# Patient Record
Sex: Female | Born: 1985 | Race: Asian | Hispanic: No | Marital: Married | Smoking: Never smoker
Health system: Southern US, Community
[De-identification: ages and names within clinical notes are randomized; demographics above are authoritative.]

## PROBLEM LIST (undated history)

## (undated) DIAGNOSIS — Z789 Other specified health status: Secondary | ICD-10-CM

## (undated) DIAGNOSIS — K831 Obstruction of bile duct: Secondary | ICD-10-CM

## (undated) DIAGNOSIS — J45909 Unspecified asthma, uncomplicated: Secondary | ICD-10-CM

## (undated) DIAGNOSIS — O26649 Intrahepatic cholestasis of pregnancy, unspecified trimester: Secondary | ICD-10-CM

## (undated) DIAGNOSIS — O26619 Liver and biliary tract disorders in pregnancy, unspecified trimester: Secondary | ICD-10-CM

## (undated) HISTORY — PX: NO PAST SURGERIES: SHX2092

## (undated) HISTORY — DX: Obstruction of bile duct: K83.1

## (undated) HISTORY — DX: Intrahepatic cholestasis of pregnancy, unspecified trimester: O26.649

## (undated) HISTORY — DX: Liver and biliary tract disorders in pregnancy, unspecified trimester: O26.619

---

## 2010-03-07 NOTE — L&D Delivery Note (Signed)
Delivery Note At 12:54 PM a viable, healthy and pre-term female was delivered via Vaginal, Spontaneous Delivery (Presentation: ; Occiput Anterior).  APGAR: 8, 9; weight 3 lb 4.6 oz (1491 g).  NICU team attended delivery and evaluated baby immediately upon delivery.  Placenta status: Intact, SpontaneousPathology.  Cord: 3 vessels with the following complications: None.    Anesthesia: Epidural  Episiotomy: None Lacerations: Periurethral with hemostasis  Suture Repair: Not Indicated Est. Blood Loss (mL): 300 mL   Mom to postpartum.  Baby to NICU.  Clinton Sawyer, Hannalee Castor 12/24/2010, 9:44 AM

## 2010-12-21 ENCOUNTER — Inpatient Hospital Stay (HOSPITAL_COMMUNITY)
Admission: AD | Admit: 2010-12-21 | Discharge: 2010-12-25 | DRG: 775 | Disposition: A | Discharge status: Home / Self Care | Payer: Medicaid Other | Source: Ambulatory Visit | Attending: Family Medicine | Admitting: Family Medicine

## 2010-12-21 ENCOUNTER — Encounter (HOSPITAL_COMMUNITY): Payer: Self-pay | Admitting: *Deleted

## 2010-12-21 ENCOUNTER — Inpatient Hospital Stay (HOSPITAL_COMMUNITY): Payer: Medicaid Other

## 2010-12-21 DIAGNOSIS — K838 Other specified diseases of biliary tract: Secondary | ICD-10-CM | POA: Diagnosis present

## 2010-12-21 DIAGNOSIS — L299 Pruritus, unspecified: Secondary | ICD-10-CM | POA: Diagnosis present

## 2010-12-21 DIAGNOSIS — O4290 Premature rupture of membranes, unspecified as to length of time between rupture and onset of labor, unspecified weeks of gestation: Principal | ICD-10-CM | POA: Diagnosis present

## 2010-12-21 DIAGNOSIS — O4700 False labor before 37 completed weeks of gestation, unspecified trimester: Secondary | ICD-10-CM

## 2010-12-21 DIAGNOSIS — O0930 Supervision of pregnancy with insufficient antenatal care, unspecified trimester: Secondary | ICD-10-CM

## 2010-12-21 DIAGNOSIS — O26899 Other specified pregnancy related conditions, unspecified trimester: Secondary | ICD-10-CM | POA: Diagnosis present

## 2010-12-21 HISTORY — DX: Other specified health status: Z78.9

## 2010-12-21 LAB — COMPREHENSIVE METABOLIC PANEL
AST: 51 U/L — ABNORMAL HIGH (ref 0–37)
Albumin: 2.1 g/dL — ABNORMAL LOW (ref 3.5–5.2)
Chloride: 100 mEq/L (ref 96–112)
Creatinine, Ser: <0.47 mg/dL — ABNORMAL LOW (ref 0.50–1.10)
Potassium: 3.7 mEq/L (ref 3.5–5.1)
Total Bilirubin: 2.3 mg/dL — ABNORMAL HIGH (ref 0.3–1.2)
Total Protein: 6.2 g/dL (ref 6.0–8.3)

## 2010-12-21 LAB — DIFFERENTIAL
Basophils Relative: 1 % (ref 0–1)
Eosinophils Absolute: 0.2 10*3/uL (ref 0.0–0.7)
Monocytes Absolute: 0.6 10*3/uL (ref 0.1–1.0)
Monocytes Relative: 5 % (ref 3–12)

## 2010-12-21 LAB — GLUCOSE TOLERANCE, 1 HOUR: Glucose, 1 Hour GTT: 137 mg/dL (ref 70–140)

## 2010-12-21 LAB — URINALYSIS, ROUTINE W REFLEX MICROSCOPIC
Leukocytes, UA: NEGATIVE
Nitrite: NEGATIVE
Urobilinogen, UA: 0.2 mg/dL (ref 0.0–1.0)

## 2010-12-21 LAB — WET PREP, GENITAL
Clue Cells Wet Prep HPF POC: NONE SEEN
Trich, Wet Prep: NONE SEEN
WBC, Wet Prep HPF POC: NONE SEEN
Yeast Wet Prep HPF POC: NONE SEEN

## 2010-12-21 LAB — CBC
HCT: 37.1 % (ref 36.0–46.0)
Hemoglobin: 12 g/dL (ref 12.0–15.0)
MCH: 26.1 pg (ref 26.0–34.0)
MCHC: 32.3 g/dL (ref 30.0–36.0)
MCV: 80.8 fL (ref 78.0–100.0)

## 2010-12-21 LAB — HIV ANTIBODY (ROUTINE TESTING W REFLEX): HIV: NONREACTIVE

## 2010-12-21 LAB — MAGNESIUM: Magnesium: 7.9 mg/dL (ref 1.5–2.5)

## 2010-12-21 LAB — URINE MICROSCOPIC-ADD ON

## 2010-12-21 MED ORDER — PRENATAL PLUS 27-1 MG PO TABS
1.0000 | ORAL_TABLET | Freq: Every day | ORAL | Status: DC
  Administered 2010-12-22: 1 via ORAL

## 2010-12-21 MED ORDER — LACTATED RINGERS IV SOLN
INTRAVENOUS | Status: DC
  Administered 2010-12-21 – 2010-12-22 (×4): via INTRAVENOUS
  Administered 2010-12-23: 300 mL via INTRAVENOUS

## 2010-12-21 MED ORDER — ACETAMINOPHEN 325 MG PO TABS: 650.0000 mg | ORAL_TABLET | ORAL | Status: DC | PRN

## 2010-12-21 MED ORDER — LACTATED RINGERS IV BOLUS (SEPSIS)
1000.0000 mL | Freq: Once | INTRAVENOUS | Status: AC
  Administered 2010-12-21: 1000 mL via INTRAVENOUS

## 2010-12-21 MED ORDER — BETAMETHASONE SOD PHOS & ACET 6 (3-3) MG/ML IJ SUSP
12.5000 mg | Freq: Once | INTRAMUSCULAR | Status: AC
  Administered 2010-12-21: 12.5 mg via INTRAMUSCULAR
  Filled 2010-12-21: qty 2.1

## 2010-12-21 MED ORDER — MAGNESIUM SULFATE BOLUS VIA INFUSION
4.0000 g | Freq: Once | INTRAVENOUS | Status: AC
  Administered 2010-12-21: 4 g via INTRAVENOUS
  Filled 2010-12-21: qty 500

## 2010-12-21 MED ORDER — ZOLPIDEM TARTRATE 10 MG PO TABS: 10.0000 mg | ORAL_TABLET | Freq: Every evening | ORAL | Status: DC | PRN

## 2010-12-21 MED ORDER — MAGNESIUM SULFATE 40 G IN LACTATED RINGERS - SIMPLE
2.0000 g/h | INTRAVENOUS | Status: DC
  Administered 2010-12-21: 3 g/h via INTRAVENOUS
  Administered 2010-12-21 – 2010-12-22 (×2): 2 g/h via INTRAVENOUS
  Filled 2010-12-21 (×3): qty 500

## 2010-12-21 MED ORDER — BUTORPHANOL TARTRATE 2 MG/ML IJ SOLN
1.0000 mg | Freq: Once | INTRAMUSCULAR | Status: AC
  Administered 2010-12-21: 1 mg via INTRAVENOUS
  Filled 2010-12-21: qty 1

## 2010-12-21 MED ORDER — DOCUSATE SODIUM 100 MG PO CAPS
100.0000 mg | ORAL_CAPSULE | Freq: Every day | ORAL | Status: DC
  Administered 2010-12-22: 100 mg via ORAL

## 2010-12-21 MED ORDER — CALCIUM CARBONATE ANTACID 500 MG PO CHEW: 2.0000 | CHEWABLE_TABLET | ORAL | Status: DC | PRN

## 2010-12-21 MED ORDER — BETAMETHASONE SOD PHOS & ACET 6 (3-3) MG/ML IJ SUSP
12.5000 mg | Freq: Once | INTRAMUSCULAR | Status: AC
  Administered 2010-12-22: 12.5 mg via INTRAMUSCULAR
  Filled 2010-12-21: qty 2.1

## 2010-12-21 NOTE — ED Provider Notes (Signed)
Toy Manichote25 y.o.G1P0 @[redacted]w[redacted]d by LMP Chief Complaint  Patient presents with  . Contractions    HPI  Onset intermittent UCs with red bloody and mucusy discharge yesterday. Presents tonight because UCs became painful at 2200 12/20/10. No LOF. Good FM.  Also having generalized pruritis trunk, arms and legs sometimes severe enough to interrupt sleep since August. [place] couple with hx mostly from husband.  Pregnancy course: NPC. Denies pregnancy problems before yesterday.  Past Medical History  Diagnosis Date  . No pertinent past medical history     Past Surgical History  Procedure Date  . No past surgeries    History   Social History  . Marital Status: Married    Spouse Name: N/A    Number of Children: N/A  . Years of Education: N/A   Occupational History  . Not on file.   Social History Main Topics  . Smoking status: Not on file  . Smokeless tobacco: Not on file  . Alcohol Use: No  . Drug Use: No  . Sexually Active: Yes   Other Topics Concern  . Not on file   Social History Narrative  . No narrative on file   No current facility-administered medications on file prior to encounter.   No current outpatient prescriptions on file prior to encounter.   Allergies not on file  ROS: Pertinent items in HPI  OBJECTIVE  BP 115/80  Pulse 82  Temp(Src) 97.7 F (36.5 C) (Oral)  Resp 18  Ht 5' (1.524 m)  Wt 55.792 kg (123 lb)  BMI 24.02 kg/m2  LMP 06/19/2010  Physical Examination: General appearance - alert, well appearing, and in no distress. Looks in mild discomfort with UCs Eyes - pupils equal and reactive, extraocular eye movements intact Neck - supple, no significant adenopathy, thyroid exam: thyroid is normal in size without nodules or tenderness Lymphatics - no palpable lymphadenopathy Chest - clear to auscultation, no wheezes, rales or rhonchi, symmetric air entry Heart - normal rate, regular rhythm, normal S1, S2, no murmurs, rubs, clicks or  gallops Abdomen - soft, nontender, nondistended, no masses or organomegaly Neurological - alert, oriented, normal speech, no focal findings or movement disorder noted, DTR's normal and symmetric Musculoskeletal - no joint tenderness, deformity or swelling Extremities - peripheral pulses normal, no pedal edema, no clubbing or cyanosis Skin - normal coloration and turgor, no rashes, no suspicious skin lesions noted DERMATITIS NOTED: acne on abdomen, mild excoriations on forearms bilaterally without bleeding  Pelvic:  NEFG, no blood or fluid SSE: scant brown blood in vault. No vag or cx lesion seen. Cx: 1/80/-1 vtx   EFM Toco: Ucs q 2-4 min x 40-60 sec FHR: 140 baseline, 10 bpm accels, mod variability, occ mild deceleration   MAU course: IVF bolus did not impact UCs  ASSESSMENT 1. NPC @ 30.6 wk 2. PTL 3. Generalized pruritis    PLAN Labs sent Admit for Magnesium tocolysis and beta methasone. D/W Dr. Pratt.   

## 2010-12-21 NOTE — ED Provider Notes (Signed)
Chart reviewed and agree with management and plan.  

## 2010-12-21 NOTE — H&P (Signed)
Dawn Manichote25 y.o.G1P0 @[redacted]w[redacted]d  by LMP Chief Complaint  Patient presents with  . Contractions    HPI  Onset intermittent UCs with red bloody and mucusy discharge yesterday. Presents tonight because UCs became painful at 2200 12/20/10. No LOF. Good FM.  Also having generalized pruritis trunk, arms and legs sometimes severe enough to interrupt sleep since August. [place] couple with hx mostly from husband.  Pregnancy course: NPC. Denies pregnancy problems before yesterday.  Past Medical History  Diagnosis Date  . No pertinent past medical history     Past Surgical History  Procedure Date  . No past surgeries    History   Social History  . Marital Status: Married    Spouse Name: N/A    Number of Children: N/A  . Years of Education: N/A   Occupational History  . Not on file.   Social History Main Topics  . Smoking status: Not on file  . Smokeless tobacco: Not on file  . Alcohol Use: No  . Drug Use: No  . Sexually Active: Yes   Other Topics Concern  . Not on file   Social History Narrative  . No narrative on file   No current facility-administered medications on file prior to encounter.   No current outpatient prescriptions on file prior to encounter.   Allergies not on file  ROS: Pertinent items in HPI  OBJECTIVE  BP 115/80  Pulse 82  Temp(Src) 97.7 F (36.5 C) (Oral)  Resp 18  Ht 5' (1.524 m)  Wt 55.792 kg (123 lb)  BMI 24.02 kg/m2  LMP 06/19/2010  Physical Examination: General appearance - alert, well appearing, and in no distress. Looks in mild discomfort with UCs Eyes - pupils equal and reactive, extraocular eye movements intact Neck - supple, no significant adenopathy, thyroid exam: thyroid is normal in size without nodules or tenderness Lymphatics - no palpable lymphadenopathy Chest - clear to auscultation, no wheezes, rales or rhonchi, symmetric air entry Heart - normal rate, regular rhythm, normal S1, S2, no murmurs, rubs, clicks or  gallops Abdomen - soft, nontender, nondistended, no masses or organomegaly Neurological - alert, oriented, normal speech, no focal findings or movement disorder noted, DTR's normal and symmetric Musculoskeletal - no joint tenderness, deformity or swelling Extremities - peripheral pulses normal, no pedal edema, no clubbing or cyanosis Skin - normal coloration and turgor, no rashes, no suspicious skin lesions noted DERMATITIS NOTED: acne on abdomen, mild excoriations on forearms bilaterally without bleeding  Pelvic:  NEFG, no blood or fluid SSE: scant brown blood in vault. No vag or cx lesion seen. Cx: 1/80/-1 vtx   EFM Toco: Ucs q 2-4 min x 40-60 sec FHR: 140 baseline, 10 bpm accels, mod variability, occ mild deceleration   MAU course: IVF bolus did not impact UCs  ASSESSMENT 1. NPC @ 30.6 wk 2. PTL 3. Generalized pruritis    PLAN Labs sent Admit for Magnesium tocolysis and beta methasone. D/W Dr. Shawnie Pons.

## 2010-12-21 NOTE — H&P (Signed)
Chart reviewed and agree with management and plan.  

## 2010-12-21 NOTE — Progress Notes (Signed)
UR Chart review completed.  

## 2010-12-21 NOTE — Progress Notes (Signed)
SUBJECTIVE: Sleeping comfortably. Rec'd Stadol 1 mg before magnesium started.  OBJECTIVE: Filed Vitals:   12/21/10 0700  BP: 101/54  Pulse: 85  Temp:   Resp: 18    EFM: FHR 125, reactive           Toco: UCs diminished to irreg q 6-8  MgSO4 infusing S/P BMZ #1 Glucola was drawn 2 hr 20 min after drink and afrter BMZ was given  [place] report pending Results for orders placed during the hospital encounter of 12/21/10 (from the past 24 hour(s))  URINALYSIS, ROUTINE W REFLEX MICROSCOPIC     Status: Abnormal   Collection Time   12/21/10  2:00 AM      Component Value Range   Color, Urine YELLOW  YELLOW    Appearance CLEAR  CLEAR    Specific Gravity, Urine 1.020  1.005 - 1.030    pH 8.0  5.0 - 8.0    Glucose, UA NEGATIVE  NEGATIVE (mg/dL)   Hgb urine dipstick TRACE (*) NEGATIVE    Bilirubin Urine SMALL (*) NEGATIVE    Ketones, ur NEGATIVE  NEGATIVE (mg/dL)   Protein, ur NEGATIVE  NEGATIVE (mg/dL)   Urobilinogen, UA 0.2  0.0 - 1.0 (mg/dL)   Nitrite NEGATIVE  NEGATIVE    Leukocytes, UA NEGATIVE  NEGATIVE   URINE MICROSCOPIC-ADD ON     Status: Normal   Collection Time   12/21/10  2:00 AM      Component Value Range   Squamous Epithelial / LPF RARE  RARE    RBC / HPF 0-2  <3 (RBC/hpf)   Urine-Other MUCOUS PRESENT    HEPATITIS B SURFACE ANTIGEN     Status: Normal   Collection Time   12/21/10  2:50 AM      Component Value Range   Hepatitis B Surface Ag NEGATIVE  NEGATIVE   RUBELLA SCREEN     Status: Abnormal   Collection Time   12/21/10  2:50 AM      Component Value Range   Rubella 66.4 (*)   RPR     Status: Normal   Collection Time   12/21/10  2:50 AM      Component Value Range   RPR NON REACTIVE  NON REACTIVE   CBC     Status: Abnormal   Collection Time   12/21/10  2:50 AM      Component Value Range   WBC 12.7 (*) 4.0 - 10.5 (K/uL)   RBC 4.59  3.87 - 5.11 (MIL/uL)   Hemoglobin 12.0  12.0 - 15.0 (g/dL)   HCT 16.1  09.6 - 04.5 (%)   MCV 80.8  78.0 - 100.0 (fL)   MCH  26.1  26.0 - 34.0 (pg)   MCHC 32.3  30.0 - 36.0 (g/dL)   RDW 40.9  81.1 - 91.4 (%)   Platelets 352  150 - 400 (K/uL)  DIFFERENTIAL     Status: Abnormal   Collection Time   12/21/10  2:50 AM      Component Value Range   Neutrophils Relative 77  43 - 77 (%)   Neutro Abs 9.7 (*) 1.7 - 7.7 (K/uL)   Lymphocytes Relative 16  12 - 46 (%)   Lymphs Abs 2.0  0.7 - 4.0 (K/uL)   Monocytes Relative 5  3 - 12 (%)   Monocytes Absolute 0.6  0.1 - 1.0 (K/uL)   Eosinophils Relative 2  0 - 5 (%)   Eosinophils Absolute 0.2  0.0 - 0.7 (K/uL)  Basophils Relative 1  0 - 1 (%)   Basophils Absolute 0.1  0.0 - 0.1 (K/uL)  ABO/RH     Status: Normal   Collection Time   12/21/10  2:50 AM      Component Value Range   ABO/RH(D) B POS    HIV ANTIBODY     Status: Normal   Collection Time   12/21/10  2:50 AM      Component Value Range   HIV NON REACTIVE  NON REACTIVE   COMPREHENSIVE METABOLIC PANEL     Status: Abnormal   Collection Time   12/21/10  2:50 AM      Component Value Range   Sodium 135  135 - 145 (mEq/L)   Potassium 3.7  3.5 - 5.1 (mEq/L)   Chloride 100  96 - 112 (mEq/L)   CO2 24  19 - 32 (mEq/L)   Glucose, Bld 91  70 - 99 (mg/dL)   BUN 5 (*) 6 - 23 (mg/dL)   Creatinine, Ser <9.60 (*) 0.50 - 1.10 (mg/dL)   Calcium 9.2  8.4 - 45.4 (mg/dL)   Total Protein 6.2  6.0 - 8.3 (g/dL)   Albumin 2.1 (*) 3.5 - 5.2 (g/dL)   AST 51 (*) 0 - 37 (U/L)   ALT 122 (*) 0 - 35 (U/L)   Alkaline Phosphatase 292 (*) 39 - 117 (U/L)   Total Bilirubin 2.3 (*) 0.3 - 1.2 (mg/dL)   GFR calc non Af Amer NOT CALCULATED  >90 (mL/min)   GFR calc Af Amer NOT CALCULATED  >90 (mL/min)  WET PREP, GENITAL     Status: Normal   Collection Time   12/21/10  3:50 AM      Component Value Range   Yeast, Wet Prep NONE SEEN  NONE SEEN    Trich, Wet Prep NONE SEEN  NONE SEEN    Clue Cells, Wet Prep NONE SEEN  NONE SEEN    WBC, Wet Prep HPF POC NONE SEEN  NONE SEEN   GLUCOSE TOLERANCE, 1 HOUR     Status: Normal   Collection Time    12/21/10  6:25 AM      Component Value Range   Glucose, 1 Hour GTT 137  70 - 140 (mg/dL)  ASSESSMENT: PTL, now with diminished uterine activity. PLAN: Continue present management. Dr. Shawnie Pons aware

## 2010-12-21 NOTE — Progress Notes (Signed)
Pt reports she has been having contractions since yesterday, a little bleeding.

## 2010-12-22 ENCOUNTER — Encounter (HOSPITAL_COMMUNITY): Payer: Self-pay | Admitting: *Deleted

## 2010-12-22 LAB — URINE DRUGS OF ABUSE SCREEN W ALC, ROUTINE (REF LAB)
Amphetamine Screen, Ur: NEGATIVE
Barbiturate Quant, Ur: NEGATIVE
Benzodiazepines.: NEGATIVE
Cocaine Metabolites: NEGATIVE
Creatinine,U: 24.3 mg/dL
Ethyl Alcohol: <10 mg/dL
Marijuana Metabolite: NEGATIVE
Methadone: NEGATIVE
Opiate Screen, Urine: NEGATIVE
Phencyclidine (PCP): NEGATIVE
Propoxyphene: NEGATIVE

## 2010-12-22 LAB — URINE CULTURE
Colony Count: NO GROWTH
Culture: NO GROWTH

## 2010-12-22 LAB — GC/CHLAMYDIA PROBE AMP, URINE
Chlamydia, Swab/Urine, PCR: NEGATIVE
GC Probe Amp, Urine: NEGATIVE

## 2010-12-22 LAB — HEMOGLOBINOPATHY EVALUATION
Hemoglobin Other: 0 % (ref 0.0–0.0)
Hgb A: 97.1 % (ref 96.8–97.8)

## 2010-12-22 MED ORDER — SODIUM CHLORIDE 0.9 % IV SOLN
2.0000 g | Freq: Four times a day (QID) | INTRAVENOUS | Status: DC
  Administered 2010-12-22: 2 g via INTRAVENOUS
  Filled 2010-12-22 (×2): qty 2000

## 2010-12-22 MED ORDER — ONDANSETRON HCL 4 MG/2ML IJ SOLN: 4.0000 mg | Freq: Four times a day (QID) | INTRAMUSCULAR | Status: DC | PRN

## 2010-12-22 MED ORDER — AMPICILLIN 500 MG PO CAPS: 500.0000 mg | ORAL_CAPSULE | Freq: Three times a day (TID) | ORAL | Status: DC

## 2010-12-22 MED ORDER — DEXTROSE 5 % IV SOLN
5.0000 10*6.[IU] | Freq: Once | INTRAVENOUS | Status: AC
  Administered 2010-12-22: 5 10*6.[IU] via INTRAVENOUS
  Filled 2010-12-22: qty 5

## 2010-12-22 MED ORDER — AMPICILLIN SODIUM 2 G IJ SOLR: 2.0000 g | Freq: Four times a day (QID) | INTRAMUSCULAR | Status: DC

## 2010-12-22 MED ORDER — PENICILLIN G POTASSIUM 5000000 UNITS IJ SOLR
2.5000 10*6.[IU] | INTRAVENOUS | Status: DC
  Administered 2010-12-23 (×3): 2.5 10*6.[IU] via INTRAVENOUS
  Filled 2010-12-22 (×7): qty 2.5

## 2010-12-22 MED ORDER — ONDANSETRON HCL 4 MG/2ML IJ SOLN
INTRAMUSCULAR | Status: AC
  Administered 2010-12-22: 04:00:00
  Filled 2010-12-22: qty 2

## 2010-12-22 MED ORDER — ERYTHROMYCIN LACTOBIONATE 500 MG IV SOLR: 500.0000 mg | Freq: Four times a day (QID) | INTRAVENOUS | Status: DC

## 2010-12-22 MED ORDER — SODIUM CHLORIDE 0.9 % IV SOLN
500.0000 mg | Freq: Four times a day (QID) | INTRAVENOUS | Status: DC
  Administered 2010-12-22: 500 mg via INTRAVENOUS
  Filled 2010-12-22 (×2): qty 500

## 2010-12-22 MED ORDER — NALBUPHINE SYRINGE 5 MG/0.5 ML
5.0000 mg | INJECTION | INTRAMUSCULAR | Status: DC | PRN
  Administered 2010-12-23: 5 mg via INTRAVENOUS
  Filled 2010-12-22 (×3): qty 0.5

## 2010-12-22 MED ORDER — ERYTHROMYCIN BASE 250 MG PO TBEC: 500.0000 mg | DELAYED_RELEASE_TABLET | Freq: Three times a day (TID) | ORAL | Status: DC

## 2010-12-22 NOTE — Progress Notes (Signed)
  Patient is reporting increase in her pain. Continues to leak fluid. COntinues to contract. Dilation: 2 Effacement (%): 100 Station: -1 Presentation: Vertex Exam by:: Dr Natale Milch  FHTs: 120s-130s; + Accels, possible early decels; contractions q2-5 min  A/P PPROM, laboring. Will move to L&D and start PCN. NICU notified.

## 2010-12-22 NOTE — Progress Notes (Signed)
SW received consult to meet with patient due to [facility].  SW met with patient and asked if she would like SW to get an interpreter.  She spoke [place] and said that SW could talk with her.  SW verified understanding by having patient repeat information back to SW.  She states she did not have PNC due to lack of insurance.  She has met with [facility] financial counselor to apply for emergency Medicaid.  SW explained to patient that if she is discharged from the hospital before baby is born, she will become a clinic patient and given an appointment before leaving the hospital.  Patient states that she has transportation from her husband or friends to get to appointments.  SW offered bus passes just in case she did not have transportation and she accepted.  SW will give her 2 bus passes and explained that she may request more if needed.  SW gave contact information and asked her to have her husband call me if there is anything SW needs to clarify or assist with.

## 2010-12-22 NOTE — Progress Notes (Signed)
Communication with pt. [facility] [place] - telephone interpreter.

## 2010-12-22 NOTE — Progress Notes (Signed)
Dr. Natale Milch to the bedside - positive fern; orders received - will not send amnisure to lab.

## 2010-12-22 NOTE — Progress Notes (Addendum)
Called to pt.'s room, RLQ discomfort, pt. Voided/ possible SROM on towel (between her legs).  Amniotest blue/yellow.  Dr. Despina Hidden notified; Dr. Natale Milch will come to the unit to evaluate patient.  FHR 130's, no vagional bleeding noted, yellowish color fluid (urine?).

## 2010-12-22 NOTE — Progress Notes (Addendum)
  S. Contraction pain decreased today compared to yesterday. She denies VB or ROM.   O. VSS, AF, RR 20      FHR- reassuring      CTX- now q 8 minutes      DTR-0      Abd- gravid, benign  A/P. PTL at 31.1 weeks with no measurable cervix on u/s- She is now status post BMZ and is on 2 grams per hour of magnesium sulfate.  She is stable at present.

## 2010-12-22 NOTE — Progress Notes (Signed)
Dr. Natale Milch at the bedside for spec. Exam and amnisure test.

## 2010-12-22 NOTE — Progress Notes (Signed)
Transferred to labor and delivery,room 160

## 2010-12-22 NOTE — Progress Notes (Signed)
Patient felt "automatic" leakage of fluid, and is unsure if it was urine or water breaking.  Speculum exam revealed pooling of light green/darker yellow fluid and some spotting noted on cervix. Sample taken with sterile swab and fern was positive. Cervix by visual exam was 1 cm; no digital exam was performed.  US showed head down at bedside.  A/P Preterm, Premature ROM 1. Stop Magnesium 2. Start Amp 2 grams IV q6 x 2 days followed by 500mg  q8 x 5 days 3. Start Erythromycin IV 500mg  q6 x 2 days followed by 500mg  q8 x 5 days 4. GBS is pending 5. Discussed with patient that if she develops infection or continues to go into labor, she will be delivered and NICU will be attending delivery; infant will likely have a NICU stay.  Used [employer]

## 2010-12-22 NOTE — Progress Notes (Signed)
Pt. Off monitor to BR for void.  

## 2010-12-23 ENCOUNTER — Inpatient Hospital Stay (HOSPITAL_COMMUNITY): Payer: Medicaid Other | Admitting: Anesthesiology

## 2010-12-23 ENCOUNTER — Other Ambulatory Visit: Payer: Self-pay | Admitting: Family Medicine

## 2010-12-23 ENCOUNTER — Encounter (HOSPITAL_COMMUNITY): Payer: Self-pay | Admitting: *Deleted

## 2010-12-23 ENCOUNTER — Encounter (HOSPITAL_COMMUNITY): Payer: Self-pay | Admitting: Anesthesiology

## 2010-12-23 DIAGNOSIS — O4700 False labor before 37 completed weeks of gestation, unspecified trimester: Secondary | ICD-10-CM

## 2010-12-23 LAB — CBC
HCT: 32.6 % — ABNORMAL LOW (ref 36.0–46.0)
MCHC: 32.8 g/dL (ref 30.0–36.0)
MCV: 80.3 fL (ref 78.0–100.0)
Platelets: 373 10*3/uL (ref 150–400)
RDW: 14.4 % (ref 11.5–15.5)
WBC: 13.3 10*3/uL — ABNORMAL HIGH (ref 4.0–10.5)

## 2010-12-23 MED ORDER — LIDOCAINE HCL (PF) 1 % IJ SOLN
30.0000 mL | INTRAMUSCULAR | Status: DC | PRN
  Filled 2010-12-23: qty 30

## 2010-12-23 MED ORDER — LACTATED RINGERS IV SOLN
500.0000 mL | INTRAVENOUS | Status: DC | PRN
  Administered 2010-12-23: 500 mL via INTRAVENOUS
  Administered 2010-12-23: 300 mL via INTRAVENOUS

## 2010-12-23 MED ORDER — OXYTOCIN BOLUS FROM INFUSION
500.0000 mL | Freq: Once | INTRAVENOUS | Status: DC
  Filled 2010-12-23: qty 1000
  Filled 2010-12-23: qty 500

## 2010-12-23 MED ORDER — PRENATAL PLUS 27-1 MG PO TABS
1.0000 | ORAL_TABLET | Freq: Every day | ORAL | Status: DC
  Administered 2010-12-24 – 2010-12-25 (×2): 1 via ORAL
  Filled 2010-12-23 (×2): qty 1

## 2010-12-23 MED ORDER — OXYTOCIN 20 UNITS IN LACTATED RINGERS INFUSION - SIMPLE
1.0000 m[IU]/min | INTRAVENOUS | Status: DC
  Administered 2010-12-23: 333 m[IU]/min via INTRAVENOUS
  Administered 2010-12-23: 1 m[IU]/min via INTRAVENOUS
  Filled 2010-12-23: qty 1000

## 2010-12-23 MED ORDER — SIMETHICONE 80 MG PO CHEW
80.0000 mg | CHEWABLE_TABLET | ORAL | Status: DC | PRN
  Administered 2010-12-25: 80 mg via ORAL

## 2010-12-23 MED ORDER — DIPHENHYDRAMINE HCL 25 MG PO CAPS
25.0000 mg | ORAL_CAPSULE | Freq: Four times a day (QID) | ORAL | Status: DC | PRN
  Administered 2010-12-24 – 2010-12-25 (×3): 25 mg via ORAL
  Filled 2010-12-23 (×3): qty 1

## 2010-12-23 MED ORDER — FENTANYL 2.5 MCG/ML BUPIVACAINE 1/10 % EPIDURAL INFUSION (WH - ANES)
INTRAMUSCULAR | Status: DC | PRN
  Administered 2010-12-23: 12 mL/h via EPIDURAL

## 2010-12-23 MED ORDER — CITRIC ACID-SODIUM CITRATE 334-500 MG/5ML PO SOLN: 30.0000 mL | ORAL | Status: DC | PRN

## 2010-12-23 MED ORDER — DIPHENHYDRAMINE HCL 50 MG/ML IJ SOLN: 12.5000 mg | INTRAMUSCULAR | Status: DC | PRN

## 2010-12-23 MED ORDER — ONDANSETRON HCL 4 MG PO TABS: 4.0000 mg | ORAL_TABLET | ORAL | Status: DC | PRN

## 2010-12-23 MED ORDER — SENNOSIDES-DOCUSATE SODIUM 8.6-50 MG PO TABS
2.0000 | ORAL_TABLET | Freq: Every day | ORAL | Status: DC
Start: 2010-12-23 — End: 2010-12-25
  Administered 2010-12-23: 2 via ORAL

## 2010-12-23 MED ORDER — LACTATED RINGERS IV SOLN
INTRAVENOUS | Status: DC
  Administered 2010-12-23 (×2): via INTRAVENOUS

## 2010-12-23 MED ORDER — WITCH HAZEL-GLYCERIN EX PADS: 1.0000 "application " | MEDICATED_PAD | CUTANEOUS | Status: DC | PRN

## 2010-12-23 MED ORDER — LACTATED RINGERS IV SOLN: 500.0000 mL | Freq: Once | INTRAVENOUS | Status: DC

## 2010-12-23 MED ORDER — EPHEDRINE 5 MG/ML INJ
10.0000 mg | INTRAVENOUS | Status: DC | PRN
  Filled 2010-12-23 (×2): qty 4

## 2010-12-23 MED ORDER — OXYCODONE-ACETAMINOPHEN 5-325 MG PO TABS: 1.0000 | ORAL_TABLET | ORAL | Status: DC | PRN

## 2010-12-23 MED ORDER — EPHEDRINE 5 MG/ML INJ
10.0000 mg | INTRAVENOUS | Status: DC | PRN
  Filled 2010-12-23: qty 4

## 2010-12-23 MED ORDER — PHENYLEPHRINE 40 MCG/ML (10ML) SYRINGE FOR IV PUSH (FOR BLOOD PRESSURE SUPPORT)
80.0000 ug | PREFILLED_SYRINGE | INTRAVENOUS | Status: DC | PRN
  Filled 2010-12-23 (×2): qty 5

## 2010-12-23 MED ORDER — TETANUS-DIPHTH-ACELL PERTUSSIS 5-2.5-18.5 LF-MCG/0.5 IM SUSP
0.5000 mL | Freq: Once | INTRAMUSCULAR | Status: AC
  Administered 2010-12-24: 0.5 mL via INTRAMUSCULAR
  Filled 2010-12-23: qty 0.5

## 2010-12-23 MED ORDER — LANOLIN HYDROUS EX OINT: TOPICAL_OINTMENT | CUTANEOUS | Status: DC | PRN

## 2010-12-23 MED ORDER — IBUPROFEN 600 MG PO TABS
600.0000 mg | ORAL_TABLET | Freq: Four times a day (QID) | ORAL | Status: DC
  Administered 2010-12-23 – 2010-12-25 (×8): 600 mg via ORAL
  Filled 2010-12-23 (×8): qty 1

## 2010-12-23 MED ORDER — OXYTOCIN 20 UNITS IN LACTATED RINGERS INFUSION - SIMPLE: 125.0000 mL/h | Freq: Once | INTRAVENOUS | Status: DC

## 2010-12-23 MED ORDER — DIBUCAINE 1 % RE OINT: 1.0000 "application " | TOPICAL_OINTMENT | RECTAL | Status: DC | PRN

## 2010-12-23 MED ORDER — IBUPROFEN 600 MG PO TABS: 600.0000 mg | ORAL_TABLET | Freq: Four times a day (QID) | ORAL | Status: DC | PRN

## 2010-12-23 MED ORDER — ZOLPIDEM TARTRATE 5 MG PO TABS: 5.0000 mg | ORAL_TABLET | Freq: Every evening | ORAL | Status: DC | PRN

## 2010-12-23 MED ORDER — PHENYLEPHRINE 40 MCG/ML (10ML) SYRINGE FOR IV PUSH (FOR BLOOD PRESSURE SUPPORT)
80.0000 ug | PREFILLED_SYRINGE | INTRAVENOUS | Status: DC | PRN
  Filled 2010-12-23: qty 5

## 2010-12-23 MED ORDER — ONDANSETRON HCL 4 MG/2ML IJ SOLN: 4.0000 mg | Freq: Four times a day (QID) | INTRAMUSCULAR | Status: DC | PRN

## 2010-12-23 MED ORDER — BENZOCAINE-MENTHOL 20-0.5 % EX AERO
INHALATION_SPRAY | CUTANEOUS | Status: AC
  Filled 2010-12-23: qty 56

## 2010-12-23 MED ORDER — BENZOCAINE-MENTHOL 20-0.5 % EX AERO: 1.0000 "application " | INHALATION_SPRAY | CUTANEOUS | Status: DC | PRN

## 2010-12-23 MED ORDER — TERBUTALINE SULFATE 1 MG/ML IJ SOLN: 0.2500 mg | Freq: Once | INTRAMUSCULAR | Status: DC | PRN

## 2010-12-23 MED ORDER — FLEET ENEMA 7-19 GM/118ML RE ENEM: 1.0000 | ENEMA | RECTAL | Status: DC | PRN

## 2010-12-23 MED ORDER — LIDOCAINE HCL 1.5 % IJ SOLN
INTRAMUSCULAR | Status: DC | PRN
  Administered 2010-12-23: 3 mL via INTRADERMAL
  Administered 2010-12-23: 4 mL via INTRADERMAL

## 2010-12-23 MED ORDER — FENTANYL 2.5 MCG/ML BUPIVACAINE 1/10 % EPIDURAL INFUSION (WH - ANES)
14.0000 mL/h | INTRAMUSCULAR | Status: DC
  Administered 2010-12-23: 14 mL/h via EPIDURAL
  Filled 2010-12-23 (×2): qty 60

## 2010-12-23 MED ORDER — ONDANSETRON HCL 4 MG/2ML IJ SOLN: 4.0000 mg | INTRAMUSCULAR | Status: DC | PRN

## 2010-12-23 NOTE — Progress Notes (Signed)
Dawn Haney is a 25 y.o. G1P0000 at [redacted]w[redacted]d by ultrasound admitted for Preterm labor, had ROM 10/17 around 1700 and began labor prior to midnight; she was transferred to L&D. Has epidural. No complaints of pain. Continues to leak meconium stained fluid.  Objective: BP 104/65  Pulse 59  Temp(Src) 98.5 F (36.9 C) (Oral)  Resp 18  Ht 5' (1.524 m)  Wt 54.568 kg (120 lb 4.8 oz)  BMI 23.49 kg/m2  SpO2 100%  LMP 05/19/2010 I/O last 3 completed shifts: In: 3378 [P.O.:1315; I.V.:2063] Out: 4175 [Urine:4175]    FHT:  FHR: 130s bpm, variability: moderate,  accelerations:  Present,  decelerations:  Present lates vs early's UC:   regular, every 3-5 minutes SVE:   Dilation: 4.5 Effacement (%): 100 Station: 0;+1 Exam by:: Dr. Natale Milch  Labs: Lab Results  Component Value Date   WBC 13.3* 12/23/2010   HGB 10.7* 12/23/2010   HCT 32.6* 12/23/2010   MCV 80.3 12/23/2010   PLT 373 12/23/2010    Assessment / Plan: Preterm labor, progressing. Has had cervical change, not on any augmentation. BBOW palpated with cervical exam. Fetal Wellbeing:  Category II Pain Control:  Epidural I/D:  PCN for GBS unk <[redacted] wks EGA Anticipated MOD:  NSVD  Becky Berberian N 12/23/2010, 7:52 AM

## 2010-12-23 NOTE — Anesthesia Preprocedure Evaluation (Signed)
Anesthesia Evaluation  Name, MR# and DOB Patient awake  General Assessment Comment  Reviewed: Allergy & Precautions, H&P , Patient's Chart, lab work & pertinent test results  Airway Mallampati: III TM Distance: >3 FB Neck ROM: full    Dental No notable dental hx. (+) Teeth Intact   Pulmonary  clear to auscultation  Pulmonary exam normal       Cardiovascular regular Normal    Neuro/Psych Negative Neurological ROS  Negative Psych ROS   GI/Hepatic negative GI ROS Neg liver ROS    Endo/Other  Negative Endocrine ROS  Renal/GU negative Renal ROS  Genitourinary negative   Musculoskeletal   Abdominal   Peds  Hematology negative hematology ROS (+)   Anesthesia Other Findings   Reproductive/Obstetrics (+) Pregnancy                           Anesthesia Physical Anesthesia Plan  ASA: II  Anesthesia Plan: Epidural   Post-op Pain Management:    Induction:   Airway Management Planned:   Additional Equipment:   Intra-op Plan:   Post-operative Plan:   Informed Consent: I have reviewed the patients History and Physical, chart, labs and discussed the procedure including the risks, benefits and alternatives for the proposed anesthesia with the patient or authorized representative who has indicated his/her understanding and acceptance.   Dental Advisory Given  Plan Discussed with: Anesthesiologist  Anesthesia Plan Comments:         Anesthesia Quick Evaluation

## 2010-12-23 NOTE — Consult Note (Signed)
Asked to provide prenatal consultation for 25 y.o. G1 who was admitted 10/15 with preterm labor and pruritis, given antibiotics and betamethasone (2nd dose 5 am on 10/17), and had PROM (clear) today, now @ 31.[redacted] wks EGA.  No signs of infection but labor now progressing and no further tocolysis is planned.  Pregnancy previously uncomplicated..  Discussed usual expectations for preterm infant at 31+ weeks, including possible needs for DR resuscitation, respiratory support, IV access, and temp support.  Projected possible length of stay in NICU until 36 - [redacted] wks EGA.  Discussed desire to feed infant with mother's milk, and encouraged her to consider pumping postnatally.  Patient was attentive but communication difficult due to cultural and language barriers.  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  Few questions other than those about visitation and about overall outcome.  I explained that most preterm babies at 30+ wks EGA do very well.  They were very appreciative of my input.  Thank you for the consultation.

## 2010-12-23 NOTE — Progress Notes (Signed)
Dawn Haney is a 25 y.o. G1P0000 at [redacted]w[redacted]d by LMP admitted for Preterm labor, PROM  Subjective: Not feeling contractions.  Has epidural.  Meconium fluid with SROM earlier this morning.      Objective: BP 89/50  Pulse 65  Temp(Src) 98.5 F (36.9 C) (Oral)  Resp 18  Ht 5' (1.524 m)  Wt 54.568 kg (120 lb 4.8 oz)  BMI 23.49 kg/m2  SpO2 100%  LMP 05/19/2010 I/O last 3 completed shifts: In: 3378 [P.O.:1315; I.V.:2063] Out: 4175 [Urine:4175]    FHT:  FHR: 130-140s bpm, variability: moderate,  accelerations:  Present,  decelerations:  Present variable UC:   regular, every 5 minutes SVE:   Dilation: 5.5 Effacement (%): 100 Station: 0;+1 Exam by:: Dr. Natale Milch  Labs: Lab Results  Component Value Date   WBC 13.3* 12/23/2010   HGB 10.7* 12/23/2010   HCT 32.6* 12/23/2010   MCV 80.3 12/23/2010   PLT 373 12/23/2010    Assessment / Plan: preterm labor, SROM with mec.    Labor: Due to SROM with meconium, will procede with augmentation of labor.  FOrebag felt, which was ruptured with light to moderate meconium.  WIll start pitocin Fetal Wellbeing:  Category II Pain Control:  Epidural I/D:  penicillin as GBS pending. Anticipated MOD:  NSVD  STINSON, JACOB JEHIEL 12/23/2010, 11:31 AM

## 2010-12-23 NOTE — Progress Notes (Signed)
Dawn Haney is a 25 y.o. G1P0000 at [redacted]w[redacted]d by ultrasound admitted for active labor  Subjective:   Objective: BP 96/53  Pulse 58  Temp(Src) 98.5 F (36.9 C) (Oral)  Resp 18  Ht 5' (1.524 m)  Wt 54.568 kg (120 lb 4.8 oz)  BMI 23.49 kg/m2  SpO2 100%  LMP 05/19/2010 I/O last 3 completed shifts: In: 5725 [P.O.:2155; I.V.:3570] Out: 5425 [Urine:5175; Emesis/NG output:250]    FHT:  FHR: 120s bpm, variability: moderate,  accelerations:  Present,  decelerations:  Present earlys vs lates; contractions not tracing UC:   Not tracing well at all times; about 8 contractions/hour SVE:   Dilation: 4 Effacement (%): 100 Station: 0 Exam by:: Dr. Natale Haney  Labs: Lab Results  Component Value Date   WBC 13.3* 12/23/2010   HGB 10.7* 12/23/2010   HCT 32.6* 12/23/2010   MCV 80.3 12/23/2010   PLT 373 12/23/2010    Assessment / Plan: Spontaneous labor, progressing normally, preterm  Labor: Progressing normally Fetal Wellbeing:  Category II Pain Control:  Stadol, now has epidural I/D:  PCN for GBS unknown <[redacted] wks EGA Anticipated MOD:  NSVD  [name] 12/23/2010, 4:43 AM

## 2010-12-23 NOTE — Anesthesia Procedure Notes (Signed)
Epidural Patient location during procedure: OB Start time: 12/23/2010 3:43 AM  Staffing Anesthesiologist: Lyndsy Gilberto A. Performed by: anesthesiologist   Preanesthetic Checklist Completed: patient identified, site marked, surgical consent, pre-op evaluation, timeout performed, IV checked, risks and benefits discussed and monitors and equipment checked  Epidural Patient position: sitting Prep: site prepped and draped and DuraPrep Patient monitoring: continuous pulse ox and blood pressure Approach: midline Injection technique: LOR air  Needle:  Needle type: Tuohy  Needle gauge: 17 G Needle length: 9 cm Needle insertion depth: 4 cm Catheter type: closed end flexible Catheter size: 19 Gauge Catheter at skin depth: 8 cm Test dose: negative and 1.5% lidocaine  Assessment Events: blood not aspirated, injection not painful, no injection resistance, negative IV test and no paresthesia  Additional Notes Patient is more comfortable after epidural dosed. Please see RN's note for documentation of vital signs and FHR which are stable.

## 2010-12-24 LAB — URINALYSIS, ROUTINE W REFLEX MICROSCOPIC
Bilirubin Urine: NEGATIVE
Ketones, ur: NEGATIVE mg/dL
Leukocytes, UA: NEGATIVE
Nitrite: NEGATIVE
Protein, ur: NEGATIVE mg/dL
Urobilinogen, UA: 0.2 mg/dL (ref 0.0–1.0)

## 2010-12-24 LAB — URINE MICROSCOPIC-ADD ON

## 2010-12-24 LAB — HEPATITIS C ANTIBODY (REFLEX): HCV Ab: NEGATIVE

## 2010-12-24 LAB — COMPREHENSIVE METABOLIC PANEL
AST: 147 U/L — ABNORMAL HIGH (ref 0–37)
AST: 86 U/L — ABNORMAL HIGH (ref 0–37)
Albumin: 2 g/dL — ABNORMAL LOW (ref 3.5–5.2)
Albumin: 2 g/dL — ABNORMAL LOW (ref 3.5–5.2)
BUN: 7 mg/dL (ref 6–23)
BUN: 7 mg/dL (ref 6–23)
Calcium: 9.7 mg/dL (ref 8.4–10.5)
Chloride: 104 mEq/L (ref 96–112)
Creatinine, Ser: 0.55 mg/dL (ref 0.50–1.10)
Creatinine, Ser: 0.51 mg/dL (ref 0.50–1.10)
Potassium: 4 mEq/L (ref 3.5–5.1)
Total Bilirubin: 1.2 mg/dL (ref 0.3–1.2)
Total Bilirubin: 1.7 mg/dL — ABNORMAL HIGH (ref 0.3–1.2)
Total Protein: 5.5 g/dL — ABNORMAL LOW (ref 6.0–8.3)
Total Protein: 6 g/dL (ref 6.0–8.3)

## 2010-12-24 MED ORDER — HYDROMORPHONE HCL 2 MG PO TABS
1.0000 mg | ORAL_TABLET | Freq: Four times a day (QID) | ORAL | Status: DC | PRN
  Administered 2010-12-24: 1 mg via ORAL
  Filled 2010-12-24: qty 1

## 2010-12-24 NOTE — Progress Notes (Signed)
Post Partum Day 1 Subjective: up ad lib, voiding, tolerating PO and + flatus. Patient still complains of itching in extremities. She has been able to pump a little breast milk and hasn't experienced any complications thus far.  Objective: Temp:  [97.3 F (36.3 C)-98.5 F (36.9 C)] 97.3 F (36.3 C) (10/19 0530) Pulse Rate:  [49-75] 49  (10/19 0530) Resp:  [16-20] 16  (10/19 0530) BP: (89-108)/(45-72) 101/68 mmHg (10/19 0530) SpO2:  [96 %-98 %] 98 % (10/19 0530)   Physical Exam:  General: alert, cooperative and no distress Lochia: appropriate Uterine Fundus: firm DVT Evaluation: No evidence of DVT seen on physical exam. Negative Homan's sign. No cords or calf tenderness. No significant calf/ankle edema.   Basename 12/23/10 0305  HGB 10.7*  HCT 32.6*    Assessment/Plan: Plan for discharge tomorrow and Breastfeeding - Generalized Itching of Several Months Durations: Give Benadryl for symptoms. Repeat CMET this AM as LFT's and T. Bili were both elevated on 12/21/10.     LOS: 3 days   Janeece Riggers 12/24/2010, 8:57 AM   I have read and agree with above.  Si Raider Clinton Sawyer

## 2010-12-24 NOTE — Progress Notes (Signed)
PSYCHOSOCIAL ASSESSMENT ~ MATERNAL/CHILD Name: Dawn Haney                                                                                      Age: 24 day   Referral Date: 12/24/10 Reason/Source: NICU Support. NPCN/NICU  I. FAMILY/HOME ENVIRONMENT A. Child's Legal Guardian _x__Parent(s) ___Grandparent ___Foster parent ___DSS_________________ Name: Tye Savoy                             DOB: 01-Jan-1986           Age: 56   Address: [address], [place], [place], [address]  Name: Varney Daily                             DOB: //                     Age:   Address: same  B. Other Household Members/Support Persons Name:                                         Relationship:                        DOB ___/___/___                   Name:                                         Relationship:                        DOB ___/___/___                   Name:                                         Relationship:                        DOB ___/___/___                   Name:                                         Relationship:                        DOB ___/___/___  C. Other Support: good supports   II. PSYCHOSOCIAL DATA A. Information Source  _x_Patient Interview  _x_Family Interview           _x_Other___________  B. Event organiser _x_Employment: FOB works for [employer] _x_Medicaid    [place]                 __Private Insurance:                   __Self Pay  __Food Stamps   __WIC __[employer] First     __Public Housing     __Section 8    __Maternity Care Coordination/Child Service Coordination/Early Intervention  __School:                                                                         Grade:  __Other:   Salena Saner Cultural and Environment Information Cultural Issues Impacting Care: Parents speak English, but it is not their first  language.  FOB seems more comfortable with English than MOB does.  III. STRENGTHS _x__Supportive family/friends _x__Adequate Resources _x__Compliance with medical plan _x__Home prepared for Child (including basic supplies) _x__Understanding of illness      _x__Other: Parents would like to take baby to [facility] for follow up. IV. RISK FACTORS AND CURRENT PROBLEMS         __x__No Problems Noted                                                                                                                                                                                                                                       Pt              Family     Substance Abuse                                                                ___  ___        Mental Illness                                                                        ___              ___  Family/Relationship Issues                                      ___               ___             Abuse/Neglect/Domestic Violence                                         ___         ___  Financial Resources                                        ___              ___             Transportation                                                                        ___               ___  DSS Involvement                                                                   ___              ___  Adjustment to Illness                                                               ___              ___  Knowledge/Cognitive Deficit                                                   ___  ___             Compliance with Treatment                                                 ___              ___  Basic Needs (food, housing, etc.)                                          ___              ___             Housing Concerns                                       ___               ___ Other_____________________________________________________________            V. SOCIAL WORK ASSESSMENT SW initially met with MOB in Antenatal earlier this week for referral of NPNC.  MOB did not have PNC due to lack of insurance.  She has met with [facility] financial counselor to apply for emergency Medicaid for herself and has now met with her again since baby has been born.  FOB was present during this meeting with SW and both parents are extremely pleasant.  SW asked if they would like SW to get an interpreter and they both said they would be okay without one.  SW informed them that if they wish to have an interpreter present in the hospital at any time, they can let SW or any staff person know.  They report having good supports and no issues with transportation once MOB is discharged.  FOB states they will be able to obtain necessary baby supplies and SW asked him to let SW know if they need assistance.  SW asked if they have chosen a pediatrician and they informed SW that they had not.  SW offered a list of pediatricians and explained that the Health Department will always take a patient with Medicaid.  SW informed them of the two locations and FOB decided that they would like to go to [facility].  SW informed Amy Jobe/Neonatal follow up.  FOB states no other questions or needs at this time.  MOB smiled a lot and both thanked SW for visiting.  SW explained support services offered by NICU SWs and gave contact information.  VI. SOCIAL WORK PLAN  ___No Further Intervention Required/No Barriers to Discharge   _x__Psychosocial Support and Ongoing Assessment of Needs   ___Patient/Family Education:   ___Child Protective Services Report   County___________ Date___/____/____   ___Information/Referral to [employer]_________________________   ___Other:

## 2010-12-24 NOTE — Anesthesia Postprocedure Evaluation (Signed)
  Anesthesia Post-op Note  Patient: Dawn Haney  Procedure(s) Performed: * Lumbar Epidural for L&D*  Patient Location: Women's Unit  Anesthesia Type: Epidural  Level of Consciousness: awake, alert  and oriented  Airway and Oxygen Therapy: Patient Spontanous Breathing  Post-op Pain: 0 /10  Post-op Assessment: Post-op Vital signs reviewed, Patient's Cardiovascular Status Stable, Respiratory Function Stable, Patent Airway, No signs of Nausea or vomiting, Adequate PO intake, Pain level controlled, No headache, No backache, No residual numbness and No residual motor weakness  Post-op Vital Signs: Reviewed and stable  Complications: No apparent anesthesia complications

## 2010-12-25 ENCOUNTER — Encounter (HOSPITAL_COMMUNITY)
Admission: RE | Admit: 2010-12-25 | Discharge: 2010-12-25 | Disposition: A | Discharge status: Home / Self Care | Payer: Self-pay | Source: Ambulatory Visit | Attending: Family Medicine | Admitting: Family Medicine

## 2010-12-25 DIAGNOSIS — O923 Agalactia: Secondary | ICD-10-CM | POA: Insufficient documentation

## 2010-12-25 LAB — HEPATIC FUNCTION PANEL
ALT: 180 U/L — ABNORMAL HIGH (ref 0–35)
AST: 68 U/L — ABNORMAL HIGH (ref 0–37)
Albumin: 1.9 g/dL — ABNORMAL LOW (ref 3.5–5.2)
Alkaline Phosphatase: 237 U/L — ABNORMAL HIGH (ref 39–117)
Total Protein: 5.3 g/dL — ABNORMAL LOW (ref 6.0–8.3)

## 2010-12-25 LAB — CULTURE, BETA STREP (GROUP B ONLY)

## 2010-12-25 LAB — HEPATITIS A ANTIBODY, IGM: Hep A IgM: NEGATIVE

## 2010-12-25 MED ORDER — DIPHENHYDRAMINE HCL 25 MG PO CAPS: 25.0000 mg | ORAL_CAPSULE | Freq: Four times a day (QID) | ORAL | Status: AC | PRN

## 2010-12-25 MED ORDER — IBUPROFEN 600 MG PO TABS: 600.0000 mg | ORAL_TABLET | Freq: Four times a day (QID) | ORAL | Status: AC

## 2010-12-25 NOTE — Discharge Summary (Signed)
Obstetric Discharge Summary Reason for Admission: Preterm labor at [redacted]w[redacted]d, generalized pruritus, no prenatal care Prenatal Procedures: NST and ultrasound Intrapartum Procedures: spontaneous vaginal delivery and GBS prophylaxis. SVD at [redacted]w[redacted]d. Postpartum Procedures: none Complications-Operative and Postpartum: none Hemoglobin  Date Value Range Status  12/23/2010 10.7* 12.0-15.0 (g/dL) Final     HCT  Date Value Range Status  12/23/2010 32.6* 36.0-46.0 (%) Final   12/21/10 Bile acids 72 (was resulted 12/24/10)  Lab Results  Component Value Date   ALT 180* 12/25/2010   AST 68* 12/25/2010   ALKPHOS 237* 12/25/2010   BILITOT 1.4* 12/25/2010   Discharge Diagnoses: Preterm delivery at 31.[redacted] weeks GA, cholestasis of pregnancy  Brief Hospital Course: 25 y.o. G1 who was admitted 10/15 with preterm labor and pruritis.  Patient had no prenatal care and language barrier ([place]).  She was admitted to antenatal unit and started on betamethasone (2nd dose 5 am on 10/17), antibiotics for GBS prophylaxis, and magnesium sulfate for tocolysis and fetal neuroprotection.  She had no signs or symptoms of toxicity and always had reassuring fetal heart rate tracing. While still on tocolysis, she had preterm premature rupture of membranes (PPROM) on 12/22/10 at 1715 with meconium stained fluid.  She was started on latency antibiotics and had a neonatology consultation.  Overnight, she was noted to have progression of preterm labor into active labor.   On 12/23/10, tocolysis was discontinued, and she was transferred to L&D where she had an uncomplicated SVD of female infant.  Infant was taken to the NICU, and the patient was stable on the postpartum unit.    Of note, patient was noted to have cholestasis of pregnancy with increased LFTs in the 200s, and bile acid level of 72. Hepatitis panel was negative.  Her pruritus was treated with diphenhydramine and she progressed to SVD so no other agents were initiated.  She  continued to have pruritus postpartum but this was ameliorated on the diphenhydramine.  Her LFTs were noted to be trending down in the postpartum period and she will go home on diphenhydramine as needed.  No further monitoring of LFTs is indicated.  Patient had an uncomplicated postpartum course.  By time of discharge on PPD#2, her pain was controlled on oral pain medications; she had appropriate lochia and was ambulating, voiding without difficulty, tolerating regular diet and passing flatus.  She is breastfeeding and will use condoms for postpartum contraception.  She was deemed stable for discharge to home.    Discharge Exam: Blood pressure 92/59, pulse 50, temperature 97.5 F (36.4 C), temperature source Oral, resp. rate 16, height 5' (1.524 m), weight 54.568 kg (120 lb 4.8 oz), last menstrual period 05/19/2010, SpO2 97.00%, unknown if currently breastfeeding. General appearance: alert and no distress Resp: clear to auscultation bilaterally Cardio: regular rate and rhythm Abdomen: Fundus below umbilicus, appropriagte lochia. NT abdomen. Extremities: extremities normal, atraumatic, no cyanosis or edema and Homans sign is negative, no sign of DVT Pulses: 2+ and symmetric Neurologic: Alert and oriented X 3, normal strength and tone. Normal symmetric reflexes. Normal coordination and gait  Discharge Information: Date: 12/25/2010 Activity: unrestricted, pelvic rest and as per booklet Diet: routine Medications: Ibuprofen and Benadryl and PNV Baby feeding: plans to breastfeed Contraception: condoms Condition: stable and improved Instructions: refer to practice specific booklet Discharge to: home  Newborn Data: Live born female  Birth Weight: 3 lb 4.6 oz (1491 g) APGAR: 8, 9 Infant in NICU, was born at 31.[redacted] weeks GA.  Stable at time of mother's discharge.  Jolane Bankhead A 12/25/2010,10:19 AM

## 2010-12-25 NOTE — Discharge Summary (Signed)
Patient stable upon discharge. Discharge instructions and prescriptions given to patient and her husband via [place] interpreters. Patient ambulated out of hospital with husband and RN.

## 2011-01-31 ENCOUNTER — Encounter: Payer: Self-pay | Admitting: Advanced Practice Midwife

## 2011-01-31 ENCOUNTER — Ambulatory Visit (INDEPENDENT_AMBULATORY_CARE_PROVIDER_SITE_OTHER): Payer: Self-pay | Admitting: Advanced Practice Midwife

## 2011-01-31 DIAGNOSIS — Z3049 Encounter for surveillance of other contraceptives: Secondary | ICD-10-CM

## 2011-01-31 LAB — POCT PREGNANCY, URINE: Preg Test, Ur: NEGATIVE

## 2011-01-31 NOTE — Progress Notes (Signed)
  Subjective:     Dawn Haney is a 25 y.o. female who presents for a postpartum visit. She is 6 week postpartum following a spontaneous vaginal delivery. I have fully reviewed the prenatal and intrapartum course. The delivery was at 31.1 gestational weeks. Outcome: spontaneous vaginal delivery. She was also Dx w/ cholestasis of pregnancy. Labs normal by time of D/C. Postpartum course has been normal. Baby's course has been complicated prematurity. Baby will be D/C'd home today. Baby is feeding by bottle-feeding breastmilk. Bleeding no bleeding. Bowel function is normal. Bladder function is normal. Patient is sexually active. Contraception method is condoms, but she is considering Depo-Provera. Postpartum depression screening: negative. Has resumed unprotected IC. No menses.  The following portions of the patient's history were reviewed and updated as appropriate: allergies, current medications, past family history, past medical history, past social history, past surgical history and problem list.  Review of Systems A comprehensive review of systems was negative. Pruritis resolved.  Objective:    BP 88/66  Pulse 81  Temp(Src) 98.2 F (36.8 C) (Oral)  Ht 4' 11.5" (1.511 m)  Wt 109 lb 1.6 oz (49.487 kg)  BMI 21.67 kg/m2  General:  alert, cooperative and no distress   Breasts:  negative  Lungs: clear to auscultation bilaterally  Heart:  regular rate and rhythm, S1, S2 normal, no murmur, click, rub or gallop  Abdomen: soft, non-tender; bowel sounds normal; no masses,  no organomegaly   Vulva:  normal  Vagina: normal vagina, no discharge, exudate, lesion, or erythema  Cervix:  deferred  Corpus: normal size, contour, position, consistency, mobility, non-tender  Adnexa:  no mass, fullness, tenderness  Rectal Exam: Not performed.       UPT neg Assessment:    6 week postpartum exam.    Plan:    1. Contraception: Plans Depo-Provera injections 2. Follow up in: 2 weeks for IUP and  Depo.  Dorathy Kinsman 01/31/2011 5:23 PM

## 2011-01-31 NOTE — Patient Instructions (Signed)
  Contraception Choices Birth control (contraception) can stop pregnancy from happening. Different types of birth control work in different ways. Some can:  Make the mucus in the cervix thick. This makes it hard for sperm to get into the uterus.   Thin the lining of the uterus. This makes it hard for an egg to attach to the wall of the uterus.   Stop the ovaries from releasing an egg.   Block the sperm from reaching the egg.  Certain types of surgery can stop pregnancy from happening. For women, the sugery closes the fallopian tubes (tubal ligation). For men, the surgery stops sperm from releasing during sex (vasectomy). HORMONAL BIRTH CONTROL Hormonal birth control stops pregnancy by putting hormones into your body. Types of birth control include:  A small tube put under the skin of the upper arm (implant). The tube can stay in place for 3 years.   Shots given every 3 months.   Pills taken every day or once after sex (intercourse).   Patches that are changed once a week.   A ring put into the vagina (vaginal ring). The ring is left in place for 3 weeks and removed for 1 week. Then, a new ring is put in the vagina.  BARRIER BIRTH CONTROL  Barrier birth control blocks sperm from reaching the egg. Types of birth control include:   A thin covering worn on the penis (female condom) during sex.   A soft, loose covering put into the vagina (female condom) before sex.   A rubber bowl that sits over the cervix (diaphragm). The bowl must be made for you. The bowl is put into the vagina before sex. The bowl is left in place for 6 to 8 hours after sex.   A small, soft cup that fits over the cervix (cervical cap). The cup must be made for you. The cup can be left in place for 48 hours after sex.   A sponge that is put into the vagina before sex.   A chemical that kills or blocks sperm from getting into the cervix and uterus (spermicide). The chemical may be a cream, jelly, foam, or pill.    INTRAUTERINE (IUD) BIRTH CONTROL  IUD birth control is a small, T-shaped piece of plastic. The plastic is put inside the uterus. There are 2 types of IUD:  Copper IUD. The IUD is covered in copper wire. The copper makes a fluid that kills sperm. It can stay in place for 10 years.   Hormone IUD. The hormone stops pregnancy from happening. It can stay in place for 5 years.  NATURAL FAMILY PLANNING BIRTH CONTROL  Natural family planning means not having sex or using barrier birth control when the woman is fertile. A woman can:  Use a calendar to keep track of when she is fertile.   Use a thermometer to measure her body temperature.  Protect yourself against sexual diseases no matter what type of birth control you use. Talk to your doctor about which type of birth control is best for you. Document Released: 12/19/2008 Document Revised: 11/03/2010 Document Reviewed: 06/30/2010 [facility] Patient Information 2012 [place], [place].

## 2014-01-06 ENCOUNTER — Encounter: Payer: Self-pay | Admitting: Advanced Practice Midwife

## 2018-05-07 ENCOUNTER — Ambulatory Visit (INDEPENDENT_AMBULATORY_CARE_PROVIDER_SITE_OTHER): Payer: Self-pay | Admitting: Nurse Practitioner

## 2018-05-07 ENCOUNTER — Ambulatory Visit
Admission: EM | Admit: 2018-05-07 | Discharge: 2018-05-07 | Disposition: A | Discharge status: Home / Self Care | Payer: Self-pay | Attending: Emergency Medicine | Admitting: Emergency Medicine

## 2018-05-07 ENCOUNTER — Ambulatory Visit (INDEPENDENT_AMBULATORY_CARE_PROVIDER_SITE_OTHER): Payer: Self-pay

## 2018-05-07 VITALS — BP 95/70 | HR 80 | Temp 98.4°F | Resp 16 | Wt 116.0 lb

## 2018-05-07 DIAGNOSIS — R0982 Postnasal drip: Secondary | ICD-10-CM

## 2018-05-07 DIAGNOSIS — J45901 Unspecified asthma with (acute) exacerbation: Secondary | ICD-10-CM

## 2018-05-07 DIAGNOSIS — R05 Cough: Secondary | ICD-10-CM

## 2018-05-07 DIAGNOSIS — J309 Allergic rhinitis, unspecified: Secondary | ICD-10-CM

## 2018-05-07 MED ORDER — ALBUTEROL SULFATE HFA 108 (90 BASE) MCG/ACT IN AERS: 1.0000 | INHALATION_SPRAY | Freq: Four times a day (QID) | RESPIRATORY_TRACT | 0 refills | Status: DC | PRN

## 2018-05-07 MED ORDER — AEROCHAMBER PLUS MISC: 2 refills | Status: DC

## 2018-05-07 MED ORDER — PREDNISONE 50 MG PO TABS: 50.0000 mg | ORAL_TABLET | Freq: Every day | ORAL | 0 refills | Status: AC

## 2018-05-07 MED ORDER — FLUTICASONE PROPIONATE 50 MCG/ACT NA SUSP: 2.0000 | Freq: Every day | NASAL | 0 refills | Status: DC

## 2018-05-07 MED ORDER — HYDROCOD POLST-CPM POLST ER 10-8 MG/5ML PO SUER: 5.0000 mL | Freq: Two times a day (BID) | ORAL | 0 refills | Status: DC | PRN

## 2018-05-07 NOTE — Discharge Instructions (Addendum)
2 puffs from your albuterol inhaler every 4 hours for the next 2 days, then every 6 hours for 2 days after that.  You may use it as needed.  Start some saline nasal irrigation with a Lloyd Huger med rinse and distilled water as often as you want.  Flonase for the nasal congestion.  Start some Claritin Zyrtec or Allegra to help with the nasal congestion.  Finish the prednisone, even if you feel better.  Tussionex for the cough at night.  Follow-up with your primary care physician of your choice, see list below.  Below is a list of primary care practices who are taking new patients for you to follow-up with.  [facility] at [facility] [address] [place], [place] [address] [phone]  [facility] [address] [place], [place] [address] [phone]  [facility] [phone] [address] [place] [address]  [facility]: [address] [place] [address]  [phone]  [facility]: [address] [place] [address]   [phone]  [facility]: [address] [place] [address]  [phone]   primary care : [address] [place] [address] [phone]  [facility]: [address] [place] [address] [phone]  [facility]: [address] [place] [address] [phone]  Dr. Oneal Grout [address] [place] [facility] [place] [address]  [phone]  Dr. Jackie Plum, [facility]. [address] [place], [place] [address]  [phone]  Go to www.goodrx.com to look up your medications. This will give you a list of where you can find your prescriptions at the most affordable prices. Or ask the  pharmacist what the cash price is, or if they have any other discount programs available to help make your medication more affordable. This can be less expensive than what you would pay with insurance.

## 2018-05-07 NOTE — Progress Notes (Signed)
Interpreter: Fleet Contras; interpreter ID: [id number]   The patient presents to our office for cough, and chest pain with cough.  Patient informs that she has been coughing for over 2 weeks now, and has chest pain with the cough.  Patient informs that she has a history of asthma, but has never been seen for her asthma [place]  Patient informs that she has just been buying the medication from [place] of [place].  Patient also informs she has been using her inhaler at least 4-6 times a day over the last 2 weeks for her cough.  I feel this patient may need a chest x-ray as she has asthma that is never been treated or controlled.  We do not have the resources in our office to perform imaging, will refer the patient to another urgent care.

## 2018-05-07 NOTE — ED Provider Notes (Signed)
HPI  SUBJECTIVE:  Dawn Haney is a 33 y.o. female who presents with 2 weeks of a nonproductive cough, chest congestion, nasal congestion, rhinorrhea, postnasal drip, "itchy throat".  She reports chest soreness secondary to the cough, wheezing, cough worse at night.  States that she is unable to sleep at night secondary to the cough.  She denies fevers, sinus pain or pressure, dyspnea on exertion.  She reports belching, but denies water brash or burning chest pain.  No calf pain, swelling, surgery in the past 4 weeks, recent prolonged immobilization, hemoptysis, exogenous estrogen.  She has been using DayQuil, NyQuil, and a rescue albuterol inhaler 5-6 times a day. Does not have a spacer for this.  States she normally uses the albuterol as needed.  No aggravating or alleviating factors.  She has a past medical history of asthma, GERD.  She is not taking any medications for this.  No history of hypertension, diabetes, smoking, vaping, DVT, PE.  LMP: 3 weeks ago.  PMD: None.   Past Medical History:  Diagnosis Date  . No pertinent past medical history     Past Surgical History:  Procedure Laterality Date  . NO PAST SURGERIES      No family history on file.  Social History   Tobacco Use  . Smoking status: Never Smoker  . Smokeless tobacco: Never Used  Substance Use Topics  . Alcohol use: No  . Drug use: No    No current facility-administered medications for this encounter.   Current Outpatient Medications:  .  albuterol (PROVENTIL HFA;VENTOLIN HFA) 108 (90 Base) MCG/ACT inhaler, Inhale 1-2 puffs into the lungs every 6 (six) hours as needed for wheezing or shortness of breath., Disp: 1 Inhaler, Rfl: 0 .  chlorpheniramine-HYDROcodone (TUSSIONEX PENNKINETIC ER) 10-8 MG/5ML SUER, Take 5 mLs by mouth every 12 (twelve) hours as needed for cough., Disp: 60 mL, Rfl: 0 .  fluticasone (FLONASE) 50 MCG/ACT nasal spray, Place 2 sprays into both nostrils daily., Disp: 16 g, Rfl: 0 .   predniSONE (DELTASONE) 50 MG tablet, Take 1 tablet (50 mg total) by mouth daily with breakfast for 5 days., Disp: 5 tablet, Rfl: 0 .  Spacer/Aero-Holding Chambers (AEROCHAMBER PLUS) inhaler, Use as instructed, Disp: 1 each, Rfl: 2  Allergies  Allergen Reactions  . Shrimp [Shellfish Allergy] Itching  . Tylenol [Acetaminophen] Rash     ROS  As noted in HPI.   Physical Exam  BP 105/72 (BP Location: Left Arm)   Pulse 83   Temp 97.9 F (36.6 C) (Oral)   Resp 18   LMP 04/16/2018   SpO2 96%  Constitutional: Well developed, well nourished, no acute distress Eyes:  EOMI, conjunctiva normal bilaterally HENT: Normocephalic, atraumatic,mucus membranes moist.  Pale, swollen turbinates with clear nasal congestion.  Positive cobblestoning with extensive postnasal drip. Respiratory: Normal inspiratory effort, good air movement.  Faint scattered expiratory wheezing.  No rales, rhonchi.  Positive anterior chest wall tenderness. Cardiovascular: Normal rate regular rhythm, no murmurs rubs or gallops. GI: nondistended skin: No rash, skin intact Musculoskeletal: no deformities.  Calves symmetric, nontender, no edema. Neurologic: Alert & oriented x 3, no focal neuro deficits Psychiatric: Speech and behavior appropriate   ED Course   Medications - No data to display  Orders Placed This Encounter  Procedures  . DG Chest 2 View    Standing Status:   Standing    Number of Occurrences:   1    Order Specific Question:   Reason for Exam (SYMPTOM  OR  DIAGNOSIS REQUIRED)    Answer:   SOB, chest pain radiating to back    Order Specific Question:   Is patient pregnant?    Answer:   No    No results found for this or any previous visit (from the past 24 hour(s)). Dg Chest 2 View  Result Date: 05/07/2018 CLINICAL DATA:  Cough and congestion.  Shortness of breath. EXAM: CHEST - 2 VIEW COMPARISON:  None. FINDINGS: The heart, hila, and mediastinum are unremarkable. Probable calcified node in the left  hilum. The lungs are clear. No infiltrate. No other acute abnormalities. IMPRESSION: No active cardiopulmonary disease. Electronically Signed   By: Gerome Sam III M.D   On: 05/07/2018 12:56    ED Clinical Impression  Mild asthma with acute exacerbation, unspecified whether persistent  Allergic rhinitis with postnasal drip   ED Assessment/Plan  [place] Narcotic database reviewed for this patient, and feel that the risk/benefit ratio today is favorable for proceeding with a prescription for controlled substance.  Opiate prescriptions in the past 2 years.  Reviewed imaging independently.  Probable calcified node in left hilum.  No pneumonia.  See radiology report for full details.  Presentation consistent with an asthma exacerbation versus a cough from postnasal drip/allergies.  Patient is PERC negative.  Will send home with regular albuterol with a spacer for the next 4 days may use the albuterol as needed, saline nasal irrigation, Flonase, and antihistamine of her choice such as Claritin, Zyrtec or Allegra, and prednisone 50 mg.  Tussionex for the cough at night.  Providing primary care referral list for ongoing care.  Discussed imaging, MDM, treatment plan, and plan for follow-up with patient. Discussed sn/sx that should prompt return to the ED. patient agrees with plan.   Meds ordered this encounter  Medications  . albuterol (PROVENTIL HFA;VENTOLIN HFA) 108 (90 Base) MCG/ACT inhaler    Sig: Inhale 1-2 puffs into the lungs every 6 (six) hours as needed for wheezing or shortness of breath.    Dispense:  1 Inhaler    Refill:  0  . fluticasone (FLONASE) 50 MCG/ACT nasal spray    Sig: Place 2 sprays into both nostrils daily.    Dispense:  16 g    Refill:  0  . predniSONE (DELTASONE) 50 MG tablet    Sig: Take 1 tablet (50 mg total) by mouth daily with breakfast for 5 days.    Dispense:  5 tablet    Refill:  0  . Spacer/Aero-Holding Chambers (AEROCHAMBER PLUS) inhaler    Sig: Use as  instructed    Dispense:  1 each    Refill:  2  . chlorpheniramine-HYDROcodone (TUSSIONEX PENNKINETIC ER) 10-8 MG/5ML SUER    Sig: Take 5 mLs by mouth every 12 (twelve) hours as needed for cough.    Dispense:  60 mL    Refill:  0    *This clinic note was created using Scientist, clinical (histocompatibility and immunogenetics). Therefore, there may be occasional mistakes despite careful proofreading.   ?    Domenick Gong, MD 05/07/18 1421

## 2018-05-07 NOTE — ED Triage Notes (Signed)
Pt c/o cough, chest congestion and SOB for over a week.  ?

## 2018-06-13 ENCOUNTER — Other Ambulatory Visit: Payer: Self-pay

## 2018-06-13 ENCOUNTER — Ambulatory Visit
Admission: EM | Admit: 2018-06-13 | Discharge: 2018-06-13 | Disposition: A | Discharge status: Home / Self Care | Payer: Self-pay | Attending: Emergency Medicine | Admitting: Emergency Medicine

## 2018-06-13 DIAGNOSIS — J4521 Mild intermittent asthma with (acute) exacerbation: Secondary | ICD-10-CM

## 2018-06-13 HISTORY — DX: Unspecified asthma, uncomplicated: J45.909

## 2018-06-13 MED ORDER — CETIRIZINE HCL 10 MG PO TABS: 10.0000 mg | ORAL_TABLET | Freq: Every day | ORAL | 0 refills | Status: DC

## 2018-06-13 MED ORDER — IPRATROPIUM-ALBUTEROL 0.5-2.5 (3) MG/3ML IN SOLN: 3.0000 mL | RESPIRATORY_TRACT | 0 refills | Status: DC | PRN

## 2018-06-13 NOTE — Discharge Instructions (Signed)
Continue with daily nasal spray as prescribed.  Daily zyrtec.  Use of inhaler as needed.  Nebulizer every 4-6 hours as needed for wheezing.  Please follow up if any worsening or persistent symptoms.  Please establish with a primary care provider for management as needed.

## 2018-06-13 NOTE — ED Provider Notes (Signed)
[facility]    CSN: [id number] Arrival date & time: 06/13/18  1244     History   Chief Complaint Chief Complaint  Patient presents with  . Medication Refill    HPI Dawn Haney is a 33 y.o. female.   Dawn Haney presents with requests for refill of Tussionex cough syrup. Was seen here 3/2 with complaints of two week history of cough, worse at night, related to URI vs asthma. She does have an asthma history. Was given 5 days of prednisone, inhaler, flonase, and cough syrup, with recommendation to also start antihistamine. Returns to clinic today as symptoms returned two days ago. Cough is occasionally productive. Shortness of breath  At times. No nasal drainage sore throat or ear pain. No gi/gu complaints. No fevers. No recent travel. She feels wheezy at times. She doesn't have a PCP. She has a nebulizer machine at home which is her sons which she has used occasionally and helps. She has used her inhaler approximately 4-5 times already today. She doesn't smoke.    Clydie BraunKaren video interpreter used to collect history and physical exam.    ROS per HPI, negative if not otherwise mentioned.      Past Medical History:  Diagnosis Date  . Asthma   . No pertinent past medical history     Patient Active Problem List   Diagnosis Date Noted  . Preterm delivery, delivered 01/31/2011    Past Surgical History:  Procedure Laterality Date  . NO PAST SURGERIES      OB History    Gravida  1   Para  1   Term  0   Preterm  1   AB  0   Living  1     SAB  0   TAB  0   Ectopic  0   Multiple  0   Live Births  1            Home Medications    Prior to Admission medications   Medication Sig Start Date End Date Taking? Authorizing Provider  albuterol (PROVENTIL HFA;VENTOLIN HFA) 108 (90 Base) MCG/ACT inhaler Inhale 1-2 puffs into the lungs every 6 (six) hours as needed for wheezing or shortness of breath. 05/07/18   Domenick GongMortenson, Ashley, MD   cetirizine (ZYRTEC) 10 MG tablet Take 1 tablet (10 mg total) by mouth daily. 06/13/18   Georgetta HaberBurky, Mia Milan B, NP  chlorpheniramine-HYDROcodone (TUSSIONEX PENNKINETIC ER) 10-8 MG/5ML SUER Take 5 mLs by mouth every 12 (twelve) hours as needed for cough. 05/07/18   Domenick GongMortenson, Ashley, MD  fluticasone (FLONASE) 50 MCG/ACT nasal spray Place 2 sprays into both nostrils daily. 05/07/18   Domenick GongMortenson, Ashley, MD  ipratropium-albuterol (DUONEB) 0.5-2.5 (3) MG/3ML SOLN Take 3 mLs by nebulization every 4 (four) hours as needed. 06/13/18   Georgetta HaberBurky, Tyson Parkison B, NP  Spacer/Aero-Holding Chambers (AEROCHAMBER PLUS) inhaler Use as instructed 05/07/18   Domenick GongMortenson, Ashley, MD    Family History History reviewed. No pertinent family history.  Social History Social History   Tobacco Use  . Smoking status: Never Smoker  . Smokeless tobacco: Never Used  Substance Use Topics  . Alcohol use: No  . Drug use: No     Allergies   Shrimp [shellfish allergy] and Tylenol [acetaminophen]   Review of Systems Review of Systems   Physical Exam Triage Vital Signs ED Triage Vitals  Enc Vitals Group     BP      Pulse      Resp  Temp      Temp src      SpO2      Weight      Height      Head Circumference      Peak Flow      Pain Score      Pain Loc      Pain Edu?      Excl. in GC?    No data found.  Updated Vital Signs BP 118/78 (BP Location: Left Arm)   Pulse 79   Temp 98.3 F (36.8 C) (Oral)   Resp 18   LMP 06/12/2018   SpO2 97%   Visual Acuity Right Eye Distance:   Left Eye Distance:   Bilateral Distance:    Right Eye Near:   Left Eye Near:    Bilateral Near:     Physical Exam Constitutional:      General: She is not in acute distress.    Appearance: She is well-developed.  HENT:     Head: Normocephalic and atraumatic.     Right Ear: Tympanic membrane, ear canal and external ear normal.     Left Ear: Tympanic membrane, ear canal and external ear normal.     Nose: Nose normal.      Mouth/Throat:     Pharynx: Uvula midline.     Tonsils: No tonsillar exudate.  Eyes:     Conjunctiva/sclera: Conjunctivae normal.     Pupils: Pupils are equal, round, and reactive to light.  Cardiovascular:     Rate and Rhythm: Normal rate and regular rhythm.     Heart sounds: Normal heart sounds.  Pulmonary:     Effort: Pulmonary effort is normal.     Breath sounds: Normal breath sounds. No wheezing.     Comments: No current wheezing on auscultation. Occasional strong dry cough noted only once this provider was out of room. Speaking easily, no increased work of breathing.  Skin:    General: Skin is warm and dry.  Neurological:     Mental Status: She is alert and oriented to person, place, and time.      UC Treatments / Results  Labs (all labs ordered are listed, but only abnormal results are displayed) Labs Reviewed - No data to display  EKG None  Radiology No results found.  Procedures Procedures (including critical care time)  Medications Ordered in UC Medications - No data to display  Initial Impression / Assessment and Plan / UC Course  I have reviewed the triage vital signs and the nursing notes.  Pertinent labs & imaging results that were available during my care of the patient were reviewed by me and considered in my medical decision making (see chart for details).     Asthmatic, nebulizers and inhaler have helped. Normal chest xray at last visit and symptoms had improved. Non toxic in appearance. Afebrile. Did not start any thing for allergies, which is very likely this time of year to be triggering her asthma. Continue with flonase, to start zyrtec. duonoeb script provided. Encouraged establish with a PCP as able although this is difficult due to current pandemic. Return precautions provided. Patient verbalized understanding and agreeable to plan.    Final Clinical Impressions(s) / UC Diagnoses   Final diagnoses:  Mild intermittent asthma with acute  exacerbation     Discharge Instructions     Continue with daily nasal spray as prescribed.  Daily zyrtec.  Use of inhaler as needed.  Nebulizer every 4-6 hours as needed for  wheezing.  Please follow up if any worsening or persistent symptoms.  Please establish with a primary care provider for management as needed.     ED Prescriptions    Medication Sig Dispense Auth. Provider   ipratropium-albuterol (DUONEB) 0.5-2.5 (3) MG/3ML SOLN Take 3 mLs by nebulization every 4 (four) hours as needed. 360 mL Linus Mako B, NP   cetirizine (ZYRTEC) 10 MG tablet Take 1 tablet (10 mg total) by mouth daily. 30 tablet Georgetta Haber, NP     Controlled Substance Prescriptions [place] Controlled Substance Registry consulted? Not Applicable   Georgetta Haber, NP 06/13/18 1322

## 2018-06-13 NOTE — ED Triage Notes (Signed)
Pt requesting a refill for cough syrup that she received last month. States having a cough only at night and "its hard to breath". Denies any other sx's

## 2018-06-19 ENCOUNTER — Other Ambulatory Visit: Payer: Self-pay

## 2018-06-19 ENCOUNTER — Emergency Department (HOSPITAL_COMMUNITY): Payer: Self-pay

## 2018-06-19 ENCOUNTER — Emergency Department (HOSPITAL_COMMUNITY)
Admission: EM | Admit: 2018-06-19 | Discharge: 2018-06-19 | Disposition: A | Discharge status: Home / Self Care | Payer: Self-pay | Attending: Emergency Medicine | Admitting: Emergency Medicine

## 2018-06-19 ENCOUNTER — Encounter (HOSPITAL_COMMUNITY): Payer: Self-pay | Admitting: Emergency Medicine

## 2018-06-19 DIAGNOSIS — R059 Cough, unspecified: Secondary | ICD-10-CM

## 2018-06-19 DIAGNOSIS — J45909 Unspecified asthma, uncomplicated: Secondary | ICD-10-CM | POA: Insufficient documentation

## 2018-06-19 DIAGNOSIS — Z79899 Other long term (current) drug therapy: Secondary | ICD-10-CM | POA: Insufficient documentation

## 2018-06-19 DIAGNOSIS — R05 Cough: Secondary | ICD-10-CM | POA: Insufficient documentation

## 2018-06-19 MED ORDER — PREDNISONE 20 MG PO TABS: 40.0000 mg | ORAL_TABLET | Freq: Every day | ORAL | 0 refills | Status: DC

## 2018-06-19 MED ORDER — HYDROCOD POLST-CPM POLST ER 10-8 MG/5ML PO SUER: 5.0000 mL | Freq: Two times a day (BID) | ORAL | 0 refills | Status: DC | PRN

## 2018-06-19 MED ORDER — PREDNISONE 20 MG PO TABS
60.0000 mg | ORAL_TABLET | ORAL | Status: AC
  Administered 2018-06-19: 13:00:00 60 mg via ORAL
  Filled 2018-06-19: qty 3

## 2018-06-19 NOTE — ED Notes (Signed)
Patient transported to X-ray 

## 2018-06-19 NOTE — ED Notes (Signed)
Pt. Back from xray

## 2018-06-19 NOTE — ED Triage Notes (Signed)
PT reports on going cough for one month. Pt reports dry cough . Pt has been seen x2 for same. Pt uses a rescue inhaler and Neb machine. Pt reports she can not sleep.

## 2018-06-19 NOTE — ED Triage Notes (Signed)
Pt endorses worsening cough x 2 days, despite medications and cough syrup. Denies any fevers or cp

## 2018-06-19 NOTE — Discharge Instructions (Addendum)
Please continue taking all your medication as directed, and follow-up with your physician. Return here for concerning changes in your condition.

## 2018-06-19 NOTE — ED Provider Notes (Signed)
[facility] EMERGENCY DEPARTMENT Provider Note   CSN: [id number] Arrival date & time: 06/19/18  1203    History   Chief Complaint Chief Complaint  Patient presents with  . Cough    HPI Angelika Dermer is a 33 y.o. female.     HPI Patient presents with concern of ongoing cough, occasional chest discomfort, largely with coughing. Patient has a history of asthma, states that she is otherwise well. She is recently had some relief with albuterol, Zyrtec. In addition, she had recent prescription for Tussionex, which seemed to improve her cough as well. She denies fever, vomiting, other pain. Patient speaks [place], but preferred language is Clydie Braun.  Attempts to use video translator, audio translator unsuccessful, as was nonfunctional.  Patient was amenable to [place], with slow speech, and multiple questions to confirm understanding. Past Medical History:  Diagnosis Date  . Asthma   . No pertinent past medical history     Patient Active Problem List   Diagnosis Date Noted  . Preterm delivery, delivered 01/31/2011    Past Surgical History:  Procedure Laterality Date  . NO PAST SURGERIES       OB History    Gravida  1   Para  1   Term  0   Preterm  1   AB  0   Living  1     SAB  0   TAB  0   Ectopic  0   Multiple  0   Live Births  1            Home Medications    Prior to Admission medications   Medication Sig Start Date End Date Taking? Authorizing Provider  albuterol (PROVENTIL HFA;VENTOLIN HFA) 108 (90 Base) MCG/ACT inhaler Inhale 1-2 puffs into the lungs every 6 (six) hours as needed for wheezing or shortness of breath. 05/07/18  Yes Domenick Gong, MD  cetirizine (ZYRTEC) 10 MG tablet Take 1 tablet (10 mg total) by mouth daily. 06/13/18  Yes Burky, Barron Alvine, NP  fluticasone (FLONASE) 50 MCG/ACT nasal spray Place 2 sprays into both nostrils daily. 05/07/18  Yes Domenick Gong, MD  ipratropium-albuterol (DUONEB) 0.5-2.5  (3) MG/3ML SOLN Take 3 mLs by nebulization every 4 (four) hours as needed. 06/13/18  Yes Burky, Barron Alvine, NP  chlorpheniramine-HYDROcodone (TUSSIONEX PENNKINETIC ER) 10-8 MG/5ML SUER Take 5 mLs by mouth every 12 (twelve) hours as needed for cough. 06/19/18   Gerhard Munch, MD  predniSONE (DELTASONE) 20 MG tablet Take 2 tablets (40 mg total) by mouth daily with breakfast. For the next four days 06/19/18   Gerhard Munch, MD  Spacer/Aero-Holding Chambers (AEROCHAMBER PLUS) inhaler Use as instructed 05/07/18   Domenick Gong, MD    Family History No family history on file.  Social History Social History   Tobacco Use  . Smoking status: Never Smoker  . Smokeless tobacco: Never Used  Substance Use Topics  . Alcohol use: No  . Drug use: No     Allergies   Shrimp [shellfish allergy] and Tylenol [acetaminophen]   Review of Systems Review of Systems  Constitutional:       Per HPI, otherwise negative  HENT:       Per HPI, otherwise negative  Respiratory:       Per HPI, otherwise negative  Cardiovascular:       Per HPI, otherwise negative  Gastrointestinal: Negative for vomiting.  Endocrine:       Negative aside from HPI  Genitourinary:  Neg aside from HPI   Musculoskeletal:       Per HPI, otherwise negative  Skin: Negative.   Neurological: Negative for syncope.     Physical Exam Updated Vital Signs BP 105/81 (BP Location: Right Arm)   Pulse 90   Temp 98.1 F (36.7 C) (Oral)   Resp 15   Ht 5' (1.524 m)   Wt 46.7 kg   LMP 06/12/2018   SpO2 98%   BMI 20.12 kg/m   Physical Exam Vitals signs and nursing note reviewed.  Constitutional:      General: She is not in acute distress.    Appearance: She is well-developed.  HENT:     Head: Normocephalic and atraumatic.  Eyes:     Conjunctiva/sclera: Conjunctivae normal.  Cardiovascular:     Rate and Rhythm: Normal rate and regular rhythm.  Pulmonary:     Effort: Pulmonary effort is normal. No respiratory  distress.     Breath sounds: No stridor. No wheezing.     Comments: Decreased breath sounds bilaterally, no obvious wheezing Abdominal:     General: There is no distension.  Skin:    General: Skin is warm and dry.  Neurological:     Mental Status: She is alert and oriented to person, place, and time.     Cranial Nerves: No cranial nerve deficit.      ED Treatments / Results  Labs (all labs ordered are listed, but only abnormal results are displayed) Labs Reviewed - No data to display  EKG EKG Interpretation  Date/Time:  Tuesday June 19 2018 13:38:45 EDT Ventricular Rate:  94 PR Interval:  118 QRS Duration: 76 QT Interval:  324 QTC Calculation: 405 R Axis:   88 Text Interpretation:  Normal sinus rhythm Nonspecific T wave abnormality Abnormal ECG Abnormal ekg Confirmed by Gerhard Munch [phone]) on 06/19/2018 1:51:36 PM   Radiology Dg Chest 2 View  Result Date: 06/19/2018 CLINICAL DATA:  Cough EXAM: CHEST - 2 VIEW COMPARISON:  None. FINDINGS: Lungs are clear. Heart size and pulmonary vascularity are normal. No adenopathy. No bone lesions. IMPRESSION: No edema or consolidation. Electronically Signed   By: Bretta Bang III M.D.   On: 06/19/2018 13:38    Procedures Procedures (including critical care time)  Medications Ordered in ED Medications  predniSONE (DELTASONE) tablet 60 mg (60 mg Oral Given 06/19/18 1310)     Initial Impression / Assessment and Plan / ED Course  I have reviewed the triage vital signs and the nursing notes.  Pertinent labs & imaging results that were available during my care of the patient were reviewed by me and considered in my medical decision making (see chart for details).  2:55 PM On repeat exam the patient was awake, alert, in no distress, no hemodynamic instability. I reviewed the x-ray, EKG with her.  We discussed her medication regimen, and she will start a short course of steroids to complement her bronchodilators.  Low suspicion  for atypical ACS given the absence of risk factors, reassuring x-ray, EKG, story.  No evidence for pneumonia, no evidence for atypical coronavirus infection.  Final Clinical Impressions(s) / ED Diagnoses   Final diagnoses:  Cough    ED Discharge Orders         Ordered    chlorpheniramine-HYDROcodone (TUSSIONEX PENNKINETIC ER) 10-8 MG/5ML SUER  Every 12 hours PRN     06/19/18 1439    predniSONE (DELTASONE) 20 MG tablet  Daily with breakfast     06/19/18 1439  Gerhard MunchLockwood, Ailea Rhatigan, MD 06/19/18 [phone]

## 2018-12-13 ENCOUNTER — Emergency Department (HOSPITAL_COMMUNITY)
Admission: EM | Admit: 2018-12-13 | Discharge: 2018-12-14 | Discharge status: Against Medical Advice | Payer: Medicaid Other | Attending: Emergency Medicine | Admitting: Emergency Medicine

## 2018-12-13 ENCOUNTER — Encounter (HOSPITAL_COMMUNITY): Payer: Self-pay | Admitting: Emergency Medicine

## 2018-12-13 ENCOUNTER — Other Ambulatory Visit: Payer: Self-pay

## 2018-12-13 DIAGNOSIS — Z5321 Procedure and treatment not carried out due to patient leaving prior to being seen by health care provider: Secondary | ICD-10-CM | POA: Insufficient documentation

## 2018-12-13 DIAGNOSIS — R05 Cough: Secondary | ICD-10-CM | POA: Diagnosis present

## 2018-12-13 NOTE — ED Triage Notes (Signed)
Pt c/o cough and shortness of breath x 3 weeks. Also reports that she is 4 mos pregnant.

## 2018-12-14 ENCOUNTER — Other Ambulatory Visit: Payer: Self-pay

## 2018-12-14 ENCOUNTER — Ambulatory Visit
Admission: EM | Admit: 2018-12-14 | Discharge: 2018-12-14 | Disposition: A | Discharge status: Home / Self Care | Payer: Medicaid Other | Attending: Emergency Medicine | Admitting: Emergency Medicine

## 2018-12-14 ENCOUNTER — Encounter: Payer: Self-pay | Admitting: Emergency Medicine

## 2018-12-14 DIAGNOSIS — R059 Cough, unspecified: Secondary | ICD-10-CM

## 2018-12-14 DIAGNOSIS — R05 Cough: Secondary | ICD-10-CM | POA: Diagnosis not present

## 2018-12-14 DIAGNOSIS — Z1159 Encounter for screening for other viral diseases: Secondary | ICD-10-CM

## 2018-12-14 MED ORDER — AZITHROMYCIN 250 MG PO TABS: 250.0000 mg | ORAL_TABLET | Freq: Every day | ORAL | 0 refills | Status: DC

## 2018-12-14 MED ORDER — IPRATROPIUM-ALBUTEROL 0.5-2.5 (3) MG/3ML IN SOLN: 3.0000 mL | RESPIRATORY_TRACT | 0 refills | Status: DC | PRN

## 2018-12-14 MED ORDER — ALBUTEROL SULFATE HFA 108 (90 BASE) MCG/ACT IN AERS: 1.0000 | INHALATION_SPRAY | Freq: Four times a day (QID) | RESPIRATORY_TRACT | 0 refills | Status: DC | PRN

## 2018-12-14 NOTE — Discharge Instructions (Addendum)
Go to ER for worsening cough, chest pain, shortness of breath, fever.

## 2018-12-14 NOTE — ED Provider Notes (Signed)
[facility]    CSN: [id number] Arrival date & time: 12/14/18  1332      History   Chief Complaint Chief Complaint  Patient presents with  . Cough    HPI Dawn Haney is a 33 y.o. female 4 months gestation presenting for 3-week course of productive, non-hemoptic cough with pleuritic chest pain, increased SOB.  Translation provided via [employer] video.  Patient denies fever, known sick contacts, recent travel.  States that she is run out of her Ventolin inhaler, DuoNeb yesterday due to increased use.  Patient states that these helped alleviate her chest tightness.  Patient states she has had similar symptoms in the past, though cannot remember what "pills they gave me ".   Past Medical History:  Diagnosis Date  . Asthma   . No pertinent past medical history     Patient Active Problem List   Diagnosis Date Noted  . Preterm delivery, delivered 01/31/2011    Past Surgical History:  Procedure Laterality Date  . NO PAST SURGERIES      OB History    Gravida  2   Para  1   Term  0   Preterm  1   AB  0   Living  1     SAB  0   TAB  0   Ectopic  0   Multiple  0   Live Births  1            Home Medications    Prior to Admission medications   Medication Sig Start Date End Date Taking? Authorizing Provider  albuterol (VENTOLIN HFA) 108 (90 Base) MCG/ACT inhaler Inhale 1-2 puffs into the lungs every 6 (six) hours as needed for wheezing or shortness of breath. 12/14/18   Hall-Potvin, [place], PA-C  azithromycin (ZITHROMAX) 250 MG tablet Take 1 tablet (250 mg total) by mouth daily. Take first 2 tablets together, then 1 every day until finished. 12/14/18   Hall-Potvin, [place], PA-C  cetirizine (ZYRTEC) 10 MG tablet Take 1 tablet (10 mg total) by mouth daily. 06/13/18   Georgetta Haber, NP  fluticasone (FLONASE) 50 MCG/ACT nasal spray Place 2 sprays into both nostrils daily. 05/07/18   Domenick Gong, MD  ipratropium-albuterol (DUONEB) 0.5-2.5  (3) MG/3ML SOLN Take 3 mLs by nebulization every 4 (four) hours as needed. 12/14/18   Hall-Potvin, [place], PA-C  Spacer/Aero-Holding Chambers (AEROCHAMBER PLUS) inhaler Use as instructed 05/07/18   Domenick Gong, MD    Family History History reviewed. No pertinent family history.  Social History Social History   Tobacco Use  . Smoking status: Never Smoker  . Smokeless tobacco: Never Used  Substance Use Topics  . Alcohol use: No  . Drug use: No     Allergies   Shrimp [shellfish allergy] and Tylenol [acetaminophen]   Review of Systems Review of Systems  Constitutional: Negative for fatigue and fever.  HENT: Negative for ear pain, sinus pain, sore throat and voice change.   Eyes: Negative for pain, redness and visual disturbance.  Respiratory: Positive for cough, chest tightness and shortness of breath. Negative for apnea, choking and stridor.   Cardiovascular: Negative for palpitations and leg swelling.       Positive for chest pain that is worse when coughing  Gastrointestinal: Negative for abdominal pain, diarrhea and vomiting.  Musculoskeletal: Negative for arthralgias and myalgias.  Skin: Negative for rash and wound.  Neurological: Negative for syncope and headaches.     Physical Exam Triage Vital Signs ED Triage  Vitals  Enc Vitals Group     BP 12/14/18 1342 102/72     Pulse Rate 12/14/18 1342 98     Resp 12/14/18 1342 18     Temp 12/14/18 1342 98.6 F (37 C)     Temp Source 12/14/18 1342 Oral     SpO2 12/14/18 1342 97 %     Weight --      Height --      Head Circumference --      Peak Flow --      Pain Score 12/14/18 1358 0     Pain Loc --      Pain Edu? --      Excl. in GC? --    No data found.  Updated Vital Signs BP 102/72 (BP Location: Left Arm)   Pulse 98   Temp 98.6 F (37 C) (Oral)   Resp 18   LMP 06/12/2018   SpO2 97%   Visual Acuity Right Eye Distance:   Left Eye Distance:   Bilateral Distance:    Right Eye Near:   Left Eye  Near:    Bilateral Near:     Physical Exam Constitutional:      General: She is not in acute distress.    Appearance: She is normal weight. She is not ill-appearing.  HENT:     Head: Normocephalic and atraumatic.     Nose: Nose normal.     Mouth/Throat:     Mouth: Mucous membranes are moist.     Pharynx: Oropharynx is clear.  Eyes:     General: No scleral icterus.    Conjunctiva/sclera: Conjunctivae normal.     Pupils: Pupils are equal, round, and reactive to light.  Neck:     Musculoskeletal: Normal range of motion and neck supple. No muscular tenderness.  Cardiovascular:     Rate and Rhythm: Normal rate and regular rhythm.     Heart sounds: No murmur. No gallop.   Pulmonary:     Effort: Pulmonary effort is normal. No respiratory distress.     Breath sounds: Wheezing present. No rhonchi.  Chest:     Chest wall: No tenderness.  Musculoskeletal:     Right lower leg: No edema.     Left lower leg: No edema.     Comments: No calf tenderness, negative Homans sign  Lymphadenopathy:     Cervical: No cervical adenopathy.  Skin:    Capillary Refill: Capillary refill takes less than 2 seconds.     Coloration: Skin is not jaundiced or pale.  Neurological:     General: No focal deficit present.     Mental Status: She is alert and oriented to person, place, and time.      UC Treatments / Results  Labs (all labs ordered are listed, but only abnormal results are displayed) Labs Reviewed  NOVEL CORONAVIRUS, NAA    EKG   Radiology No results found.  Procedures Procedures (including critical care time)  Medications Ordered in UC Medications - No data to display  Initial Impression / Assessment and Plan / UC Course  I have reviewed the triage vital signs and the nursing notes.  Pertinent labs & imaging results that were available during my care of the patient were reviewed by me and considered in my medical decision making (see chart for details).     1.  Cough  Patient hemodynamically stable, no signs/symptoms of acute respiratory distress.  Will refill inhaler, DuoNeb as this is safe in pregnancy.  Will add azithromycin (previous EKG on file from April 2020 reviewed by me: No QTC prolongation) to regimen, encouraged compliance with allergy medications.  COVID test pending: Patient to quarantine until results are back.  Patient denies history of DVT/PE: Discussed patient being pregnant increases risk, though this is low in differential at this time given chronicity, wheezing on exam, hemodynamic/oxygen stability.  Return precautions discussed, patient verbalized understanding and is agreeable to plan. Final Clinical Impressions(s) / UC Diagnoses   Final diagnoses:  Cough     Discharge Instructions     Go to ER for worsening cough, chest pain, shortness of breath, fever.    ED Prescriptions    Medication Sig Dispense Auth. Provider   albuterol (VENTOLIN HFA) 108 (90 Base) MCG/ACT inhaler Inhale 1-2 puffs into the lungs every 6 (six) hours as needed for wheezing or shortness of breath. 18 g Hall-Potvin, [place], PA-C   ipratropium-albuterol (DUONEB) 0.5-2.5 (3) MG/3ML SOLN Take 3 mLs by nebulization every 4 (four) hours as needed. 360 mL Hall-Potvin, [place], PA-C   azithromycin (ZITHROMAX) 250 MG tablet Take 1 tablet (250 mg total) by mouth daily. Take first 2 tablets together, then 1 every day until finished. 6 tablet Hall-Potvin, [place], PA-C     PDMP not reviewed this encounter.   Hall-Potvin, [place], [place] 12/14/18 1453

## 2018-12-14 NOTE — ED Triage Notes (Signed)
Pt presents to [facility] for assessment of cough with associated chest pain and SOB x 3 weeks.   Denies known exposure.  Denies fevers.  71mos pregnant.

## 2018-12-14 NOTE — ED Notes (Signed)
Patient able to ambulate independently  

## 2018-12-14 NOTE — ED Notes (Signed)
Pt did not respond when called for vitals check 

## 2018-12-16 LAB — NOVEL CORONAVIRUS, NAA: SARS-CoV-2, NAA: NOT DETECTED

## 2018-12-20 ENCOUNTER — Other Ambulatory Visit: Payer: Self-pay

## 2018-12-29 ENCOUNTER — Emergency Department (HOSPITAL_COMMUNITY)
Admission: EM | Admit: 2018-12-29 | Discharge: 2018-12-30 | Disposition: A | Discharge status: Home / Self Care | Payer: Medicaid Other | Attending: Emergency Medicine | Admitting: Emergency Medicine

## 2018-12-29 ENCOUNTER — Encounter (HOSPITAL_COMMUNITY): Payer: Self-pay | Admitting: Emergency Medicine

## 2018-12-29 ENCOUNTER — Other Ambulatory Visit: Payer: Self-pay

## 2018-12-29 DIAGNOSIS — R05 Cough: Secondary | ICD-10-CM | POA: Diagnosis present

## 2018-12-29 DIAGNOSIS — J45901 Unspecified asthma with (acute) exacerbation: Secondary | ICD-10-CM | POA: Insufficient documentation

## 2018-12-29 DIAGNOSIS — Z20828 Contact with and (suspected) exposure to other viral communicable diseases: Secondary | ICD-10-CM | POA: Insufficient documentation

## 2018-12-29 LAB — CBC WITH DIFFERENTIAL/PLATELET
Abs Immature Granulocytes: 0.04 10*3/uL (ref 0.00–0.07)
Basophils Absolute: 0.1 10*3/uL (ref 0.0–0.1)
Basophils Relative: 1 %
Eosinophils Absolute: 0.7 10*3/uL — ABNORMAL HIGH (ref 0.0–0.5)
Eosinophils Relative: 6 %
HCT: 39.6 % (ref 36.0–46.0)
Hemoglobin: 12.8 g/dL (ref 12.0–15.0)
Immature Granulocytes: 0 %
Lymphocytes Relative: 21 %
Lymphs Abs: 2.2 10*3/uL (ref 0.7–4.0)
MCH: 26.6 pg (ref 26.0–34.0)
MCHC: 32.3 g/dL (ref 30.0–36.0)
MCV: 82.2 fL (ref 80.0–100.0)
Monocytes Absolute: 0.5 10*3/uL (ref 0.1–1.0)
Monocytes Relative: 5 %
Neutro Abs: 7.2 10*3/uL (ref 1.7–7.7)
Neutrophils Relative %: 67 %
Platelets: 423 10*3/uL — ABNORMAL HIGH (ref 150–400)
RBC: 4.82 MIL/uL (ref 3.87–5.11)
RDW: 13.3 % (ref 11.5–15.5)
WBC: 10.7 10*3/uL — ABNORMAL HIGH (ref 4.0–10.5)
nRBC: 0 % (ref 0.0–0.2)

## 2018-12-29 LAB — BASIC METABOLIC PANEL
Anion gap: 11 (ref 5–15)
BUN: 6 mg/dL (ref 6–20)
CO2: 19 mmol/L — ABNORMAL LOW (ref 22–32)
Calcium: 8.9 mg/dL (ref 8.9–10.3)
Chloride: 107 mmol/L (ref 98–111)
Creatinine, Ser: 0.47 mg/dL (ref 0.44–1.00)
GFR calc Af Amer: >60 mL/min (ref >60)
GFR calc non Af Amer: >60 mL/min (ref >60)
Glucose, Bld: 87 mg/dL (ref 70–99)
Potassium: 3.6 mmol/L (ref 3.5–5.1)
Sodium: 137 mmol/L (ref 135–145)

## 2018-12-29 LAB — SARS CORONAVIRUS 2 BY RT PCR (HOSPITAL ORDER, PERFORMED IN ~~LOC~~ HOSPITAL LAB): SARS Coronavirus 2: NEGATIVE

## 2018-12-29 MED ORDER — ALBUTEROL (5 MG/ML) CONTINUOUS INHALATION SOLN
10.0000 mg/h | INHALATION_SOLUTION | RESPIRATORY_TRACT | Status: AC
  Administered 2018-12-29: 20:00:00 10 mg/h via RESPIRATORY_TRACT
  Filled 2018-12-29: qty 20
  Filled 2018-12-29: qty 40

## 2018-12-29 MED ORDER — METHYLPREDNISOLONE SODIUM SUCC 125 MG IJ SOLR
125.0000 mg | Freq: Once | INTRAMUSCULAR | Status: AC
  Administered 2018-12-29: 125 mg via INTRAVENOUS
  Filled 2018-12-29: qty 2

## 2018-12-29 MED ORDER — MAGNESIUM SULFATE 2 GM/50ML IV SOLN
2.0000 g | Freq: Once | INTRAVENOUS | Status: AC
  Administered 2018-12-29: 19:00:00 2 g via INTRAVENOUS
  Filled 2018-12-29: qty 50

## 2018-12-29 MED ORDER — IPRATROPIUM BROMIDE HFA 17 MCG/ACT IN AERS
4.0000 | INHALATION_SPRAY | Freq: Once | RESPIRATORY_TRACT | Status: DC
  Filled 2018-12-29: qty 12.9

## 2018-12-29 NOTE — ED Notes (Signed)
RT contacted for neb admin.

## 2018-12-29 NOTE — ED Provider Notes (Signed)
Patient has vastly improved following an hour-long neb treatment will have the patient observed until her heart rate improves.  The patient has no wheezing on exam at this time.  Patient most likely can be discharged home will have her ambulate and if she is not showing any signs of desaturation.  Patient's lung reexamination shows no signs of wheezing or tightness in her lungs.   Dalia Heading, PA-C 01/03/19 2254    Blanchie Dessert, MD 01/06/19 1930

## 2018-12-29 NOTE — ED Notes (Signed)
Pt given hot tea ok her RN [employer]

## 2018-12-29 NOTE — ED Notes (Signed)
Pt placed on 5-lead. 

## 2018-12-29 NOTE — ED Notes (Signed)
Pt administered 4 puffs of home prescribed albuterol.

## 2018-12-29 NOTE — ED Triage Notes (Signed)
Pt states she is approx 5 months pregnant.  C/o non-productive cough and SOB since September.  Negative COVID test 2 weeks ago.  Took antibiotics without improvement.

## 2018-12-29 NOTE — ED Provider Notes (Signed)
11:47 PM Patient care assumed from [place], PA-C at shift change. Patient presenting with c/o chest tightness, SOB. Noted to have wheezing throughout on initial exam.  Presently, patient in NAD with clear lung sounds, no hypoxia. HR is 113-118bpm, likely from use of continuous albuterol. Patient noting palpitations as a result, but denies SOB. COVID is negative.  Plan for ambulation on pulse oximetry when tachycardia more improved. Anticipate discharge with burst of oral prednisone and outpatient PCP follow up.   1:03 AM Patient ambulated with sats at or above 98% on RA. HR ranged from 104-111bpm. Patient without c/o SOB. Believe she is stable for discharge at this time. Encouraged PCP follow up. Return precautions provided. Patient discharged in stable condition with no unaddressed concerns.   Antonietta Breach, PA-C 12/30/18 0107    Orpah Greek, MD 12/30/18 [phone]

## 2018-12-29 NOTE — ED Notes (Signed)
Pt requesting something to eat. Ok per [employer], pt give a [place] sandwich, apple sauce, cheese, crackers and graham crackers to eat

## 2018-12-29 NOTE — ED Notes (Signed)
ED Provider at bedside. 

## 2018-12-29 NOTE — ED Provider Notes (Signed)
[facility] EMERGENCY DEPARTMENT Provider Note   CSN: [id number] Arrival date & time: 12/29/18  1509     History   Chief Complaint Chief Complaint  Patient presents with  . Cough  . Shortness of Breath    HPI Dawn Haney is a 33 y.o. female presenting for evaluation of shortness of breath and cough.  Patient states for the past month she has been having persistent shortness of breath and cough.  Patient states she feels like her chest is tight.  Symptoms are worse at night.  She has been using her inhaler without improvement.  As such, she has been using her nebulizer with minimal improvement.  Symptoms are worsening.  Patient reports right-sided chest pain when coughing.  No pain at rest.  No pain with deep inspiration.  She denies sick contacts.  She denies fevers, chills, sore throat, nasal congestion, nausea, vomiting, abdominal pain.  Patient states she has a history of asthma, but is usually not this bad.  Patient states when there is change in weather she often has a flare. Pt states she had a negative covid test 2 wks ago.  Patient is 5 months pregnant, no concerns for the pregnancy as of now, however patient states she is scheduled to have her first OB visit next month.  Patient denies abdominal pain, vaginal bleeding, vaginal discharge.   History obtained with Clydie Braun interpreter via [place] phone interpretative services.     HPI  Past Medical History:  Diagnosis Date  . Asthma   . No pertinent past medical history     Patient Active Problem List   Diagnosis Date Noted  . Preterm delivery, delivered 01/31/2011    Past Surgical History:  Procedure Laterality Date  . NO PAST SURGERIES       OB History    Gravida  2   Para  1   Term  0   Preterm  1   AB  0   Living  1     SAB  0   TAB  0   Ectopic  0   Multiple  0   Live Births  1            Home Medications    Prior to Admission medications   Medication Sig  Start Date End Date Taking? Authorizing Provider  albuterol (VENTOLIN HFA) 108 (90 Base) MCG/ACT inhaler Inhale 1-2 puffs into the lungs every 6 (six) hours as needed for wheezing or shortness of breath. 12/14/18   Hall-Potvin, [place], PA-C  azithromycin (ZITHROMAX) 250 MG tablet Take 1 tablet (250 mg total) by mouth daily. Take first 2 tablets together, then 1 every day until finished. 12/14/18   Hall-Potvin, [place], PA-C  cetirizine (ZYRTEC) 10 MG tablet Take 1 tablet (10 mg total) by mouth daily. 06/13/18   Georgetta Haber, NP  fluticasone (FLONASE) 50 MCG/ACT nasal spray Place 2 sprays into both nostrils daily. 05/07/18   Domenick Gong, MD  ipratropium-albuterol (DUONEB) 0.5-2.5 (3) MG/3ML SOLN Take 3 mLs by nebulization every 4 (four) hours as needed. 12/14/18   Hall-Potvin, [place], PA-C  Spacer/Aero-Holding Chambers (AEROCHAMBER PLUS) inhaler Use as instructed 05/07/18   Domenick Gong, MD    Family History No family history on file.  Social History Social History   Tobacco Use  . Smoking status: Never Smoker  . Smokeless tobacco: Never Used  Substance Use Topics  . Alcohol use: No  . Drug use: No     Allergies  Shrimp [shellfish allergy] and Tylenol [acetaminophen]   Review of Systems Review of Systems  Respiratory: Positive for cough, chest tightness and shortness of breath.   Cardiovascular: Positive for chest pain (when coughing).  All other systems reviewed and are negative.    Physical Exam Updated Vital Signs BP 98/77 (BP Location: Right Arm)   Pulse (!) 102   Temp 99.1 F (37.3 C)   Resp 16   LMP 06/12/2018   SpO2 98%   Physical Exam Vitals signs and nursing note reviewed.  Constitutional:      General: She is not in acute distress.    Appearance: She is well-developed.     Comments: Auditory wheezing heard in the room.  Patient is not in respiratory distress  HENT:     Head: Normocephalic and atraumatic.     Comments: OP clear without  erythema or tonsillar swelling Eyes:     Extraocular Movements: Extraocular movements intact.     Conjunctiva/sclera: Conjunctivae normal.     Pupils: Pupils are equal, round, and reactive to light.  Neck:     Musculoskeletal: Normal range of motion and neck supple.  Cardiovascular:     Rate and Rhythm: Regular rhythm. Tachycardia present.     Pulses: Normal pulses.     Comments: Tachycardic around 105 Pulmonary:     Effort: No respiratory distress.     Breath sounds: Wheezing present.     Comments: Audible wheezes in the room.  Inspiratory and expiratory wheezes in all fields on exam. Frequent cough due to bronchospasm Abdominal:     General: There is no distension.     Palpations: Abdomen is soft. There is no mass.     Tenderness: There is no abdominal tenderness. There is no guarding or rebound.  Musculoskeletal: Normal range of motion.  Skin:    General: Skin is warm and dry.     Capillary Refill: Capillary refill takes less than 2 seconds.  Neurological:     Mental Status: She is alert and oriented to person, place, and time.      ED Treatments / Results  Labs (all labs ordered are listed, but only abnormal results are displayed) Labs Reviewed  SARS CORONAVIRUS 2 BY RT PCR (HOSPITAL ORDER, [facility])  CBC WITH DIFFERENTIAL/PLATELET  BASIC METABOLIC PANEL    EKG None  Radiology No results found.  Procedures Procedures (including critical care time)  Medications Ordered in ED Medications  ipratropium (ATROVENT HFA) inhaler 4 puff (has no administration in time range)  magnesium sulfate IVPB 2 g 50 mL (has no administration in time range)  methylPREDNISolone sodium succinate (SOLU-MEDROL) 125 mg/2 mL injection 125 mg (125 mg Intravenous Given 12/29/18 1813)     Initial Impression / Assessment and Plan / ED Course  I have reviewed the triage vital signs and the nursing notes.  Pertinent labs & imaging results that were available  during my care of the patient were reviewed by me and considered in my medical decision making (see chart for details).        Pt presenting for evaluation of shortness of breath, cough, chest tightness.  Physical exam shows patient is occluding wheezing.  Frequent cough, likely due to bronchospasm.  Concerned about the duration of her symptoms and that they are worsening.  Patient states it has been effective at night.  I am concerned that her home albuterol is not improving her symptoms. Will give albuterol, atrovent, prednisone and mag. Pt may need  to be admitted for asthma exacerbation, especially in the setting of pregnancy.   Pt signed out to [employer] World[employer] Lawyer, PA-C for recheck of breathing after tx. If improved and pt wants d/c, can be d/c with symptomatic tx. If pt is not improved/still wheezing, consider admission for asthma exacerbation.   Final Clinical Impressions(s) / ED Diagnoses   Final diagnoses:  None    ED Discharge Orders    None       Alveria ApleyCaccavale, Khyron Garno, PA-C 12/29/18 Arlis [id number]    Plunkett, Whitney, MD 12/30/18 2332

## 2018-12-30 MED ORDER — PREDNISONE 20 MG PO TABS: 40.0000 mg | ORAL_TABLET | Freq: Every day | ORAL | 0 refills | Status: DC

## 2018-12-30 MED ORDER — ALBUTEROL SULFATE HFA 108 (90 BASE) MCG/ACT IN AERS: 1.0000 | INHALATION_SPRAY | Freq: Four times a day (QID) | RESPIRATORY_TRACT | 1 refills | Status: DC | PRN

## 2018-12-30 NOTE — ED Notes (Signed)
Patient verbalizes understanding of discharge instructions. Opportunity for questioning and answers were provided. Armband removed by staff, pt discharged from ED.  

## 2018-12-30 NOTE — Discharge Instructions (Signed)
Take prednisone as prescribed until finished. Use 2 puffs of an albuterol inhaler every 4-6 hours for management of persistent wheezing, chest tightness, SOB. You may try a teaspoon of honey for cough. You can mix this in a cup with warm water and lemon, if desired. Follow up with a primary care doctor to ensure symptoms resolve.

## 2018-12-30 NOTE — ED Notes (Signed)
Pt ambulated without difficulty or complaint. RA O2 sats dropped from 100% to 98% and then went back to 100%. HR ranged from 104-111 bpm. Pt denied shortness of breath, light headedness, or dizziness.

## 2018-12-31 ENCOUNTER — Encounter: Payer: Self-pay | Admitting: *Deleted

## 2019-01-08 ENCOUNTER — Ambulatory Visit: Payer: Medicaid Other | Admitting: *Deleted

## 2019-01-08 DIAGNOSIS — O0990 Supervision of high risk pregnancy, unspecified, unspecified trimester: Secondary | ICD-10-CM

## 2019-01-08 HISTORY — DX: Supervision of high risk pregnancy, unspecified, unspecified trimester: O09.90

## 2019-01-08 NOTE — Progress Notes (Signed)
I have reviewed this chart and agree with the RN/CMA assessment and management.    K. Meryl Joscelyne Renville, M.D. Attending [facility] (Faculty Practice)   

## 2019-01-08 NOTE — Progress Notes (Unsigned)
I connected with  Dawn Haney on 01/08/19 by a video enabled telemedicine application and verified that I am speaking with the correct person using two identifiers.   I discussed the limitations of evaluation and management by telemedicine. The patient expressed understanding and agreed to proceed.   Use of [employer] [phone]   PRENATAL INTAKE SUMMARY  Dawn Haney presents today New OB Nurse Interview.  OB History    Gravida  3   Para  2   Term  0   Preterm  2   AB  0   Living  2     SAB  0   TAB  0   Ectopic  0   Multiple  0   Live Births  2          I have reviewed the patient's medical, obstetrical, social, and family histories, medications, and available lab results.  SUBJECTIVE She has no unusual complaints  OBJECTIVE Televisit NOB intake  Initial Physical Exam (New OB)  GENERAL APPEARANCE: alert, sounds well over phone call   ASSESSMENT Normal pregnancy  PLAN Prenatal care- [place]

## 2019-01-14 ENCOUNTER — Other Ambulatory Visit: Payer: Self-pay

## 2019-01-14 ENCOUNTER — Other Ambulatory Visit (HOSPITAL_COMMUNITY)
Admission: RE | Admit: 2019-01-14 | Discharge: 2019-01-14 | Disposition: A | Discharge status: Home / Self Care | Payer: Medicaid Other | Source: Ambulatory Visit | Attending: Obstetrics and Gynecology | Admitting: Obstetrics and Gynecology

## 2019-01-14 ENCOUNTER — Encounter: Payer: Self-pay | Admitting: Obstetrics and Gynecology

## 2019-01-14 ENCOUNTER — Ambulatory Visit (INDEPENDENT_AMBULATORY_CARE_PROVIDER_SITE_OTHER): Payer: Medicaid Other | Admitting: Obstetrics and Gynecology

## 2019-01-14 VITALS — BP 110/75 | HR 97 | Wt 127.0 lb

## 2019-01-14 DIAGNOSIS — K831 Obstruction of bile duct: Secondary | ICD-10-CM

## 2019-01-14 DIAGNOSIS — O0990 Supervision of high risk pregnancy, unspecified, unspecified trimester: Secondary | ICD-10-CM

## 2019-01-14 DIAGNOSIS — O09219 Supervision of pregnancy with history of pre-term labor, unspecified trimester: Secondary | ICD-10-CM | POA: Insufficient documentation

## 2019-01-14 DIAGNOSIS — O0992 Supervision of high risk pregnancy, unspecified, second trimester: Secondary | ICD-10-CM | POA: Diagnosis not present

## 2019-01-14 DIAGNOSIS — O09212 Supervision of pregnancy with history of pre-term labor, second trimester: Secondary | ICD-10-CM

## 2019-01-14 DIAGNOSIS — O26619 Liver and biliary tract disorders in pregnancy, unspecified trimester: Secondary | ICD-10-CM

## 2019-01-14 DIAGNOSIS — O26612 Liver and biliary tract disorders in pregnancy, second trimester: Secondary | ICD-10-CM

## 2019-01-14 DIAGNOSIS — Z3A23 23 weeks gestation of pregnancy: Secondary | ICD-10-CM

## 2019-01-14 HISTORY — DX: Supervision of pregnancy with history of pre-term labor, unspecified trimester: O09.219

## 2019-01-14 LAB — OB RESULTS CONSOLE GC/CHLAMYDIA: Gonorrhea: NEGATIVE

## 2019-01-14 NOTE — Progress Notes (Signed)
   Subjective:    Dawn Haney is a [id number] [redacted]w[redacted]d being seen today for her first obstetrical visit.  Her obstetrical history is significant for late onset to care at 23 weeks, 2 previous preterm deliveries both with spontaneous onset. Patient does intend to breast feed. Pregnancy history fully reviewed.  Patient reports no complaints.  Vitals:   01/14/19 0911  BP: 110/75  Pulse: 97  Weight: 127 lb (57.6 kg)    HISTORY: OB History  Gravida Para Term Preterm AB Living  3 2 0 2 0 2  SAB TAB Ectopic Multiple Live Births  0 0 0 0 2    # Outcome Date GA Lbr Len/2nd Weight Sex Delivery Anes PTL Lv  3 Current           2 Preterm 11/05/11 [redacted]w[redacted]d    Vag-Spont   LIV  1 Preterm 12/23/10 [redacted]w[redacted]d 14:03 / 00:21 3 lb 4.6 oz (1.491 kg) F Vag-Spont EPI  LIV   Past Medical History:  Diagnosis Date  . Asthma   . No pertinent past medical history    Past Surgical History:  Procedure Laterality Date  . NO PAST SURGERIES     History reviewed. No pertinent family history.   Exam    Uterus:  Fundal Height: 23 cm  Pelvic Exam:    Perineum: No Hemorrhoids, Normal Perineum   Vulva: normal   Vagina:  normal mucosa, normal discharge   pH:    Cervix: multiparous appearance and cervix with closed internal os, thick   Adnexa: not evaluated   Bony Pelvis: gynecoid  System: Breast:  normal appearance, no masses or tenderness   Skin: normal coloration and turgor, no rashes    Neurologic: oriented, no focal deficits   Extremities: normal strength, tone, and muscle mass   HEENT extra ocular movement intact   Mouth/Teeth mucous membranes moist, pharynx normal without lesions and dental hygiene good   Neck supple and no masses   Cardiovascular: regular rate and rhythm, no murmurs or gallops   Respiratory:  appears well, vitals normal, no respiratory distress, acyanotic, normal RR, chest clear, no wheezing, crepitations, rhonchi, normal symmetric air entry   Abdomen: soft, non-tender; bowel  sounds normal; no masses,  no organomegaly   Urinary:       Assessment:    Pregnancy: [id number] Patient Active Problem List   Diagnosis Date Noted  . Previous preterm delivery, antepartum 01/14/2019  . Supervision of high risk pregnancy, antepartum 01/08/2019  . Preterm delivery, delivered 01/31/2011        Plan:     Initial labs drawn. Prenatal vitamins. Problem list reviewed and updated. Genetic Screening discussed : panorama ordered.  Ultrasound discussed; fetal survey: ordered.  Follow up in 4 weeks for glucola Patient with 2 previous preterm labor- discussed benefits of weekly 17-P and patient desires to receive 50% of 30 min visit spent on counseling and coordination of care.     Dawn Haney 01/14/2019

## 2019-01-14 NOTE — Patient Instructions (Signed)
° °Second Trimester of Pregnancy °The second trimester is from week 14 through week 27 (months 4 through 6). The second trimester is often a time when you feel your best. Your body has adjusted to being pregnant, and you begin to feel better physically. Usually, morning sickness has lessened or quit completely, you may have more energy, and you may have an increase in appetite. The second trimester is also a time when the fetus is growing rapidly. At the end of the sixth month, the fetus is about 9 inches long and weighs about 1½ pounds. You will likely begin to feel the baby move (quickening) between 16 and 20 weeks of pregnancy. °Body changes during your second trimester °Your body continues to go through many changes during your second trimester. The changes vary from woman to woman. °· Your weight will continue to increase. You will notice your lower abdomen bulging out. °· You may begin to get stretch marks on your hips, abdomen, and breasts. °· You may develop headaches that can be relieved by medicines. The medicines should be approved by your health care provider. °· You may urinate more often because the fetus is pressing on your bladder. °· You may develop or continue to have heartburn as a result of your pregnancy. °· You may develop constipation because certain hormones are causing the muscles that push waste through your intestines to slow down. °· You may develop hemorrhoids or swollen, bulging veins (varicose veins). °· You may have back pain. This is caused by: °? Weight gain. °? Pregnancy hormones that are relaxing the joints in your pelvis. °? A shift in weight and the muscles that support your balance. °· Your breasts will continue to grow and they will continue to become tender. °· Your gums may bleed and may be sensitive to brushing and flossing. °· Dark spots or blotches (chloasma, mask of pregnancy) may develop on your face. This will likely fade after the baby is born. °· A dark line from  your belly button to the pubic area (linea nigra) may appear. This will likely fade after the baby is born. °· You may have changes in your hair. These can include thickening of your hair, rapid growth, and changes in texture. Some women also have hair loss during or after pregnancy, or hair that feels dry or thin. Your hair will most likely return to normal after your baby is born. °What to expect at prenatal visits °During a routine prenatal visit: °· You will be weighed to make sure you and the fetus are growing normally. °· Your blood pressure will be taken. °· Your abdomen will be measured to track your baby's growth. °· The fetal heartbeat will be listened to. °· Any test results from the previous visit will be discussed. °Your health care provider may ask you: °· How you are feeling. °· If you are feeling the baby move. °· If you have had any abnormal symptoms, such as leaking fluid, bleeding, severe headaches, or abdominal cramping. °· If you are using any tobacco products, including cigarettes, chewing tobacco, and electronic cigarettes. °· If you have any questions. °Other tests that may be performed during your second trimester include: °· Blood tests that check for: °? Low iron levels (anemia). °? High blood sugar that affects pregnant women (gestational diabetes) between 24 and 28 weeks. °? Rh antibodies. This is to check for a protein on red blood cells (Rh factor). °· Urine tests to check for infections, diabetes, or protein in   the urine. °· An ultrasound to confirm the proper growth and development of the baby. °· An amniocentesis to check for possible genetic problems. °· Fetal screens for spina bifida and Down syndrome. °· HIV (human immunodeficiency virus) testing. Routine prenatal testing includes screening for HIV, unless you choose not to have this test. °Follow these instructions at home: °Medicines °· Follow your health care provider's instructions regarding medicine use. Specific medicines  may be either safe or unsafe to take during pregnancy. °· Take a prenatal vitamin that contains at least 600 micrograms (mcg) of folic acid. °· If you develop constipation, try taking a stool softener if your health care provider approves. °Eating and drinking ° °· Eat a balanced diet that includes fresh fruits and vegetables, whole grains, good sources of protein such as meat, eggs, or tofu, and low-fat dairy. Your health care provider will help you determine the amount of weight gain that is right for you. °· Avoid raw meat and uncooked cheese. These carry germs that can cause birth defects in the baby. °· If you have low calcium intake from food, talk to your health care provider about whether you should take a daily calcium supplement. °· Limit foods that are high in fat and processed sugars, such as fried and sweet foods. °· To prevent constipation: °? Drink enough fluid to keep your urine clear or pale yellow. °? Eat foods that are high in fiber, such as fresh fruits and vegetables, whole grains, and beans. °Activity °· Exercise only as directed by your health care provider. Most women can continue their usual exercise routine during pregnancy. Try to exercise for 30 minutes at least 5 days a week. Stop exercising if you experience uterine contractions. °· Avoid heavy lifting, wear low heel shoes, and practice good posture. °· A sexual relationship may be continued unless your health care provider directs you otherwise. °Relieving pain and discomfort °· Wear a good support bra to prevent discomfort from breast tenderness. °· Take warm sitz baths to soothe any pain or discomfort caused by hemorrhoids. Use hemorrhoid cream if your health care provider approves. °· Rest with your legs elevated if you have leg cramps or low back pain. °· If you develop varicose veins, wear support hose. Elevate your feet for 15 minutes, 3-4 times a day. Limit salt in your diet. °Prenatal Care °· Write down your questions. Take  them to your prenatal visits. °· Keep all your prenatal visits as told by your health care provider. This is important. °Safety °· Wear your seat belt at all times when driving. °· Make a list of emergency phone numbers, including numbers for family, friends, the hospital, and police and fire departments. °General instructions °· Ask your health care provider for a referral to a local prenatal education class. Begin classes no later than the beginning of month 6 of your pregnancy. °· Ask for help if you have counseling or nutritional needs during pregnancy. Your health care provider can offer advice or refer you to specialists for help with various needs. °· Do not use hot tubs, steam rooms, or saunas. °· Do not douche or use tampons or scented sanitary pads. °· Do not cross your legs for long periods of time. °· Avoid cat litter boxes and soil used by cats. These carry germs that can cause birth defects in the baby and possibly loss of the fetus by miscarriage or stillbirth. °· Avoid all smoking, herbs, alcohol, and unprescribed drugs. Chemicals in these products can affect the   formation and growth of the baby.  Do not use any products that contain nicotine or tobacco, such as cigarettes and e-cigarettes. If you need help quitting, ask your health care provider.  Visit your dentist if you have not gone yet during your pregnancy. Use a soft toothbrush to brush your teeth and be gentle when you floss. Contact a health care provider if:  You have dizziness.  You have mild pelvic cramps, pelvic pressure, or nagging pain in the abdominal area.  You have persistent nausea, vomiting, or diarrhea.  You have a bad smelling vaginal discharge.  You have pain when you urinate. Get help right away if:  You have a fever.  You are leaking fluid from your vagina.  You have spotting or bleeding from your vagina.  You have severe abdominal cramping or pain.  You have rapid weight gain or weight loss.  You  have shortness of breath with chest pain.  You notice sudden or extreme swelling of your face, hands, ankles, feet, or legs.  You have not felt your baby move in over an hour.  You have severe headaches that do not go away when you take medicine.  You have vision changes. Summary  The second trimester is from week 14 through week 27 (months 4 through 6). It is also a time when the fetus is growing rapidly.  Your body goes through many changes during pregnancy. The changes vary from woman to woman.  Avoid all smoking, herbs, alcohol, and unprescribed drugs. These chemicals affect the formation and growth your baby.  Do not use any tobacco products, such as cigarettes, chewing tobacco, and e-cigarettes. If you need help quitting, ask your health care provider.  Contact your health care provider if you have any questions. Keep all prenatal visits as told by your health care provider. This is important. This information is not intended to replace advice given to you by your health care provider. Make sure you discuss any questions you have with your health care provider. Document Released: 02/15/2001 Document Revised: 06/15/2018 Document Reviewed: 03/29/2016 Elsevier Patient Education  2020 [employer].   Contraception Choices Contraception, also called birth control, refers to methods or devices that prevent pregnancy. Hormonal methods Contraceptive implant  A contraceptive implant is a thin, plastic tube that contains a hormone. It is inserted into the upper part of the arm. It can remain in place for up to 3 years. Progestin-only injections Progestin-only injections are injections of progestin, a synthetic form of the hormone progesterone. They are given every 3 months by a health care provider. Birth control pills  Birth control pills are pills that contain hormones that prevent pregnancy. They must be taken once a day, preferably at the same time each day. Birth control  patch  The birth control patch contains hormones that prevent pregnancy. It is placed on the skin and must be changed once a week for three weeks and removed on the fourth week. A prescription is needed to use this method of contraception. Vaginal ring  A vaginal ring contains hormones that prevent pregnancy. It is placed in the vagina for three weeks and removed on the fourth week. After that, the process is repeated with a new ring. A prescription is needed to use this method of contraception. Emergency contraceptive Emergency contraceptives prevent pregnancy after unprotected sex. They come in pill form and can be taken up to 5 days after sex. They work best the sooner they are taken after having sex. Most emergency  contraceptives are available without a prescription. This method should not be used as your only form of birth control. Barrier methods Female condom  A female condom is a thin sheath that is worn over the penis during sex. Condoms keep sperm from going inside a woman's body. They can be used with a spermicide to increase their effectiveness. They should be disposed after a single use. Female condom  A female condom is a soft, loose-fitting sheath that is put into the vagina before sex. The condom keeps sperm from going inside a woman's body. They should be disposed after a single use. Diaphragm  A diaphragm is a soft, dome-shaped barrier. It is inserted into the vagina before sex, along with a spermicide. The diaphragm blocks sperm from entering the uterus, and the spermicide kills sperm. A diaphragm should be left in the vagina for 6-8 hours after sex and removed within 24 hours. A diaphragm is prescribed and fitted by a health care provider. A diaphragm should be replaced every 1-2 years, after giving birth, after gaining more than 15 lb (6.8 kg), and after pelvic surgery. Cervical cap  A cervical cap is a round, soft latex or plastic cup that fits over the cervix. It is inserted  into the vagina before sex, along with spermicide. It blocks sperm from entering the uterus. The cap should be left in place for 6-8 hours after sex and removed within 48 hours. A cervical cap must be prescribed and fitted by a health care provider. It should be replaced every 2 years. Sponge  A sponge is a soft, circular piece of polyurethane foam with spermicide on it. The sponge helps block sperm from entering the uterus, and the spermicide kills sperm. To use it, you make it wet and then insert it into the vagina. It should be inserted before sex, left in for at least 6 hours after sex, and removed and thrown away within 30 hours. Spermicides Spermicides are chemicals that kill or block sperm from entering the cervix and uterus. They can come as a cream, jelly, suppository, foam, or tablet. A spermicide should be inserted into the vagina with an applicator at least 10-15 minutes before sex to allow time for it to work. The process must be repeated every time you have sex. Spermicides do not require a prescription. Intrauterine contraception Intrauterine device (IUD) An IUD is a T-shaped device that is put in a woman's uterus. There are two types:  Hormone IUD.This type contains progestin, a synthetic form of the hormone progesterone. This type can stay in place for 3-5 years.  Copper IUD.This type is wrapped in copper wire. It can stay in place for 10 years.  Permanent methods of contraception Female tubal ligation In this method, a woman's fallopian tubes are sealed, tied, or blocked during surgery to prevent eggs from traveling to the uterus. Hysteroscopic sterilization In this method, a small, flexible insert is placed into each fallopian tube. The inserts cause scar tissue to form in the fallopian tubes and block them, so sperm cannot reach an egg. The procedure takes about 3 months to be effective. Another form of birth control must be used during those 3 months. Female sterilization This  is a procedure to tie off the tubes that carry sperm (vasectomy). After the procedure, the man can still ejaculate fluid (semen). Natural planning methods Natural family planning In this method, a couple does not have sex on days when the woman could become pregnant. Calendar method This means keeping track  of the length of each menstrual cycle, identifying the days when pregnancy can happen, and not having sex on those days. °Ovulation method °In this method, a couple avoids sex during ovulation. °Symptothermal method °This method involves not having sex during ovulation. The woman typically checks for ovulation by watching changes in her temperature and in the consistency of cervical mucus. °Post-ovulation method °In this method, a couple waits to have sex until after ovulation. °Summary °· Contraception, also called birth control, means methods or devices that prevent pregnancy. °· Hormonal methods of contraception include implants, injections, pills, patches, vaginal rings, and emergency contraceptives. °· Barrier methods of contraception can include female condoms, female condoms, diaphragms, cervical caps, sponges, and spermicides. °· There are two types of IUDs (intrauterine devices). An IUD can be put in a woman's uterus to prevent pregnancy for 3-5 years. °· Permanent sterilization can be done through a procedure for males, females, or both. °· Natural family planning methods involve not having sex on days when the woman could become pregnant. °This information is not intended to replace advice given to you by your health care provider. Make sure you discuss any questions you have with your health care provider. °Document Released: 02/21/2005 Document Revised: 02/23/2017 Document Reviewed: 03/26/2016 °Elsevier Patient Education © 2020 [employer] ° ° °Breastfeeding ° °Choosing to breastfeed is one of the best decisions you can make for yourself and your baby. A change in hormones during pregnancy  causes your breasts to make breast milk in your milk-producing glands. Hormones prevent breast milk from being released before your baby is born. They also prompt milk flow after birth. Once breastfeeding has begun, thoughts of your baby, as well as his or her sucking or crying, can stimulate the release of milk from your milk-producing glands. °Benefits of breastfeeding °Research shows that breastfeeding offers many health benefits for infants and mothers. It also offers a cost-free and convenient way to feed your baby. °For your baby °· Your first milk (colostrum) helps your baby's digestive system to function better. °· Special cells in your milk (antibodies) help your baby to fight off infections. °· Breastfed babies are less likely to develop asthma, allergies, obesity, or type 2 diabetes. They are also at lower risk for sudden infant death syndrome (SIDS). °· Nutrients in breast milk are better able to meet your baby’s needs compared to infant formula. °· Breast milk improves your baby's brain development. °For you °· Breastfeeding helps to create a very special bond between you and your baby. °· Breastfeeding is convenient. Breast milk costs nothing and is always available at the correct temperature. °· Breastfeeding helps to burn calories. It helps you to lose the weight that you gained during pregnancy. °· Breastfeeding makes your uterus return faster to its size before pregnancy. It also slows bleeding (lochia) after you give birth. °· Breastfeeding helps to lower your risk of developing type 2 diabetes, osteoporosis, rheumatoid arthritis, cardiovascular disease, and breast, ovarian, uterine, and endometrial cancer later in life. °Breastfeeding basics °Starting breastfeeding °· Find a comfortable place to sit or lie down, with your neck and back well-supported. °· Place a pillow or a rolled-up blanket under your baby to bring him or her to the level of your breast (if you are seated). Nursing pillows are  specially designed to help support your arms and your baby while you breastfeed. °· Make sure that your baby's tummy (abdomen) is facing your abdomen. °· Gently massage your breast. With your fingertips, massage from the outer   edges of your breast inward toward the nipple. This encourages milk flow. If your milk flows slowly, you may need to continue this action during the feeding.  Support your breast with 4 fingers underneath and your thumb above your nipple (make the letter "C" with your hand). Make sure your fingers are well away from your nipple and your babys mouth.  Stroke your baby's lips gently with your finger or nipple.  When your baby's mouth is open wide enough, quickly bring your baby to your breast, placing your entire nipple and as much of the areola as possible into your baby's mouth. The areola is the colored area around your nipple. ? More areola should be visible above your baby's upper lip than below the lower lip. ? Your baby's lips should be opened and extended outward (flanged) to ensure an adequate, comfortable latch. ? Your baby's tongue should be between his or her lower gum and your breast.  Make sure that your baby's mouth is correctly positioned around your nipple (latched). Your baby's lips should create a seal on your breast and be turned out (everted).  It is common for your baby to suck about 2-3 minutes in order to start the flow of breast milk. Latching Teaching your baby how to latch onto your breast properly is very important. An improper latch can cause nipple pain, decreased milk supply, and poor weight gain in your baby. Also, if your baby is not latched onto your nipple properly, he or she may swallow some air during feeding. This can make your baby fussy. Burping your baby when you switch breasts during the feeding can help to get rid of the air. However, teaching your baby to latch on properly is still the best way to prevent fussiness from swallowing air  while breastfeeding. Signs that your baby has successfully latched onto your nipple  Silent tugging or silent sucking, without causing you pain. Infant's lips should be extended outward (flanged).  Swallowing heard between every 3-4 sucks once your milk has started to flow (after your let-down milk reflex occurs).  Muscle movement above and in front of his or her ears while sucking. Signs that your baby has not successfully latched onto your nipple  Sucking sounds or smacking sounds from your baby while breastfeeding.  Nipple pain. If you think your baby has not latched on correctly, slip your finger into the corner of your babys mouth to break the suction and place it between your baby's gums. Attempt to start breastfeeding again. Signs of successful breastfeeding Signs from your baby  Your baby will gradually decrease the number of sucks or will completely stop sucking.  Your baby will fall asleep.  Your baby's body will relax.  Your baby will retain a small amount of milk in his or her mouth.  Your baby will let go of your breast by himself or herself. Signs from you  Breasts that have increased in firmness, weight, and size 1-3 hours after feeding.  Breasts that are softer immediately after breastfeeding.  Increased milk volume, as well as a change in milk consistency and color by the fifth day of breastfeeding.  Nipples that are not sore, cracked, or bleeding. Signs that your baby is getting enough milk  Wetting at least 1-2 diapers during the first 24 hours after birth.  Wetting at least 5-6 diapers every 24 hours for the first week after birth. The urine should be clear or pale yellow by the age of 5 days.  Wetting  6-8 diapers every 24 hours as your baby continues to grow and develop.  At least 3 stools in a 24-hour period by the age of 5 days. The stool should be soft and yellow.  At least 3 stools in a 24-hour period by the age of 7 days. The stool should be  seedy and yellow.  No loss of weight greater than 10% of birth weight during the first 3 days of life.  Average weight gain of 4-7 oz (113-198 g) per week after the age of 4 days.  Consistent daily weight gain by the age of 5 days, without weight loss after the age of 2 weeks. After a feeding, your baby may spit up a small amount of milk. This is normal. Breastfeeding frequency and duration Frequent feeding will help you make more milk and can prevent sore nipples and extremely full breasts (breast engorgement). Breastfeed when you feel the need to reduce the fullness of your breasts or when your baby shows signs of hunger. This is called "breastfeeding on demand." Signs that your baby is hungry include:  Increased alertness, activity, or restlessness.  Movement of the head from side to side.  Opening of the mouth when the corner of the mouth or cheek is stroked (rooting).  Increased sucking sounds, smacking lips, cooing, sighing, or squeaking.  Hand-to-mouth movements and sucking on fingers or hands.  Fussing or crying. Avoid introducing a pacifier to your baby in the first 4-6 weeks after your baby is born. After this time, you may choose to use a pacifier. Research has shown that pacifier use during the first year of a baby's life decreases the risk of sudden infant death syndrome (SIDS). Allow your baby to feed on each breast as long as he or she wants. When your baby unlatches or falls asleep while feeding from the first breast, offer the second breast. Because newborns are often sleepy in the first few weeks of life, you may need to awaken your baby to get him or her to feed. Breastfeeding times will vary from baby to baby. However, the following rules can serve as a guide to help you make sure that your baby is properly fed:  Newborns (babies 82 weeks of age or younger) may breastfeed every 1-3 hours.  Newborns should not go without breastfeeding for longer than 3 hours during the  day or 5 hours during the night.  You should breastfeed your baby a minimum of 8 times in a 24-hour period. Breast milk pumping     Pumping and storing breast milk allows you to make sure that your baby is exclusively fed your breast milk, even at times when you are unable to breastfeed. This is especially important if you go back to work while you are still breastfeeding, or if you are not able to be present during feedings. Your lactation consultant can help you find a method of pumping that works best for you and give you guidelines about how long it is safe to store breast milk. Caring for your breasts while you breastfeed Nipples can become dry, cracked, and sore while breastfeeding. The following recommendations can help keep your breasts moisturized and healthy:  Avoid using soap on your nipples.  Wear a supportive bra designed especially for nursing. Avoid wearing underwire-style bras or extremely tight bras (sports bras).  Air-dry your nipples for 3-4 minutes after each feeding.  Use only cotton bra pads to absorb leaked breast milk. Leaking of breast milk between feedings is normal.  Use lanolin on your nipples after breastfeeding. Lanolin helps to maintain your skin's normal moisture barrier. Pure lanolin is not harmful (not toxic) to your baby. You may also hand express a few drops of breast milk and gently massage that milk into your nipples and allow the milk to air-dry. In the first few weeks after giving birth, some women experience breast engorgement. Engorgement can make your breasts feel heavy, warm, and tender to the touch. Engorgement peaks within 3-5 days after you give birth. The following recommendations can help to ease engorgement:  Completely empty your breasts while breastfeeding or pumping. You may want to start by applying warm, moist heat (in the shower or with warm, water-soaked hand towels) just before feeding or pumping. This increases circulation and helps the  milk flow. If your baby does not completely empty your breasts while breastfeeding, pump any extra milk after he or she is finished.  Apply ice packs to your breasts immediately after breastfeeding or pumping, unless this is too uncomfortable for you. To do this: ? Put ice in a plastic bag. ? Place a towel between your skin and the bag. ? Leave the ice on for 20 minutes, 2-3 times a day.  Make sure that your baby is latched on and positioned properly while breastfeeding. If engorgement persists after 48 hours of following these recommendations, contact your health care provider or a Advertising copywriter. Overall health care recommendations while breastfeeding  Eat 3 healthy meals and 3 snacks every day. Well-nourished mothers who are breastfeeding need an additional 450-500 calories a day. You can meet this requirement by increasing the amount of a balanced diet that you eat.  Drink enough water to keep your urine pale yellow or clear.  Rest often, relax, and continue to take your prenatal vitamins to prevent fatigue, stress, and low vitamin and mineral levels in your body (nutrient deficiencies).  Do not use any products that contain nicotine or tobacco, such as cigarettes and e-cigarettes. Your baby may be harmed by chemicals from cigarettes that pass into breast milk and exposure to secondhand smoke. If you need help quitting, ask your health care provider.  Avoid alcohol.  Do not use illegal drugs or marijuana.  Talk with your health care provider before taking any medicines. These include over-the-counter and prescription medicines as well as vitamins and herbal supplements. Some medicines that may be harmful to your baby can pass through breast milk.  It is possible to become pregnant while breastfeeding. If birth control is desired, ask your health care provider about options that will be safe while breastfeeding your baby. Where to find more information: [employer]  International: www.llli.org Contact a health care provider if:  You feel like you want to stop breastfeeding or have become frustrated with breastfeeding.  Your nipples are cracked or bleeding.  Your breasts are red, tender, or warm.  You have: ? Painful breasts or nipples. ? A swollen area on either breast. ? A fever or chills. ? Nausea or vomiting. ? Drainage other than breast milk from your nipples.  Your breasts do not become full before feedings by the fifth day after you give birth.  You feel sad and depressed.  Your baby is: ? Too sleepy to eat well. ? Having trouble sleeping. ? More than 19 week old and wetting fewer than 6 diapers in a 24-hour period. ? Not gaining weight by 42 days of age.  Your baby has fewer than 3 stools in a 24-hour period.  Your baby's skin or the white parts of his or her eyes become yellow. Get help right away if:  Your baby is overly tired (lethargic) and does not want to wake up and feed.  Your baby develops an unexplained fever. Summary  Breastfeeding offers many health benefits for infant and mothers.  Try to breastfeed your infant when he or she shows early signs of hunger.  Gently tickle or stroke your baby's lips with your finger or nipple to allow the baby to open his or her mouth. Bring the baby to your breast. Make sure that much of the areola is in your baby's mouth. Offer one side and burp the baby before you offer the other side.  Talk with your health care provider or lactation consultant if you have questions or you face problems as you breastfeed. This information is not intended to replace advice given to you by your health care provider. Make sure you discuss any questions you have with your health care provider. Document Released: 02/21/2005 Document Revised: 05/18/2017 Document Reviewed: 03/25/2016 Elsevier Patient Education  2020 [employer].

## 2019-01-14 NOTE — Addendum Note (Signed)
Addended by: Cleotilde Neer on: 01/14/2019 11:28 AM   Modules accepted: Orders

## 2019-01-15 LAB — OBSTETRIC PANEL, INCLUDING HIV
Antibody Screen: NEGATIVE
Basophils Absolute: 0.1 10*3/uL (ref 0.0–0.2)
Basos: 1 %
EOS (ABSOLUTE): 0.4 10*3/uL (ref 0.0–0.4)
Eos: 3 %
HIV Screen 4th Generation wRfx: NONREACTIVE
Hematocrit: 36.2 % (ref 34.0–46.6)
Hemoglobin: 11.7 g/dL (ref 11.1–15.9)
Hepatitis B Surface Ag: NEGATIVE
Immature Grans (Abs): 0.1 10*3/uL (ref 0.0–0.1)
Immature Granulocytes: 1 %
Lymphocytes Absolute: 2.2 10*3/uL (ref 0.7–3.1)
Lymphs: 17 %
MCH: 26 pg — ABNORMAL LOW (ref 26.6–33.0)
MCHC: 32.3 g/dL (ref 31.5–35.7)
MCV: 80 fL (ref 79–97)
Monocytes Absolute: 0.5 10*3/uL (ref 0.1–0.9)
Monocytes: 4 %
Neutrophils Absolute: 9.8 10*3/uL — ABNORMAL HIGH (ref 1.4–7.0)
Neutrophils: 74 %
Platelets: 407 10*3/uL (ref 150–450)
RBC: 4.5 x10E6/uL (ref 3.77–5.28)
RDW: 13 % (ref 11.7–15.4)
RPR Ser Ql: NONREACTIVE
Rh Factor: POSITIVE
Rubella Antibodies, IGG: 1.3 index (ref >0.99)
WBC: 13 10*3/uL — ABNORMAL HIGH (ref 3.4–10.8)

## 2019-01-15 LAB — CERVICOVAGINAL ANCILLARY ONLY
Chlamydia: NEGATIVE
Comment: NEGATIVE
Comment: NORMAL
Neisseria Gonorrhea: NEGATIVE

## 2019-01-16 LAB — BILE ACIDS, TOTAL: Bile Acids Total: 12.3 umol/L (ref 0.0–10.0)

## 2019-01-17 DIAGNOSIS — K831 Obstruction of bile duct: Secondary | ICD-10-CM

## 2019-01-17 DIAGNOSIS — O26619 Liver and biliary tract disorders in pregnancy, unspecified trimester: Secondary | ICD-10-CM

## 2019-01-17 DIAGNOSIS — O26649 Intrahepatic cholestasis of pregnancy, unspecified trimester: Secondary | ICD-10-CM

## 2019-01-17 HISTORY — DX: Liver and biliary tract disorders in pregnancy, unspecified trimester: O26.619

## 2019-01-17 HISTORY — DX: Obstruction of bile duct: K83.1

## 2019-01-17 HISTORY — DX: Intrahepatic cholestasis of pregnancy, unspecified trimester: O26.649

## 2019-01-17 LAB — CYTOLOGY - PAP
Comment: NEGATIVE
Diagnosis: NEGATIVE
High risk HPV: NEGATIVE

## 2019-01-17 MED ORDER — URSODIOL 500 MG PO TABS: 500.0000 mg | ORAL_TABLET | Freq: Two times a day (BID) | ORAL | 3 refills | Status: DC

## 2019-01-17 NOTE — Addendum Note (Signed)
Addended by: Mora Bellman on: 01/17/2019 08:30 AM   Modules accepted: Orders

## 2019-01-22 ENCOUNTER — Encounter: Payer: Self-pay | Admitting: Obstetrics and Gynecology

## 2019-02-04 ENCOUNTER — Ambulatory Visit (HOSPITAL_COMMUNITY)
Admission: RE | Admit: 2019-02-04 | Discharge: 2019-02-04 | Disposition: A | Discharge status: Home / Self Care | Payer: Medicaid Other | Source: Ambulatory Visit | Attending: Obstetrics and Gynecology | Admitting: Obstetrics and Gynecology

## 2019-02-04 ENCOUNTER — Encounter: Payer: Self-pay | Admitting: Obstetrics and Gynecology

## 2019-02-07 ENCOUNTER — Encounter: Payer: Self-pay | Admitting: Obstetrics and Gynecology

## 2019-02-07 DIAGNOSIS — D563 Thalassemia minor: Secondary | ICD-10-CM | POA: Insufficient documentation

## 2019-02-11 ENCOUNTER — Encounter: Payer: Self-pay | Admitting: Obstetrics and Gynecology

## 2019-02-11 ENCOUNTER — Ambulatory Visit (INDEPENDENT_AMBULATORY_CARE_PROVIDER_SITE_OTHER): Payer: Medicaid Other | Admitting: Obstetrics and Gynecology

## 2019-02-11 ENCOUNTER — Other Ambulatory Visit: Payer: Medicaid Other

## 2019-02-11 ENCOUNTER — Other Ambulatory Visit: Payer: Self-pay

## 2019-02-11 VITALS — BP 96/67 | HR 85 | Wt 134.0 lb

## 2019-02-11 DIAGNOSIS — K831 Obstruction of bile duct: Secondary | ICD-10-CM

## 2019-02-11 DIAGNOSIS — O09219 Supervision of pregnancy with history of pre-term labor, unspecified trimester: Secondary | ICD-10-CM

## 2019-02-11 DIAGNOSIS — Z3A27 27 weeks gestation of pregnancy: Secondary | ICD-10-CM

## 2019-02-11 DIAGNOSIS — O0992 Supervision of high risk pregnancy, unspecified, second trimester: Secondary | ICD-10-CM

## 2019-02-11 DIAGNOSIS — O26612 Liver and biliary tract disorders in pregnancy, second trimester: Secondary | ICD-10-CM

## 2019-02-11 DIAGNOSIS — O09212 Supervision of pregnancy with history of pre-term labor, second trimester: Secondary | ICD-10-CM

## 2019-02-11 DIAGNOSIS — Z789 Other specified health status: Secondary | ICD-10-CM

## 2019-02-11 DIAGNOSIS — O0990 Supervision of high risk pregnancy, unspecified, unspecified trimester: Secondary | ICD-10-CM

## 2019-02-11 DIAGNOSIS — Z23 Encounter for immunization: Secondary | ICD-10-CM

## 2019-02-11 MED ORDER — BLOOD PRESSURE KIT DEVI: 1.0000 | 0 refills | Status: DC

## 2019-02-11 NOTE — Progress Notes (Addendum)
ROB/GTT.   FLU and TDAP Vaccines given in RD, tolerated well. BP cuff ordered. [place] rescheduled, patient no showed for previous appt.

## 2019-02-11 NOTE — Addendum Note (Signed)
Addended by: Tamela Oddi on: 02/11/2019 11:08 AM   Modules accepted: Orders

## 2019-02-11 NOTE — Progress Notes (Signed)
   PRENATAL VISIT NOTE  Subjective:  Dawn Haney is a 33 y.o. G3P0202 at [redacted]w[redacted]d being seen today for ongoing prenatal care.  She is currently monitored for the following issues for this high-risk pregnancy and has Supervision of high risk pregnancy, antepartum; Previous preterm delivery, antepartum; Cholestasis of pregnancy, antepartum; Alpha thalassemia silent carrier; and Language barrier on their problem list.  Patient reports pain in her right side when she lays down..States it feels achy sometimes, has only happened last few days, when it happens, she sits still and it improves.  Contractions: Not present. Vag. Bleeding: None.  Movement: Present. Denies leaking of fluid.   The following portions of the patient's history were reviewed and updated as appropriate: allergies, current medications, past family history, past medical history, past social history, past surgical history and problem list.   Objective:   Vitals:   02/11/19 0929  BP: 96/67  Pulse: 85  Weight: 134 lb (60.8 kg)    Fetal Status: Fetal Heart Rate (bpm): 154   Movement: Present     General:  Alert, oriented and cooperative. Patient is in no acute distress.  Skin: Skin is warm and dry. No rash noted.   Cardiovascular: Normal heart rate noted  Respiratory: Normal respiratory effort, no problems with respiration noted  Abdomen: Soft, gravid, appropriate for gestational age. Soft, no tenderness over liver or abdomen grossly, no swelling Pain/Pressure: Present     Pelvic: Cervical exam deferred        Extremities: Normal range of motion.  Edema: None  Mental Status: Normal mood and affect. Normal behavior. Normal judgment and thought content.   Assessment and Plan:  Pregnancy: G3P0202 at [redacted]w[redacted]d  1. Supervision of high risk pregnancy, antepartum - Glucose Tolerance, 2 Hours w/1 Hour - CBC - HIV antibody (with reflex) - RPR - Enroll Patient in Babyscripts - Babyscripts Schedule Optimization - needs anatomy  US - Counseled regarding risks/benefits of Tdap vaccine, patient accepts vaccine.  - Counseled regarding risks/benefits of flu vaccine, patient accepts vaccine.  - pt no showed for her anatomy US, it has been rescheduled  2. Cholestasis of pregnancy, antepartum - not taking ursadiol - Comp Met (CMET) - BPP weekly starting 28 weeks - encouraged her to present to hospital with severe pain  3. Previous preterm delivery, antepartum Started care at 23 weeks, has not started, will not start at this point  4. Language barrier Karen interpretor used  Preterm labor symptoms and general obstetric precautions including but not limited to vaginal bleeding, contractions, leaking of fluid and fetal movement were reviewed in detail with the patient. Please refer to After Visit Summary for other counseling recommendations.   Return in about 2 weeks (around 02/25/2019) for high OB, in person.  Future Appointments  Date Time Provider Department Center  02/25/2019  9:00 AM Constant, Peggy, MD [facility] None  03/04/2019  9:00 AM [facility] US 3 [facility]    Kelly M Davis, MD  

## 2019-02-12 LAB — COMPREHENSIVE METABOLIC PANEL
ALT: 18 IU/L (ref 0–32)
AST: 13 IU/L (ref 0–40)
Albumin/Globulin Ratio: 1.1 — ABNORMAL LOW (ref 1.2–2.2)
Albumin: 3.3 g/dL — ABNORMAL LOW (ref 3.8–4.8)
Alkaline Phosphatase: 138 IU/L — ABNORMAL HIGH (ref 39–117)
BUN/Creatinine Ratio: 16 (ref 9–23)
BUN: 9 mg/dL (ref 6–20)
Bilirubin Total: <0.2 mg/dL (ref 0.0–1.2)
CO2: 19 mmol/L — ABNORMAL LOW (ref 20–29)
Calcium: 9 mg/dL (ref 8.7–10.2)
Chloride: 103 mmol/L (ref 96–106)
Creatinine, Ser: 0.55 mg/dL — ABNORMAL LOW (ref 0.57–1.00)
GFR calc Af Amer: 144 mL/min/{1.73_m2} (ref >59)
GFR calc non Af Amer: 125 mL/min/{1.73_m2} (ref >59)
Globulin, Total: 3 g/dL (ref 1.5–4.5)
Glucose: 126 mg/dL — ABNORMAL HIGH (ref 65–99)
Potassium: 3.7 mmol/L (ref 3.5–5.2)
Sodium: 136 mmol/L (ref 134–144)
Total Protein: 6.3 g/dL (ref 6.0–8.5)

## 2019-02-12 LAB — GLUCOSE TOLERANCE, 2 HOURS W/ 1HR
Glucose, 1 hour: 126 mg/dL (ref 65–179)
Glucose, 2 hour: 120 mg/dL (ref 65–152)
Glucose, Fasting: 76 mg/dL (ref 65–91)

## 2019-02-12 LAB — RPR: RPR Ser Ql: NONREACTIVE

## 2019-02-12 LAB — CBC
Hematocrit: 35 % (ref 34.0–46.6)
Hemoglobin: 11.2 g/dL (ref 11.1–15.9)
MCH: 26.1 pg — ABNORMAL LOW (ref 26.6–33.0)
MCHC: 32 g/dL (ref 31.5–35.7)
MCV: 82 fL (ref 79–97)
Platelets: 394 10*3/uL (ref 150–450)
RBC: 4.29 x10E6/uL (ref 3.77–5.28)
RDW: 13.6 % (ref 11.7–15.4)
WBC: 12.7 10*3/uL — ABNORMAL HIGH (ref 3.4–10.8)

## 2019-02-12 LAB — HIV ANTIBODY (ROUTINE TESTING W REFLEX): HIV Screen 4th Generation wRfx: NONREACTIVE

## 2019-02-21 ENCOUNTER — Ambulatory Visit (HOSPITAL_COMMUNITY)
Admission: RE | Admit: 2019-02-21 | Discharge: 2019-02-21 | Disposition: A | Discharge status: Home / Self Care | Payer: Medicaid Other | Source: Ambulatory Visit | Attending: Obstetrics and Gynecology | Admitting: Obstetrics and Gynecology

## 2019-02-21 ENCOUNTER — Other Ambulatory Visit: Payer: Self-pay

## 2019-02-21 ENCOUNTER — Other Ambulatory Visit: Payer: Self-pay | Admitting: Obstetrics and Gynecology

## 2019-02-21 DIAGNOSIS — O0933 Supervision of pregnancy with insufficient antenatal care, third trimester: Secondary | ICD-10-CM

## 2019-02-21 DIAGNOSIS — Z3A28 28 weeks gestation of pregnancy: Secondary | ICD-10-CM

## 2019-02-21 DIAGNOSIS — K831 Obstruction of bile duct: Secondary | ICD-10-CM

## 2019-02-21 DIAGNOSIS — O26613 Liver and biliary tract disorders in pregnancy, third trimester: Secondary | ICD-10-CM

## 2019-02-21 DIAGNOSIS — O26619 Liver and biliary tract disorders in pregnancy, unspecified trimester: Secondary | ICD-10-CM | POA: Diagnosis present

## 2019-02-22 ENCOUNTER — Other Ambulatory Visit (HOSPITAL_COMMUNITY): Payer: Self-pay | Admitting: *Deleted

## 2019-02-22 DIAGNOSIS — K831 Obstruction of bile duct: Secondary | ICD-10-CM

## 2019-02-25 ENCOUNTER — Encounter: Payer: Self-pay | Admitting: Obstetrics and Gynecology

## 2019-02-25 ENCOUNTER — Other Ambulatory Visit (HOSPITAL_COMMUNITY): Payer: Self-pay | Admitting: Obstetrics and Gynecology

## 2019-02-25 ENCOUNTER — Encounter: Payer: Medicaid Other | Admitting: Obstetrics and Gynecology

## 2019-02-25 ENCOUNTER — Ambulatory Visit (INDEPENDENT_AMBULATORY_CARE_PROVIDER_SITE_OTHER): Payer: Medicaid Other | Admitting: Obstetrics and Gynecology

## 2019-02-25 DIAGNOSIS — O0990 Supervision of high risk pregnancy, unspecified, unspecified trimester: Secondary | ICD-10-CM

## 2019-02-25 DIAGNOSIS — Z3A29 29 weeks gestation of pregnancy: Secondary | ICD-10-CM

## 2019-02-25 DIAGNOSIS — Z789 Other specified health status: Secondary | ICD-10-CM

## 2019-02-25 DIAGNOSIS — O09213 Supervision of pregnancy with history of pre-term labor, third trimester: Secondary | ICD-10-CM

## 2019-02-25 DIAGNOSIS — O0993 Supervision of high risk pregnancy, unspecified, third trimester: Secondary | ICD-10-CM

## 2019-02-25 DIAGNOSIS — K831 Obstruction of bile duct: Secondary | ICD-10-CM

## 2019-02-25 DIAGNOSIS — O26619 Liver and biliary tract disorders in pregnancy, unspecified trimester: Secondary | ICD-10-CM

## 2019-02-25 DIAGNOSIS — O26613 Liver and biliary tract disorders in pregnancy, third trimester: Secondary | ICD-10-CM

## 2019-02-25 DIAGNOSIS — O26649 Intrahepatic cholestasis of pregnancy, unspecified trimester: Secondary | ICD-10-CM

## 2019-02-25 DIAGNOSIS — O09219 Supervision of pregnancy with history of pre-term labor, unspecified trimester: Secondary | ICD-10-CM

## 2019-02-25 DIAGNOSIS — Z603 Acculturation difficulty: Secondary | ICD-10-CM

## 2019-02-25 MED ORDER — URSODIOL 500 MG PO TABS: 500.0000 mg | ORAL_TABLET | Freq: Two times a day (BID) | ORAL | 3 refills | Status: DC

## 2019-02-25 NOTE — Progress Notes (Signed)
Pt would like to know about labs today, made aware normal results.   Pt is not currently taking Ursodiol- pt does not know anything about this medication. Pt had u/s last week with placenta previa noted, Please discuss. Pt has f/u u/s scheduled. Pt does not have BP cuff at this time, made aware she may pick up at pharmacy.   Pt would like to know what she may take for gas/bloating.

## 2019-02-25 NOTE — Progress Notes (Signed)
TELEHEALTH OBSTETRICS PRENATAL VIRTUAL VIDEO VISIT ENCOUNTER NOTE  Provider location: [facility]   I connected with Dawn Haney on 02/25/19 at 10:00 AM EST by MyChart Video Encounter at home and verified that I am speaking with the correct person using two identifiers.   I discussed the limitations, risks, security and privacy concerns of performing an evaluation and management service virtually and the availability of in person appointments. I also discussed with the patient that there may be a patient responsible charge related to this service. The patient expressed understanding and agreed to proceed. Subjective:  Dawn Haney is a 33 y.o. Z6X0960 at [redacted]w[redacted]d being seen today for ongoing prenatal care.  She is currently monitored for the following issues for this high-risk pregnancy and has Supervision of high risk pregnancy, antepartum; Previous preterm delivery, antepartum; Cholestasis of pregnancy, antepartum; Alpha thalassemia silent carrier; and Language barrier on their problem list.  Patient reports no complaints.  Contractions: Not present. Vag. Bleeding: None.  Movement: Present. Denies any leaking of fluid.   The following portions of the patient's history were reviewed and updated as appropriate: allergies, current medications, past family history, past medical history, past social history, past surgical history and problem list.   Objective:  There were no vitals filed for this visit.  Fetal Status:     Movement: Present     General:  Alert, oriented and cooperative. Patient is in no acute distress.  Respiratory: Normal respiratory effort, no problems with respiration noted  Mental Status: Normal mood and affect. Normal behavior. Normal judgment and thought content.  Rest of physical exam deferred due to type of encounter  Imaging: [place] MFM OB DETAIL +14 WK  Result Date:  02/21/2019 ----------------------------------------------------------------------  OBSTETRICS REPORT                       (Signed Final 02/21/2019 04:50 pm) ---------------------------------------------------------------------- Patient Info  ID #:       [id number]                          D.O.B.:  07-18-1985 (33 yrs)  Name:       Dawn Haney              Visit Date: 02/21/2019 04:19 pm ---------------------------------------------------------------------- Performed By  Performed By:     Eden Lathe BS      Ref. Address:     [address]                    [address]                                                             [address]                                                             [phone]  [place]                                                             [address]  Attending:        Noralee Space MD        [facility]  Referred By:      [facility] ---------------------------------------------------------------------- Orders   #  Description                          Code         Ordered By   1  [place] MFM OB DETAIL +14 WK              76811.01     KELLY DAVIS  ----------------------------------------------------------------------   #  Order #                    Accession #                 Episode #   1  [id number]                  [id number]                  [id number]  ---------------------------------------------------------------------- Indications   Antenatal screening for malformations          Z36.3   Late to prenatal care, third trimester         O09.33   Cholestasis of pregnancy, third trimester      E95.284X32.4   [redacted] weeks gestation of pregnancy                Z3A.28  ---------------------------------------------------------------------- Fetal Evaluation  Num Of Fetuses:         1  Fetal Heart Rate(bpm):  143  Cardiac Activity:        Observed  Presentation:           Breech  Placenta:               Posterior Previa  P. Cord Insertion:      Visualized  Amniotic Fluid  AFI FV:      Within normal limits  AFI Sum(cm)     %Tile       Largest Pocket(cm)  22.68           93          6.34  RUQ(cm)       RLQ(cm)       LUQ(cm)        LLQ(cm)  5.64          5.46          6.34           5.24 ---------------------------------------------------------------------- Biometry  BPD:      73.1  mm  G. Age:  29w 2d         53  %    CI:        71.14   %    70 - 86                                                          FL/HC:      18.5   %    19.6 - 20.8  HC:      276.1  mm     G. Age:  30w 1d         58  %    HC/AC:      1.08        0.99 - 1.21  AC:       255   mm     G. Age:  29w 5d         69  %    FL/BPD:     69.9   %    71 - 87  FL:       51.1  mm     G. Age:  27w 3d          6  %    FL/AC:      20.0   %    20 - 24  HUM:      46.1  mm     G. Age:  27w 1d          9  %  CER:      35.4  mm     G. Age:  30w 3d         79  %  CM:        5.5  mm  Est. FW:    1310  gm    2 lb 14 oz      40  % ---------------------------------------------------------------------- OB History  Gravidity:    3         Term:   2        Prem:   0        SAB:   0  TOP:          0       Ectopic:  0        Living: 2 ---------------------------------------------------------------------- Gestational Age  LMP:           28w 6d        Date:  08/03/18                 EDD:   05/10/19  U/S Today:     29w 1d                                        EDD:   05/08/19  Best:          28w 6d     Det. By:  LMP  (08/03/18)          EDD:   05/10/19 ---------------------------------------------------------------------- Anatomy  Cranium:               Appears normal         LVOT:  Appears normal  Cavum:                 Appears normal         Aortic Arch:            Appears normal  Ventricles:            Appears normal         Ductal Arch:            Appears normal  Choroid Plexus:         Appears normal         Diaphragm:              Appears normal  Cerebellum:            Appears normal         Stomach:                Appears normal, left                                                                        sided  Posterior Fossa:       Appears normal         Abdomen:                Appears normal  Nuchal Fold:           Appears normal         Abdominal Wall:         Appears nml (cord                                                                        insert, abd wall)  Face:                  Orbits nl; profile not Cord Vessels:           Appears normal (3                         well visualized                                vessel cord)  Lips:                  Appears normal         Kidneys:                Appear normal  Palate:                Not well visualized    Bladder:                Appears normal  Thoracic:              Appears normal         Spine:  Appears normal  Heart:                 Appears normal         Upper Extremities:      Appears normal                         (4CH, axis, and                         situs)  RVOT:                  Appears normal         Lower Extremities:      Appears normal  Other:  Heels/feet and open hands/5th digits visualized. Nasal bone          visualized. ---------------------------------------------------------------------- Cervix Uterus Adnexa  Cervix  Length:            3.7  cm.  Normal appearance by transabdominal scan.  Uterus  No abnormality visualized.  Left Ovary  Within normal limits.  Right Ovary  Within normal limits.  Cul De Sac  No free fluid seen.  Adnexa  No abnormality visualized. ---------------------------------------------------------------------- Impression  G3 P2.  Patient has a diagnosis of cholestasis of pregnancy.  She reports her symptoms of itching have improved.  She is  not taking ursodiol.  Liver enzymes have been within normal  range.  Obstetric history significant for 2 previous term vaginal   deliveries.  Patient does not have gestational diabetes.  She  reports no chronic medical conditions.  On ultrasound, amniotic fluid is normal good fetal activity is  seen.  Fetal growth is appropriate for gestational age.  Fetal  anatomical survey appears normal, but limited by advanced  gestational age.  Incidentally observed antenatal testing is  reassuring (BPP 8/8).  On transabdominal scan placenta previa is seen.  Patient  does not have history of vaginal bleeding. ---------------------------------------------------------------------- Recommendations  -BPP and transvaginal ultrasound for placental assessment  on 03/04/19  -BPP in 3 weeks and then weekly BPP till delivery. ----------------------------------------------------------------------                  Noralee Space, MD Electronically Signed Final Report   02/21/2019 04:50 pm ----------------------------------------------------------------------   Assessment and Plan:  Pregnancy: Z6X0960 at [redacted]w[redacted]d 1. Supervision of high risk pregnancy, antepartum Patient is doing well without complaint Reviewed anatomy ultrasound results and placenta previa reviewed  2. Cholestasis of pregnancy, antepartum Education provided on diagnosis. Patient advised to take ursodiol Informed patient of weekly BPP scheduled to ensure fetal well being and plan for delivery by 37 weeks  3. Language barrier Clydie Braun interpreter used  4. Previous preterm delivery, antepartum Patient previously desired weekly 17-P but has since changed her mind  Preterm labor symptoms and general obstetric precautions including but not limited to vaginal bleeding, contractions, leaking of fluid and fetal movement were reviewed in detail with the patient. I discussed the assessment and treatment plan with the patient. The patient was provided an opportunity to ask questions and all were answered. The patient agreed with the plan and demonstrated an understanding of the instructions. The  patient was advised to call back or seek an in-person office evaluation/go to [facility] at [facility] for any urgent or concerning symptoms. Please refer to After Visit Summary for other counseling recommendations.   I provided 15 minutes of face-to-face time during this encounter.  Return in about 1 week (around 03/04/2019) for in person, High risk, ROB, NST.  Future Appointments  Date Time Provider [place]  03/04/2019  9:00 AM [place] [facility]  03/04/2019  9:00 AM [facility] [place] 3 [facility]US  03/11/2019  3:15 PM [place] [facility]  03/11/2019  3:15 PM [facility] [place] 2 WH-[facility]-US  03/18/2019  3:00 PM [place] NURSE [place] [facility]  03/18/2019  3:00 PM [place] 3 [facility]-US  03/25/2019  3:45 PM [place] [facility]  03/25/2019  3:45 PM [place] 2 WH-M[facility]US [facility]-US    Mora Bellman, MD [facility] for [employer], [place]

## 2019-02-27 ENCOUNTER — Other Ambulatory Visit (HOSPITAL_COMMUNITY): Payer: Medicaid Other

## 2019-03-04 ENCOUNTER — Other Ambulatory Visit: Payer: Self-pay

## 2019-03-04 ENCOUNTER — Ambulatory Visit (HOSPITAL_COMMUNITY)
Admission: RE | Admit: 2019-03-04 | Discharge: 2019-03-04 | Disposition: A | Discharge status: Home / Self Care | Payer: Medicaid Other | Source: Ambulatory Visit | Attending: Obstetrics and Gynecology | Admitting: Obstetrics and Gynecology

## 2019-03-04 ENCOUNTER — Encounter (HOSPITAL_COMMUNITY): Payer: Self-pay

## 2019-03-04 ENCOUNTER — Ambulatory Visit (HOSPITAL_COMMUNITY): Payer: Medicaid Other | Admitting: *Deleted

## 2019-03-04 DIAGNOSIS — O0990 Supervision of high risk pregnancy, unspecified, unspecified trimester: Secondary | ICD-10-CM | POA: Insufficient documentation

## 2019-03-04 DIAGNOSIS — O4403 Placenta previa specified as without hemorrhage, third trimester: Secondary | ICD-10-CM

## 2019-03-04 DIAGNOSIS — Z3A30 30 weeks gestation of pregnancy: Secondary | ICD-10-CM

## 2019-03-04 DIAGNOSIS — K831 Obstruction of bile duct: Secondary | ICD-10-CM

## 2019-03-04 DIAGNOSIS — O09219 Supervision of pregnancy with history of pre-term labor, unspecified trimester: Secondary | ICD-10-CM | POA: Diagnosis present

## 2019-03-04 DIAGNOSIS — O26619 Liver and biliary tract disorders in pregnancy, unspecified trimester: Secondary | ICD-10-CM | POA: Diagnosis present

## 2019-03-04 DIAGNOSIS — D563 Thalassemia minor: Secondary | ICD-10-CM | POA: Insufficient documentation

## 2019-03-04 DIAGNOSIS — O26613 Liver and biliary tract disorders in pregnancy, third trimester: Secondary | ICD-10-CM

## 2019-03-04 DIAGNOSIS — O0933 Supervision of pregnancy with insufficient antenatal care, third trimester: Secondary | ICD-10-CM | POA: Diagnosis not present

## 2019-03-07 ENCOUNTER — Encounter: Payer: Medicaid Other | Admitting: Obstetrics and Gynecology

## 2019-03-07 ENCOUNTER — Emergency Department (HOSPITAL_COMMUNITY): Payer: Medicaid Other

## 2019-03-07 ENCOUNTER — Other Ambulatory Visit: Payer: Self-pay

## 2019-03-07 ENCOUNTER — Inpatient Hospital Stay (HOSPITAL_COMMUNITY)
Admission: EM | Admit: 2019-03-07 | Discharge: 2019-03-10 | DRG: 831 | Disposition: A | Discharge status: Home / Self Care | Payer: Medicaid Other | Attending: Internal Medicine | Admitting: Internal Medicine

## 2019-03-07 ENCOUNTER — Encounter (HOSPITAL_COMMUNITY): Payer: Self-pay | Admitting: *Deleted

## 2019-03-07 DIAGNOSIS — O0993 Supervision of high risk pregnancy, unspecified, third trimester: Secondary | ICD-10-CM | POA: Diagnosis not present

## 2019-03-07 DIAGNOSIS — O99513 Diseases of the respiratory system complicating pregnancy, third trimester: Secondary | ICD-10-CM | POA: Diagnosis present

## 2019-03-07 DIAGNOSIS — O4400 Placenta previa specified as without hemorrhage, unspecified trimester: Secondary | ICD-10-CM | POA: Diagnosis not present

## 2019-03-07 DIAGNOSIS — J209 Acute bronchitis, unspecified: Secondary | ICD-10-CM | POA: Diagnosis not present

## 2019-03-07 DIAGNOSIS — J9601 Acute respiratory failure with hypoxia: Secondary | ICD-10-CM | POA: Diagnosis present

## 2019-03-07 DIAGNOSIS — Z888 Allergy status to other drugs, medicaments and biological substances status: Secondary | ICD-10-CM | POA: Diagnosis not present

## 2019-03-07 DIAGNOSIS — J40 Bronchitis, not specified as acute or chronic: Secondary | ICD-10-CM | POA: Diagnosis present

## 2019-03-07 DIAGNOSIS — D563 Thalassemia minor: Secondary | ICD-10-CM | POA: Diagnosis present

## 2019-03-07 DIAGNOSIS — Z789 Other specified health status: Secondary | ICD-10-CM | POA: Diagnosis present

## 2019-03-07 DIAGNOSIS — O09213 Supervision of pregnancy with history of pre-term labor, third trimester: Secondary | ICD-10-CM

## 2019-03-07 DIAGNOSIS — J45909 Unspecified asthma, uncomplicated: Secondary | ICD-10-CM | POA: Diagnosis present

## 2019-03-07 DIAGNOSIS — O26619 Liver and biliary tract disorders in pregnancy, unspecified trimester: Secondary | ICD-10-CM

## 2019-03-07 DIAGNOSIS — Z3A30 30 weeks gestation of pregnancy: Secondary | ICD-10-CM

## 2019-03-07 DIAGNOSIS — Z20822 Contact with and (suspected) exposure to covid-19: Secondary | ICD-10-CM | POA: Diagnosis present

## 2019-03-07 DIAGNOSIS — O99283 Endocrine, nutritional and metabolic diseases complicating pregnancy, third trimester: Secondary | ICD-10-CM | POA: Diagnosis present

## 2019-03-07 DIAGNOSIS — J4541 Moderate persistent asthma with (acute) exacerbation: Secondary | ICD-10-CM | POA: Diagnosis present

## 2019-03-07 DIAGNOSIS — Z3A31 31 weeks gestation of pregnancy: Secondary | ICD-10-CM

## 2019-03-07 DIAGNOSIS — O4403 Placenta previa specified as without hemorrhage, third trimester: Secondary | ICD-10-CM | POA: Diagnosis present

## 2019-03-07 DIAGNOSIS — O26613 Liver and biliary tract disorders in pregnancy, third trimester: Secondary | ICD-10-CM | POA: Diagnosis present

## 2019-03-07 DIAGNOSIS — Z91013 Allergy to seafood: Secondary | ICD-10-CM

## 2019-03-07 DIAGNOSIS — E876 Hypokalemia: Secondary | ICD-10-CM | POA: Diagnosis present

## 2019-03-07 DIAGNOSIS — Z3689 Encounter for other specified antenatal screening: Secondary | ICD-10-CM | POA: Diagnosis not present

## 2019-03-07 DIAGNOSIS — K831 Obstruction of bile duct: Secondary | ICD-10-CM | POA: Diagnosis present

## 2019-03-07 DIAGNOSIS — Z79899 Other long term (current) drug therapy: Secondary | ICD-10-CM

## 2019-03-07 DIAGNOSIS — O0990 Supervision of high risk pregnancy, unspecified, unspecified trimester: Secondary | ICD-10-CM

## 2019-03-07 HISTORY — DX: Acute respiratory failure with hypoxia: J96.01

## 2019-03-07 HISTORY — DX: Complete placenta previa nos or without hemorrhage, unspecified trimester: O44.00

## 2019-03-07 HISTORY — DX: Unspecified asthma, uncomplicated: J45.909

## 2019-03-07 LAB — CBC WITH DIFFERENTIAL/PLATELET
Abs Immature Granulocytes: 0.06 10*3/uL (ref 0.00–0.07)
Basophils Absolute: 0.1 10*3/uL (ref 0.0–0.1)
Basophils Relative: 1 %
Eosinophils Absolute: 0.3 10*3/uL (ref 0.0–0.5)
Eosinophils Relative: 2 %
HCT: 33.9 % — ABNORMAL LOW (ref 36.0–46.0)
Hemoglobin: 10.7 g/dL — ABNORMAL LOW (ref 12.0–15.0)
Immature Granulocytes: 1 %
Lymphocytes Relative: 12 %
Lymphs Abs: 1.6 10*3/uL (ref 0.7–4.0)
MCH: 25.7 pg — ABNORMAL LOW (ref 26.0–34.0)
MCHC: 31.6 g/dL (ref 30.0–36.0)
MCV: 81.5 fL (ref 80.0–100.0)
Monocytes Absolute: 0.8 10*3/uL (ref 0.1–1.0)
Monocytes Relative: 6 %
Neutro Abs: 9.8 10*3/uL — ABNORMAL HIGH (ref 1.7–7.7)
Neutrophils Relative %: 78 %
Platelets: 334 10*3/uL (ref 150–400)
RBC: 4.16 MIL/uL (ref 3.87–5.11)
RDW: 14.3 % (ref 11.5–15.5)
WBC: 12.5 10*3/uL — ABNORMAL HIGH (ref 4.0–10.5)
nRBC: 0 % (ref 0.0–0.2)

## 2019-03-07 LAB — MRSA PCR SCREENING: MRSA by PCR: NEGATIVE

## 2019-03-07 LAB — BASIC METABOLIC PANEL
Anion gap: 11 (ref 5–15)
BUN: 6 mg/dL (ref 6–20)
CO2: 19 mmol/L — ABNORMAL LOW (ref 22–32)
Calcium: 8.6 mg/dL — ABNORMAL LOW (ref 8.9–10.3)
Chloride: 107 mmol/L (ref 98–111)
Creatinine, Ser: 0.52 mg/dL (ref 0.44–1.00)
GFR calc Af Amer: >60 mL/min (ref >60)
GFR calc non Af Amer: >60 mL/min (ref >60)
Glucose, Bld: 125 mg/dL — ABNORMAL HIGH (ref 70–99)
Potassium: 3.2 mmol/L — ABNORMAL LOW (ref 3.5–5.1)
Sodium: 137 mmol/L (ref 135–145)

## 2019-03-07 LAB — RESPIRATORY PANEL BY RT PCR (FLU A&B, COVID)
Influenza A by PCR: NEGATIVE
Influenza B by PCR: NEGATIVE
SARS Coronavirus 2 by RT PCR: NEGATIVE

## 2019-03-07 LAB — POC SARS CORONAVIRUS 2 AG -  ED: SARS Coronavirus 2 Ag: NEGATIVE

## 2019-03-07 LAB — GLUCOSE, CAPILLARY: Glucose-Capillary: 137 mg/dL — ABNORMAL HIGH (ref 70–99)

## 2019-03-07 MED ORDER — ENOXAPARIN SODIUM 40 MG/0.4ML ~~LOC~~ SOLN
40.0000 mg | SUBCUTANEOUS | Status: DC
  Administered 2019-03-07 – 2019-03-10 (×4): 40 mg via SUBCUTANEOUS
  Filled 2019-03-07 (×4): qty 0.4

## 2019-03-07 MED ORDER — IOHEXOL 350 MG/ML SOLN
75.0000 mL | Freq: Once | INTRAVENOUS | Status: AC | PRN
  Administered 2019-03-07: 75 mL via INTRAVENOUS

## 2019-03-07 MED ORDER — TERBUTALINE SULFATE 1 MG/ML IJ SOLN
0.2500 mg | Freq: Once | INTRAMUSCULAR | Status: AC
  Administered 2019-03-07: 07:00:00 0.25 mg via SUBCUTANEOUS
  Filled 2019-03-07: qty 1

## 2019-03-07 MED ORDER — MAGNESIUM SULFATE 2 GM/50ML IV SOLN
2.0000 g | Freq: Once | INTRAVENOUS | Status: AC
  Administered 2019-03-07: 03:00:00 2 g via INTRAVENOUS
  Filled 2019-03-07: qty 50

## 2019-03-07 MED ORDER — ALBUTEROL SULFATE (2.5 MG/3ML) 0.083% IN NEBU: 2.5000 mg | INHALATION_SOLUTION | RESPIRATORY_TRACT | Status: DC | PRN

## 2019-03-07 MED ORDER — GUAIFENESIN ER 600 MG PO TB12
600.0000 mg | ORAL_TABLET | Freq: Two times a day (BID) | ORAL | Status: DC
  Administered 2019-03-07 – 2019-03-10 (×6): 600 mg via ORAL
  Filled 2019-03-07 (×8): qty 1

## 2019-03-07 MED ORDER — ALBUTEROL (5 MG/ML) CONTINUOUS INHALATION SOLN
15.0000 mg/h | INHALATION_SOLUTION | Freq: Once | RESPIRATORY_TRACT | Status: AC
  Administered 2019-03-07: 06:00:00 15 mg/h via RESPIRATORY_TRACT
  Filled 2019-03-07: qty 20

## 2019-03-07 MED ORDER — METHYLPREDNISOLONE SODIUM SUCC 125 MG IJ SOLR
60.0000 mg | Freq: Three times a day (TID) | INTRAMUSCULAR | Status: DC
  Administered 2019-03-07 – 2019-03-10 (×9): 60 mg via INTRAVENOUS
  Filled 2019-03-07 (×8): qty 2

## 2019-03-07 MED ORDER — ALBUTEROL SULFATE HFA 108 (90 BASE) MCG/ACT IN AERS
6.0000 | INHALATION_SPRAY | Freq: Once | RESPIRATORY_TRACT | Status: AC
  Administered 2019-03-07: 03:00:00 6 via RESPIRATORY_TRACT
  Filled 2019-03-07: qty 6.7

## 2019-03-07 MED ORDER — LORATADINE 10 MG PO TABS
10.0000 mg | ORAL_TABLET | Freq: Every day | ORAL | Status: DC
  Administered 2019-03-08 – 2019-03-10 (×3): 10 mg via ORAL
  Filled 2019-03-07 (×4): qty 1

## 2019-03-07 MED ORDER — SODIUM CHLORIDE 0.9 % IV SOLN: INTRAVENOUS | Status: DC | PRN

## 2019-03-07 MED ORDER — METHYLPREDNISOLONE SODIUM SUCC 125 MG IJ SOLR
60.0000 mg | Freq: Three times a day (TID) | INTRAMUSCULAR | Status: DC
  Filled 2019-03-07: qty 2

## 2019-03-07 MED ORDER — ALBUTEROL SULFATE (2.5 MG/3ML) 0.083% IN NEBU
2.5000 mg | INHALATION_SOLUTION | Freq: Four times a day (QID) | RESPIRATORY_TRACT | Status: DC
  Administered 2019-03-07 – 2019-03-10 (×15): 2.5 mg via RESPIRATORY_TRACT
  Filled 2019-03-07 (×14): qty 3

## 2019-03-07 MED ORDER — CYCLOBENZAPRINE HCL 10 MG PO TABS
5.0000 mg | ORAL_TABLET | Freq: Three times a day (TID) | ORAL | Status: DC | PRN
  Administered 2019-03-07: 5 mg via ORAL
  Filled 2019-03-07: qty 1

## 2019-03-07 MED ORDER — CHLORHEXIDINE GLUCONATE CLOTH 2 % EX PADS
6.0000 | MEDICATED_PAD | Freq: Every day | CUTANEOUS | Status: DC
  Administered 2019-03-07 – 2019-03-10 (×4): 6 via TOPICAL

## 2019-03-07 MED ORDER — PREDNISONE 20 MG PO TABS
60.0000 mg | ORAL_TABLET | Freq: Once | ORAL | Status: AC
  Administered 2019-03-07: 03:00:00 60 mg via ORAL
  Filled 2019-03-07: qty 3

## 2019-03-07 MED ORDER — POTASSIUM CHLORIDE 10 MEQ/100ML IV SOLN
10.0000 meq | INTRAVENOUS | Status: AC
  Administered 2019-03-07 (×3): 10 meq via INTRAVENOUS
  Filled 2019-03-07 (×3): qty 100

## 2019-03-07 MED ORDER — ALBUTEROL SULFATE HFA 108 (90 BASE) MCG/ACT IN AERS
6.0000 | INHALATION_SPRAY | Freq: Once | RESPIRATORY_TRACT | Status: AC
  Administered 2019-03-07: 6 via RESPIRATORY_TRACT
  Filled 2019-03-07: qty 6.7

## 2019-03-07 MED ORDER — SODIUM CHLORIDE 0.9 % IV SOLN
1.0000 g | INTRAVENOUS | Status: DC
  Administered 2019-03-07 – 2019-03-10 (×4): 1 g via INTRAVENOUS
  Filled 2019-03-07: qty 10
  Filled 2019-03-07: qty 1
  Filled 2019-03-07: qty 10
  Filled 2019-03-07: qty 1
  Filled 2019-03-07: qty 10

## 2019-03-07 NOTE — Progress Notes (Signed)
I confirmed with Dr. Roselie Awkward of [facility] that patient would need C-EFM. Notified [place] [facility] and [facility] charge nurse, April so that arrangements can be made for patient placement on [facility] with C-EFM capability. Once C-EFM and OBIX capture is set up, [facility] RNs will assume remote C-EFM with EFM and OB interventions as needed.

## 2019-03-07 NOTE — H&P (Addendum)
Obstetrics H&P Note  03/07/2019 - 7:03 AM Primary OBGYN: [facility]  Chief Complaint: asthma exacerbation  History of Present Illness  33 y.o. [id number] @ [redacted]w[redacted]d, with the above CC. Pregnancy complicated by: asthma, h/o preterm birth x 2, cholestasis of pregnancy, posterior previa, language barrier  Patient to ED with SOB and also having some URI s/s. Patient currently sleeping. See ED for H&P.   In the ED her situation deteriorated and she was given IV Mg 2gm x 1, PO prednisone, terbutaline, albuterol, duonebs and is currently on bipap  Review of Systems: as noted in the History of Present Illness.  Patient Active Problem List   Diagnosis Date Noted  . Placenta previa 03/07/2019  . Asthma affecting pregnancy in third trimester 03/07/2019  . Language barrier 02/11/2019  . Alpha thalassemia silent carrier 02/07/2019  . Cholestasis of pregnancy, antepartum 01/17/2019  . Previous preterm delivery, antepartum 01/14/2019  . Supervision of high risk pregnancy, antepartum 01/08/2019    PMHx:  Past Medical History:  Diagnosis Date  . Asthma    PSHx:  Past Surgical History:  Procedure Laterality Date  . NO PAST SURGERIES     Medications: prenatal vitamin  Allergies: is allergic to shrimp [shellfish allergy] and tylenol [acetaminophen]. OBHx:  OB History  Gravida Para Term Preterm AB Living  3 2 0 2 0 2  SAB TAB Ectopic Multiple Live Births  0 0 0 0 2    # Outcome Date GA Lbr Len/2nd Weight Sex Delivery Anes PTL Lv  3 Current           2 Preterm 11/05/11 [redacted]w[redacted]d    Vag-Spont   LIV  1 Preterm 12/23/10 [redacted]w[redacted]d 14:03 / 00:21 1491 g F Vag-Spont EPI  LIV        FHx: No family history on file. Soc Hx:  Social History   Socioeconomic History  . Marital status: Married    Spouse name: Not on file  . Number of children: Not on file  . Years of education: Not on file  . Highest education level: Not on file  Occupational History  . Not on file  Tobacco Use  . Smoking status: Never  Smoker  . Smokeless tobacco: Never Used  Substance and Sexual Activity  . Alcohol use: No  . Drug use: No  . Sexual activity: Not Currently    Birth control/protection: None  Other Topics Concern  . Not on file  Social History Narrative  . Not on file   Social Determinants of Health   Financial Resource Strain:   . Difficulty of Paying Living Expenses: Not on file  Food Insecurity:   . Worried About Programme researcher, broadcasting/film/video in the Last Year: Not on file  . Ran Out of Food in the Last Year: Not on file  Transportation Needs:   . Lack of Transportation (Medical): Not on file  . Lack of Transportation (Non-Medical): Not on file  Physical Activity:   . Days of Exercise per Week: Not on file  . Minutes of Exercise per Session: Not on file  Stress:   . Feeling of Stress : Not on file  Social Connections:   . Frequency of Communication with Friends and Family: Not on file  . Frequency of Social Gatherings with Friends and Family: Not on file  . Attends Religious Services: Not on file  . Active Member of Clubs or Organizations: Not on file  . Attends Banker Meetings: Not on file  . Marital Status:  Not on file  Intimate Partner Violence:   . Fear of Current or Ex-Partner: Not on file  . Emotionally Abused: Not on file  . Physically Abused: Not on file  . Sexually Abused: Not on file    Objective    Current Vital Signs 24h Vital Sign Ranges  T (!) 97.5 F (36.4 C) Temp  Avg: 97.5 F (36.4 C)  Min: 97.5 F (36.4 C)  Max: 97.5 F (36.4 C)  BP 115/73 BP  Min: 90/60  Max: 115/73  HR (!) 139 Pulse  Avg: 127.2  Min: 119  Max: 139  RR (!) 32 Resp  Avg: 37.4  Min: 22  Max: 57  SaO2 98 % Bi-PAP(with neb) SpO2  Avg: 92.9 %  Min: 85 %  Max: 99 %       24 Hour I/O Current Shift I/O  Time Ins Outs No intake/output data recorded. No intake/output data recorded.   Patient Vitals for the past 24 hrs:  BP Temp Temp src Pulse Resp SpO2 Height Weight  03/07/19 0700 115/73 - -  (!) 139 (!) 32 98 % - -  03/07/19 0646 100/72 - - (!) 129 (!) 35 97 % - -  03/07/19 0630 102/66 - - (!) 128 (!) 32 98 % - -  03/07/19 0625 - - - - - 97 % - -  03/07/19 0615 115/70 - - (!) 119 (!) 47 95 % - -  03/07/19 0606 - - - - - 97 % - -  03/07/19 0545 108/69 - - (!) 119 (!) 57 99 % - -  03/07/19 0510 - - - (!) 122 (!) 44 (!) 85 % - -  03/07/19 0430 90/60 - - (!) 130 - (!) 89 % - -  03/07/19 0415 94/68 - - (!) 128 - (!) 89 % - -  03/07/19 0400 98/66 - - (!) 126 - (!) 89 % - -  03/07/19 0345 107/69 - - (!) 123 - 93 % - -  03/07/19 0341 - - - (!) 122 (!) 30 92 % - -  03/07/19 0330 94/63 - - (!) 126 - 90 % - -  03/07/19 0315 99/70 - - (!) 134 - (!) 88 % - -  03/07/19 0302 - - - - - - 5' (1.524 m) 61.2 kg  03/07/19 0301 111/78 (!) 97.5 F (36.4 C) Oral (!) 136 (!) 22 90 % - -   Per RR OB RN: category I, slightly tachy in the 160s, contractions quiet  General: Well nourished, well developed female in no acute distress.  Respiratory:  +accessory muscle use Neuro/Psych:  sleeping  Labs Recent Labs  Lab 03/07/19 0315  WBC 12.5*  HGB 10.7*  HCT 33.9*  PLT 334    Recent Labs  Lab 03/07/19 0315  NA 137  K 3.2*  CL 107  CO2 19*  BUN 6  CREATININE 0.52  CALCIUM 8.6*  GLUCOSE 125*    Radiology  CLINICAL DATA:  Shortness of breath for 3 days. History of asthma and third trimester pregnancy.  EXAM: CT ANGIOGRAPHY CHEST WITH CONTRAST  TECHNIQUE: Multidetector CT imaging of the chest was performed using the standard protocol during bolus administration of intravenous contrast. Multiplanar CT image reconstructions and MIPs were obtained to evaluate the vascular anatomy.  CONTRAST:  1mL OMNIPAQUE IOHEXOL 350 MG/ML SOLN  COMPARISON:  None.  FINDINGS: Cardiovascular: Adequate pulmonary artery opacification but significantly limited by motion artifact. No evidence of pulmonary embolism. Normal heart size.  Normal aorta.  Mediastinum/Nodes: Remote  granulomatous calcification at the left hilum and mediastinum.  Lungs/Pleura: Streaky atelectasis in the left lower lobe with airway narrowings. Small focus of airspace opacity in the right upper lobe. Asymmetric upper lobe aeration, more lucent on the left.  Upper Abdomen: Negative  Musculoskeletal: Negative  Review of the MIP images confirms the above findings.  IMPRESSION: 1. Airway thickening with patchy atelectasis and small focus of right upper lobe airspace disease. Findings correlate with history of asthma; there could be superimposed infectious bronchitis. 2. Respiratory motion limits pulmonary artery evaluation. No evidence of pulmonary embolism. 3. Remote granulomatous disease.   Electronically Signed   By: Jonathon  Watts M.D.   On: 03/07/2019 06:24  12/7: efw 40%, ac 69%  Assessment & PlaMarnee Springn   33 y.o. [id number] @ 444w6d with asthma exacerbation. Pt currently stable *Pregnancy: fetal status reassuring. Continuous monitoring *Asthma: admit to OB and progressive care unit with Hospitalist service managing respiratory issues. D/w them that all meds for asthma exacerbation are safe for pregnancy and if any questions please let me know and she is doing better and  plan for continuing steroids.. Pt maintaing O2 sats at 95 or greater which is goal. If patient is needing intubation will d/w mfm and pt re: if preterm delivery would be benefit her at some point *ID: if hospitalists feel tx for ?infectious bronchitis would be beneficial, avoid fluoroquinolones, tetracyclines; please, let us know if any abx questions.  *Previa: no current issues *Cholestasis of pregnancy: no current issues  Cornelia Copaharlie Morris Longenecker, Jr MD Attending Center for [facility] GYN Consult Phone: [phone] (M-F, [phone]) & [phone] (Off hours, weekends, holidays)

## 2019-03-07 NOTE — Progress Notes (Addendum)
NRB removed. Patient place on NIV due to acute hypoxic respiratory failure and tachypnea. MD Horton made aware of sustained oxygen desaturation event. Supraclavicular retractions noted. BBS to auscultation reveals good air entry aeration on the right lung fields, but LEFT is decrease and airflow limitation throughout. Patient has an O2 requirement at this time.

## 2019-03-07 NOTE — ED Notes (Signed)
Rapid response OB nurse at bedside , O2 sat = 86% 4 lpm/[place] . FHR = 156/min.

## 2019-03-07 NOTE — ED Notes (Signed)
NRB mask applied at 15 lpm , O2 sat improved to 92%. CT angio chest ordered.

## 2019-03-07 NOTE — Progress Notes (Signed)
OB attending Dr. Ilda Basset at bedside with patient. Pt. To be admitted to ICU on Bipap. Pt. To be continuous fetal monitoring upon admission. Pt. Unaware of ctx. Fetal tachycardia.

## 2019-03-07 NOTE — Progress Notes (Signed)
Spoke with Dr. Roselie Awkward about POC for patient's fetal monitoring. Will change to intermittent NST's.  OBSCU charge nurse, [place] nurse and 84M charge nurse aware. Patient placement aware-awaiting bed request for PCU.

## 2019-03-07 NOTE — Progress Notes (Signed)
Pt. Presents to ED for SOB/asthma exacerbation. Pt. Hx of Asthma and has been trying treatments at home. Pt. Seen by faculty practice for high risk pregnancy r/t cholestasis, hx of preterm (31/32 weeks) delivery, and placenta previa. Pt. Endorses fetal movement, but denies vaginal bleeding or leaking of fluids.   Pt. 84% on room air, RR 50, and HR 150's. Pt. Started on NRB mask and CT scan ordered to rule out PE.   OB Dr. Ilda Basset made aware of patient. Continuous monitoring at this time. ED provider at bedside.

## 2019-03-07 NOTE — ED Provider Notes (Signed)
[facility] EMERGENCY DEPARTMENT Provider Note   CSN: [id number] Arrival date & time: 03/07/19  0258     History Chief Complaint  Patient presents with  . Shortness of Breath    Bobie Filley is a 33 y.o. female.  HPI     This is a 33 year old G3, P2 female approximately 30 weeks and 6 days pregnant who presents with shortness of breath.  Patient reports onset of upper respiratory symptoms 2 days ago including cough and runny nose.  Patient with a history of asthma.  She has been using her albuterol at home with minimal relief.  She denies any fevers or chills.  No known sick contacts or Covid exposures.  She denies any abdominal pain, contractions, vaginal bleeding, vaginal discharge.  Attempted to use [place] interpreter; however, none available.  Patient speaks fair [place].  Past Medical History:  Diagnosis Date  . Asthma   . No pertinent past medical history     Patient Active Problem List   Diagnosis Date Noted  . Language barrier 02/11/2019  . Alpha thalassemia silent carrier 02/07/2019  . Cholestasis of pregnancy, antepartum 01/17/2019  . Previous preterm delivery, antepartum 01/14/2019  . Supervision of high risk pregnancy, antepartum 01/08/2019    Past Surgical History:  Procedure Laterality Date  . NO PAST SURGERIES       OB History    Gravida  3   Para  2   Term  0   Preterm  2   AB  0   Living  2     SAB  0   TAB  0   Ectopic  0   Multiple  0   Live Births  2           No family history on file.  Social History   Tobacco Use  . Smoking status: Never Smoker  . Smokeless tobacco: Never Used  Substance Use Topics  . Alcohol use: No  . Drug use: No    Home Medications Prior to Admission medications   Medication Sig Start Date End Date Taking? Authorizing Provider  albuterol (VENTOLIN HFA) 108 (90 Base) MCG/ACT inhaler Inhale 1-2 puffs into the lungs every 6 (six) hours as needed for wheezing or  shortness of breath. 12/30/18   Antonietta Breach, PA-C  Blood Pressure Monitoring (BLOOD PRESSURE KIT) DEVI 1 kit by Does not apply route once a week. Check BP regularly and record readings into the [employer] App.  Large Cuff.  Dx O90.0 02/11/19   Sloan Leiter, MD  cetirizine (ZYRTEC) 10 MG tablet Take 1 tablet (10 mg total) by mouth daily. Patient not taking: Reported on 12/29/2018 06/13/18   Zigmund Gottron, NP  fluticasone (FLONASE) 50 MCG/ACT nasal spray Place 2 sprays into both nostrils daily. Patient not taking: Reported on 12/29/2018 05/07/18   Melynda Ripple, MD  ipratropium-albuterol (DUONEB) 0.5-2.5 (3) MG/3ML SOLN Take 3 mLs by nebulization every 4 (four) hours as needed. Patient not taking: Reported on 01/08/2019 12/14/18   Hall-Potvin, [place], PA-C  Prenatal Vit-Fe Fumarate-FA (MULTIVITAMIN-PRENATAL) 27-0.8 MG TABS tablet Take 1 tablet by mouth daily at 12 noon.    [provider]  Spacer/Aero-Holding Chambers (AEROCHAMBER PLUS) inhaler Use as instructed Patient not taking: Reported on 01/08/2019 05/07/18   Melynda Ripple, MD  ursodiol (ACTIGALL) 500 MG tablet Take 1 tablet (500 mg total) by mouth 2 (two) times daily. Patient not taking: Reported on 03/04/2019 02/25/19   Constant, Vickii Chafe, MD    Allergies  Shrimp [shellfish allergy] and Tylenol [acetaminophen]  Review of Systems   Review of Systems  Constitutional: Negative for chills and fever.  HENT: Positive for congestion.   Respiratory: Positive for cough and shortness of breath.   Cardiovascular: Negative for chest pain.  Gastrointestinal: Negative for abdominal pain.  Genitourinary: Negative for vaginal bleeding and vaginal discharge.  All other systems reviewed and are negative.   Physical Exam Updated Vital Signs BP 115/70   Pulse (!) 119   Temp (!) 97.5 F (36.4 C) (Oral)   Resp (!) 47   Ht 1.524 m (5')   Wt 61.2 kg   LMP 08/03/2018   SpO2 97%   BMI 26.37 kg/m   Physical Exam Vitals and  nursing note reviewed.  Constitutional:      Appearance: She is well-developed.     Comments: Ill-appearing but nontoxic  HENT:     Head: Normocephalic and atraumatic.  Eyes:     Pupils: Pupils are equal, round, and reactive to light.  Cardiovascular:     Rate and Rhythm: Regular rhythm. Tachycardia present.     Heart sounds: Normal heart sounds.  Pulmonary:     Effort: Pulmonary effort is normal. No respiratory distress.     Breath sounds: Wheezing present.     Comments: Tachypnea noted, increased respiratory rate and effort, fair air movement with occasional expiratory wheeze Abdominal:     General: Bowel sounds are normal.     Palpations: Abdomen is soft.     Comments: Gravid abdomen above the umbilicus  Musculoskeletal:     Cervical back: Neck supple.  Skin:    General: Skin is warm and dry.  Neurological:     Mental Status: She is alert and oriented to person, place, and time.  Psychiatric:        Mood and Affect: Mood normal.     ED Results / Procedures / Treatments   Labs (all labs ordered are listed, but only abnormal results are displayed) Labs Reviewed  CBC WITH DIFFERENTIAL/PLATELET - Abnormal; Notable for the following components:      Result Value   WBC 12.5 (*)    Hemoglobin 10.7 (*)    HCT 33.9 (*)    MCH 25.7 (*)    Neutro Abs 9.8 (*)    All other components within normal limits  BASIC METABOLIC PANEL - Abnormal; Notable for the following components:   Potassium 3.2 (*)    CO2 19 (*)    Glucose, Bld 125 (*)    Calcium 8.6 (*)    All other components within normal limits  RESPIRATORY PANEL BY RT PCR (FLU A&B, COVID)  POC SARS CORONAVIRUS 2 AG -  ED    EKG EKG Interpretation  Date/Time:  Thursday March 07 2019 02:58:18 EST Ventricular Rate:  136 PR Interval:  142 QRS Duration: 72 QT Interval:  272 QTC Calculation: 409 R Axis:   77 Text Interpretation: Sinus tachycardia Nonspecific ST and T wave abnormality Abnormal ECG Confirmed by  Thayer Jew [phone]) on 03/07/2019 3:21:40 AM   Radiology CT Angio Chest PE W and/or Wo Contrast  Result Date: 03/07/2019 CLINICAL DATA:  Shortness of breath for 3 days. History of asthma and third trimester pregnancy. EXAM: CT ANGIOGRAPHY CHEST WITH CONTRAST TECHNIQUE: Multidetector CT imaging of the chest was performed using the standard protocol during bolus administration of intravenous contrast. Multiplanar CT image reconstructions and MIPs were obtained to evaluate the vascular anatomy. CONTRAST:  22m OMNIPAQUE IOHEXOL 350 MG/ML SOLN COMPARISON:  None. FINDINGS: Cardiovascular: Adequate pulmonary artery opacification but significantly limited by motion artifact. No evidence of pulmonary embolism. Normal heart size. Normal aorta. Mediastinum/Nodes: Remote granulomatous calcification at the left hilum and mediastinum. Lungs/Pleura: Streaky atelectasis in the left lower lobe with airway narrowings. Small focus of airspace opacity in the right upper lobe. Asymmetric upper lobe aeration, more lucent on the left. Upper Abdomen: Negative Musculoskeletal: Negative Review of the MIP images confirms the above findings. IMPRESSION: 1. Airway thickening with patchy atelectasis and small focus of right upper lobe airspace disease. Findings correlate with history of asthma; there could be superimposed infectious bronchitis. 2. Respiratory motion limits pulmonary artery evaluation. No evidence of pulmonary embolism. 3. Remote granulomatous disease. Electronically Signed   By: Monte Fantasia M.D.   On: 03/07/2019 06:24   DG Chest Portable 1 View  Result Date: 03/07/2019 CLINICAL DATA:  Shortness of breath and hypoxia. Pregnant patient at [redacted] weeks gestation. EXAM: PORTABLE CHEST 1 VIEW COMPARISON:  Radiograph 06/19/2018 FINDINGS: Low lung volumes. Central bronchial thickening. Streaky right lung base and retrocardiac atelectasis. No pleural fluid. No pneumothorax or pulmonary edema. No acute osseous  abnormalities are seen. IMPRESSION: Low lung volumes with central bronchial thickening and streaky right lung base and retrocardiac atelectasis. Electronically Signed   By: Keith Rake M.D.   On: 03/07/2019 05:17    Procedures Procedures (including critical care time)  CRITICAL CARE Performed by: Merryl Hacker   Total critical care time: 65 minutes  Critical care time was exclusive of separately billable procedures and treating other patients.  Critical care was necessary to treat or prevent imminent or life-threatening deterioration.  Critical care was time spent personally by me on the following activities: development of treatment plan with patient and/or surrogate as well as nursing, discussions with consultants, evaluation of patient's response to treatment, examination of patient, obtaining history from patient or surrogate, ordering and performing treatments and interventions, ordering and review of laboratory studies, ordering and review of radiographic studies, pulse oximetry and re-evaluation of patient's condition.   Medications Ordered in ED Medications  albuterol (VENTOLIN HFA) 108 (90 Base) MCG/ACT inhaler 6 puff (6 puffs Inhalation Given 03/07/19 0320)  predniSONE (DELTASONE) tablet 60 mg (60 mg Oral Given 03/07/19 0320)  magnesium sulfate IVPB 2 g 50 mL (0 g Intravenous Stopped 03/07/19 0340)  albuterol (VENTOLIN HFA) 108 (90 Base) MCG/ACT inhaler 6 puff (6 puffs Inhalation Given 03/07/19 0411)  albuterol (PROVENTIL,VENTOLIN) solution continuous neb (15 mg/hr Nebulization Given 03/07/19 0606)  iohexol (OMNIPAQUE) 350 MG/ML injection 75 mL (75 mLs Intravenous Contrast Given 03/07/19 0610)    ED Course  I have reviewed the triage vital signs and the nursing notes.  Pertinent labs & imaging results that were available during my care of the patient were reviewed by me and considered in my medical decision making (see chart for details).  Clinical Course as of  Mar 07 627  Thu Mar 07, 2019  0456 On recheck, patient much unchanged.  She does not report any improvement with albuterol.  I discussed with her obtaining a chest x-ray to rule out other etiologies.  PCR Covid testing is pending.  On repeat pulmonary exam, she continues to have fair air movement with occasional expiratory wheeze.  Other etiologies should be considered including pneumonia, PE.   [CH]  0524 Repeat respiratory panel Covid testing is pending.  Patient is now on a nonrebreather.  She has fair air movement.  She has wheezing left greater than right but not to  the extent of her hypoxia.  Chest x-ray does not show any pneumonia but does show some peribronchial thickening.  While there is likely at least a component of asthma, given her vital signs and declining respiratory status, will rule out PE.  I discussed with the patient risk and benefits of CT.  She is agreeable to CT.  When Covid testing returns, we will plan to transition to continuous albuterol.   [CH]  5701 Patient has received a total of 18 puffs of albuterol within 2 hours.   [CH]  [id number] Patient's respiratory status continues to deteriorate.  On repeat respiratory exam, she is tachypneic with retractions.  Patient was placed on continuous DuoNeb.  She did go for her CT scan.  Upon return she was noted to have O2 sats in the 80s.  She was placed back on a nonrebreather.  She will be placed on BiPAP for work of breathing and hypoxia.  I reviewed her CT scan and do not see an obvious PE.  Repeat pulmonary exam with more diminished and tight breath sounds on the left.  Right with fair air movement.  Will place on BiPAP with continuous DuoNeb.    [CH]  [phone] Plan for admission to the hospitalist and stepdown unit given hypoxic respiratory failure likely secondary to an asthma exacerbation.  Terbutaline would be an option in her as well.  Patient was monitored by OB while in the emergency department.  Discussed with Dr. Ilda Basset who will  consult on the patient.  Recommends admission to 2N for ability to monitor.   [CH]    Clinical Course User Index [CH] Daiwik Buffalo, Barbette Hair, MD   MDM Rules/Calculators/A&P                       Patient presents with shortness of breath.  Does report recent upper respiratory symptoms including cough and congestion.  Denies any sick contacts.  She is tachypneic, tachycardic and in mild respiratory distress.  Initially, she had fair air movement with only slight wheezing.  She was requiring 4 L of nasal cannula.  This progressed to requiring nasal cannula and ultimately BiPAP.  She became progressively more tight.  Initially she was given inhalers and magnesium until her point-of-care coronavirus testing resulted.  She was subsequently placed on a continuous neb.  She was also given steroids.  Because of deteriorating clinical status, CT imaging was obtained to rule out PE given the degree of hypoxemia but with fair air movement on exam.  No evidence of PE on exam.  Currently she is on a continuous DuoNeb with BiPAP.  OB has been in consultation and monitored in the department.  They will continue to consult.  We will plan for admission to the hospitalist and stepdown unit.  Given respiratory status, patient will be given terbutaline as well.  Final Clinical Impression(s) / ED Diagnoses Final diagnoses:  Acute respiratory failure with hypoxia ([place])  Moderate persistent asthma with exacerbation    Rx / DC Orders ED Discharge Orders    None       Tamilyn Lupien, Barbette Hair, MD 03/07/19 0630

## 2019-03-07 NOTE — Progress Notes (Signed)
ANTICOAGULATION CONSULT NOTE - Follow Up Consult  Pharmacy Consult for Enoxaparin Indication: VTE prophylaxis  Allergies  Allergen Reactions  . Shrimp [Shellfish Allergy] Itching  . Tylenol [Acetaminophen] Rash    Patient Measurements: Height: 5' (152.4 cm) Weight: 135 lb (61.2 kg) IBW/kg (Calculated) : 45.5  Vital Signs: Temp: 97.5 F (36.4 C) (12/31 0301) Temp Source: Oral (12/31 0301) BP: 115/73 (12/31 0700) Pulse Rate: 139 (12/31 0700)  Labs: Recent Labs    03/07/19 0315  HGB 10.7*  HCT 33.9*  PLT 334  CREATININE 0.52    Estimated Creatinine Clearance: 82.6 mL/min (by C-G formula based on SCr of 0.52 mg/dL).   Assessment: 33 y.o. [id number] @ [redacted]w[redacted]d, with the above CC. Pregnancy complicated by: asthma, h/o preterm birth x 2, cholestasis of pregnancy, posterior previa, language barrier.  Patient to ED with SOB and also having some URI s/s.  Pharmacy consulted for enoxaparin dosing for VTE Prophylaxis.  Goal of Therapy:  Monitor platelets by anticoagulation protocol: Yes   Plan:  Lovenox 40 mg subq q 24hr Monitor platelets and Hgb   Alanda Slim, PharmD, [facility] Clinical Pharmacist Please see AMION for all Pharmacists' Contact Phone Numbers 03/07/2019, 7:38 AM

## 2019-03-07 NOTE — Consult Note (Addendum)
Medical Consultation   Dawn Haney  SWF:[id number]  DOB: 05-30-1985  DOA: 03/07/2019  PCP: Patient, No Pcp Per   Requesting physician: Lightstreet Bing, MD  Reason for consultation: Shortness of breath   History of Present Illness: Dawn Haney is an 33 y.o. female with past medical history significant for asthma, pregnancy at 30 weeks and 6 days, posterior previa, and cholestasis of pregnancy.  She presents with complaints of progressively worsening shortness of breath.  Patient complains of associated symptoms of a productive cough and runny nose.  Associated symptoms include wheezing, palpitations, and malaise.  Denies having any fever, chills, nausea, vomiting, diarrhea, or abdominal pain.  She has been using her butyryl inhaler without relief at home.  In the ED patient O2 saturations were noted to be as low as 85% for which she was placed on a nonrebreather.  CT angiogram of the chest revealed bronchial thickening with small area of right upper lobe disease concerning for possible infectious bronchitis, but did not show any signs of a pulmonary embolus.  Patient was given IV mag 2 g, 60 mg of prednisone p.o., multiple parts of the albuterol inhaler, and albuterol continuous nebs after COVID-19/influenza screening was negative.  Due to her continued respiratory distress she was thereafter placed on BiPAP.   Review of Systems  Unable to perform ROS: Severe respiratory distress  Constitutional: Positive for malaise/fatigue. Negative for chills and fever.  HENT: Negative for ear discharge and nosebleeds.   Eyes: Negative for photophobia and pain.  Respiratory: Positive for cough, sputum production, shortness of breath and wheezing.   Cardiovascular: Positive for palpitations. Negative for chest pain and leg swelling.  Gastrointestinal: Negative for abdominal pain, nausea and vomiting.  Genitourinary: Negative for frequency.  Musculoskeletal: Negative for  falls.  Skin: Negative for rash.  Neurological: Negative for focal weakness and loss of consciousness.  Endo/Heme/Allergies: Negative for environmental allergies.  Psychiatric/Behavioral: Negative for memory loss and substance abuse. The patient has insomnia.   As per HPI otherwise 10 point review of systems negative.     Past Medical History: Past Medical History:  Diagnosis Date  . Asthma     Past Surgical History: Past Surgical History:  Procedure Laterality Date  . NO PAST SURGERIES       Allergies:   Allergies  Allergen Reactions  . Shrimp [Shellfish Allergy] Itching  . Tylenol [Acetaminophen] Rash     Social History:  reports that she has never smoked. She has never used smokeless tobacco. She reports that she does not drink alcohol or use drugs.   Family History: Unknown due to respiratory distress   Physical Exam: Vitals:   03/07/19 0625 03/07/19 0630 03/07/19 0646 03/07/19 0700  BP:  102/66 100/72 115/73  Pulse:  (!) 128 (!) 129 (!) 139  Resp:  (!) 32 (!) 35 (!) 32  Temp:      TempSrc:      SpO2: 97% 98% 97% 98%  Weight:      Height:        Constitutional: Young female who is alert and awake, oriented x3, and appears to be in some respiratory distress. Eyes: PERLA, EOMI, irises appear normal, anicteric sclera,  ENMT: external ears and nose appear normal, lips appear normal in color. Neck: neck appears normal, no masses, normal ROM, no thyromegaly, no JVD  CVS: Tachycardic without significant murmur appreciated, no LE edema, normal pedal pulses   Respiratory:  Tachypneic with decreased air movement and positive expiratory wheezes appreciated in both lung fields.  Patient currently on continuous albuterol nebulizer treatment.   Abdomen: Protuberant abdomen, normal bowel sounds, no hepatosplenomegaly. Currently hooked up for fetal monitoring Musculoskeletal: : no cyanosis, clubbing or edema noted bilaterally Neuro: Cranial nerves II-XII intact,  strength, sensation, reflexes Psych: judgement and insight appear normal, stable mood and affect, mental status Skin: no rashes or lesions or ulcers, no induration or nodules   Data reviewed:  I have personally reviewed following labs and imaging studies Labs:  CBC: Recent Labs  Lab 03/07/19 0315  WBC 12.5*  NEUTROABS 9.8*  HGB 10.7*  HCT 33.9*  MCV 81.5  PLT 334    Basic Metabolic Panel: Recent Labs  Lab 03/07/19 0315  NA 137  K 3.2*  CL 107  CO2 19*  GLUCOSE 125*  BUN 6  CREATININE 0.52  CALCIUM 8.6*   GFR Estimated Creatinine Clearance: 82.6 mL/min (by C-G formula based on SCr of 0.52 mg/dL). Liver Function Tests: No results for input(s): AST, ALT, ALKPHOS, BILITOT, PROT, ALBUMIN in the last 168 hours. No results for input(s): LIPASE, AMYLASE in the last 168 hours. No results for input(s): AMMONIA in the last 168 hours. Coagulation profile No results for input(s): INR, PROTIME in the last 168 hours.  Cardiac Enzymes: No results for input(s): CKTOTAL, CKMB, CKMBINDEX, TROPONINI in the last 168 hours. BNP: Invalid input(s): POCBNP CBG: No results for input(s): GLUCAP in the last 168 hours. D-Dimer No results for input(s): DDIMER in the last 72 hours. Hgb A1c No results for input(s): HGBA1C in the last 72 hours. Lipid Profile No results for input(s): CHOL, HDL, LDLCALC, TRIG, CHOLHDL, LDLDIRECT in the last 72 hours. Thyroid function studies No results for input(s): TSH, T4TOTAL, T3FREE, THYROIDAB in the last 72 hours.  Invalid input(s): FREET3 Anemia work up No results for input(s): VITAMINB12, FOLATE, FERRITIN, TIBC, IRON, RETICCTPCT in the last 72 hours. Urinalysis    Component Value Date/Time   COLORURINE YELLOW 12/24/2010 1610   APPEARANCEUR CLEAR 12/24/2010 1610   LABSPEC 1.010 12/24/2010 1610   PHURINE 7.0 12/24/2010 1610   GLUCOSEU NEGATIVE 12/24/2010 1610   HGBUR LARGE (A) 12/24/2010 1610   BILIRUBINUR NEGATIVE 12/24/2010 1610   KETONESUR  NEGATIVE 12/24/2010 1610   PROTEINUR NEGATIVE 12/24/2010 1610   UROBILINOGEN 0.2 12/24/2010 1610   NITRITE NEGATIVE 12/24/2010 1610   LEUKOCYTESUR NEGATIVE 12/24/2010 1610     Microbiology Recent Results (from the past 240 hour(s))  Respiratory Panel by RT PCR (Flu A&B, Covid) - Nasopharyngeal Swab     Status: None   Collection Time: 03/07/19  4:33 AM   Specimen: Nasopharyngeal Swab  Result Value Ref Range Status   SARS Coronavirus 2 by RT PCR NEGATIVE NEGATIVE Final    Comment: (NOTE) SARS-CoV-2 target nucleic acids are NOT DETECTED. The SARS-CoV-2 RNA is generally detectable in upper respiratoy specimens during the acute phase of infection. The lowest concentration of SARS-CoV-2 viral copies this assay can detect is 131 copies/mL. A negative result does not preclude SARS-Cov-2 infection and should not be used as the sole basis for treatment or other patient management decisions. A negative result may occur with  improper specimen collection/handling, submission of specimen other than nasopharyngeal swab, presence of viral mutation(s) within the areas targeted by this assay, and inadequate number of viral copies (<131 copies/mL). A negative result must be combined with clinical observations, patient history, and epidemiological information. The expected result is Negative. Fact Sheet for Patients:  PinkCheek.be Fact Sheet for Healthcare Providers:  GravelBags.it This test is not yet ap proved or cleared by the [place] FDA and  has been authorized for detection and/or diagnosis of SARS-CoV-2 by FDA under an Emergency Use Authorization (EUA). This EUA will remain  in effect (meaning this test can be used) for the duration of the COVID-19 declaration under Section 564(b)(1) of the Act, 21 U.S.C. section 360bbb-3(b)(1), unless the authorization is terminated or revoked sooner.    Influenza A by PCR NEGATIVE NEGATIVE  Final   Influenza B by PCR NEGATIVE NEGATIVE Final    Comment: (NOTE) The Xpert Xpress SARS-CoV-2/FLU/RSV assay is intended as an aid in  the diagnosis of influenza from Nasopharyngeal swab specimens and  should not be used as a sole basis for treatment. Nasal washings and  aspirates are unacceptable for Xpert Xpress SARS-CoV-2/FLU/RSV  testing. Fact Sheet for Patients: PinkCheek.be Fact Sheet for Healthcare Providers: GravelBags.it This test is not yet approved or cleared by the [place] FDA and  has been authorized for detection and/or diagnosis of SARS-CoV-2 by  FDA under an Emergency Use Authorization (EUA). This EUA will remain  in effect (meaning this test can be used) for the duration of the  Covid-19 declaration under Section 564(b)(1) of the Act, 21  U.S.C. section 360bbb-3(b)(1), unless the authorization is  terminated or revoked. Performed at [facility], [place] [address], [place], [place] [address]        Inpatient Medications:   Scheduled Meds: . methylPREDNISolone (SOLU-MEDROL) injection  60 mg Intravenous Q8H   Continuous Infusions:   Radiological Exams on Admission: CT Angio Chest PE W and/or Wo Contrast  Result Date: 03/07/2019 CLINICAL DATA:  Shortness of breath for 3 days. History of asthma and third trimester pregnancy. EXAM: CT ANGIOGRAPHY CHEST WITH CONTRAST TECHNIQUE: Multidetector CT imaging of the chest was performed using the standard protocol during bolus administration of intravenous contrast. Multiplanar CT image reconstructions and MIPs were obtained to evaluate the vascular anatomy. CONTRAST:  62mL OMNIPAQUE IOHEXOL 350 MG/ML SOLN COMPARISON:  None. FINDINGS: Cardiovascular: Adequate pulmonary artery opacification but significantly limited by motion artifact. No evidence of pulmonary embolism. Normal heart size. Normal aorta. Mediastinum/Nodes: Remote granulomatous calcification at  the left hilum and mediastinum. Lungs/Pleura: Streaky atelectasis in the left lower lobe with airway narrowings. Small focus of airspace opacity in the right upper lobe. Asymmetric upper lobe aeration, more lucent on the left. Upper Abdomen: Negative Musculoskeletal: Negative Review of the MIP images confirms the above findings. IMPRESSION: 1. Airway thickening with patchy atelectasis and small focus of right upper lobe airspace disease. Findings correlate with history of asthma; there could be superimposed infectious bronchitis. 2. Respiratory motion limits pulmonary artery evaluation. No evidence of pulmonary embolism. 3. Remote granulomatous disease. Electronically Signed   By: Monte Fantasia M.D.   On: 03/07/2019 06:24   DG Chest Portable 1 View  Result Date: 03/07/2019 CLINICAL DATA:  Shortness of breath and hypoxia. Pregnant patient at [redacted] weeks gestation. EXAM: PORTABLE CHEST 1 VIEW COMPARISON:  Radiograph 06/19/2018 FINDINGS: Low lung volumes. Central bronchial thickening. Streaky right lung base and retrocardiac atelectasis. No pleural fluid. No pneumothorax or pulmonary edema. No acute osseous abnormalities are seen. IMPRESSION: Low lung volumes with central bronchial thickening and streaky right lung base and retrocardiac atelectasis. Electronically Signed   By: Keith Rake M.D.   On: 03/07/2019 05:17    Impression/Recommendations Acute respiratory failure with asthma exacerbation: Acute.  Patient presents with progressively worsening shortness  of breath over the last 2 days with reports of productive cough and upper URI symptoms.  CT angiogram noting bronchial thickening with right upper lobe air space disease concerning for possible infectious bronchitis. -Continuous pulse oximetry with goal O2 saturations greater than 95% for mother and baby -BiPAP as needed -Albuterol nebs 4 times daily  -Empiric antibiotics of Rocephin IV -Solu-Medrol 60 mg IV daily 8 hours -Peak flow monitoring    -Respiratory therapist consult -Mucinex when taking p.o.  SIRS: Patient was found to be tachycardic and tachypneic with elevated WBC of 12.5.  Suspect this is secondary to acute asthma exacerbation, but question the possibility of underlying infection. -Antibiotics as seen above  Pregnancy with complications of placenta previa and cholestasis of pregnancy: Patient is currently 30 weeks and 6 days.  Patient was placed on continuous fetal monitoring. -Per Ob/gyn  Hypokalemia: Acute initial potassium noted to be 3.2 on admission. -Give of potassium chloride IV -Continue to monitor and replace as needed  Hypochromic anemia: Acute on chronic.  Hemoglobin 10.7 which appears to have slowly been trending down over the last 3 months. -Continue to monitor Thank you for this consultation.  Our [facility] hospitalist team will follow the patient with you.   Time Spent: 25 minutes  Clydie Braun M.D. [facility] 03/07/2019, 7:15 AM

## 2019-03-07 NOTE — Progress Notes (Signed)
0715 Assumed OB care of this 33 yo G3P2 @ 30.[redacted] wks GA in with SOB, respiratory distress.  Radiology studies complete. Continuous neb in process. Pt on bi pap. [facility] in to see patient at this time. Plans to admit patient. Patient needs progressive care, but may be admitted to ICU due to need for continuous fetal monitoring. Maternal and fetal tachycardia noted. Maternal increased work of breathing, retractions noted. FHR 165's with moderate variablility, accels, and occasional short varibles noted. Occasional mild UC's tracing, however patient does not feel them.

## 2019-03-07 NOTE — ED Triage Notes (Signed)
The pt id [redacted] weeks pregnant amd has asyhma  Sob for 2-3 days breathing treatments not helping at her home  lmp may 29  edc march 5th no pain anywhere low 02 sats in triage

## 2019-03-07 NOTE — Progress Notes (Signed)
Pt order for BiPAP is PRN. Pt not in need of at this time. Pt BS are rhonchi/diminished and tight. Pt states she is feeling much better. Pt satting 97% on HFNC [place] 10Lpm. RT will continue to monitor.

## 2019-03-07 NOTE — Progress Notes (Addendum)
CAT on hold. Patient is currently in CT at this time.

## 2019-03-08 ENCOUNTER — Encounter (HOSPITAL_COMMUNITY): Payer: Self-pay | Admitting: Obstetrics and Gynecology

## 2019-03-08 DIAGNOSIS — J209 Acute bronchitis, unspecified: Secondary | ICD-10-CM

## 2019-03-08 DIAGNOSIS — J45909 Unspecified asthma, uncomplicated: Secondary | ICD-10-CM

## 2019-03-08 DIAGNOSIS — J9601 Acute respiratory failure with hypoxia: Secondary | ICD-10-CM

## 2019-03-08 HISTORY — DX: Unspecified asthma, uncomplicated: J20.9

## 2019-03-08 LAB — CBC WITH DIFFERENTIAL/PLATELET
Abs Immature Granulocytes: 0.17 10*3/uL — ABNORMAL HIGH (ref 0.00–0.07)
Basophils Absolute: 0 10*3/uL (ref 0.0–0.1)
Basophils Relative: 0 %
Eosinophils Absolute: 0 10*3/uL (ref 0.0–0.5)
Eosinophils Relative: 0 %
HCT: 31.3 % — ABNORMAL LOW (ref 36.0–46.0)
Hemoglobin: 9.9 g/dL — ABNORMAL LOW (ref 12.0–15.0)
Immature Granulocytes: 1 %
Lymphocytes Relative: 6 %
Lymphs Abs: 0.9 10*3/uL (ref 0.7–4.0)
MCH: 25.4 pg — ABNORMAL LOW (ref 26.0–34.0)
MCHC: 31.6 g/dL (ref 30.0–36.0)
MCV: 80.5 fL (ref 80.0–100.0)
Monocytes Absolute: 0.4 10*3/uL (ref 0.1–1.0)
Monocytes Relative: 3 %
Neutro Abs: 13.1 10*3/uL — ABNORMAL HIGH (ref 1.7–7.7)
Neutrophils Relative %: 90 %
Platelets: 360 10*3/uL (ref 150–400)
RBC: 3.89 MIL/uL (ref 3.87–5.11)
RDW: 14.6 % (ref 11.5–15.5)
WBC: 14.7 10*3/uL — ABNORMAL HIGH (ref 4.0–10.5)
nRBC: 0 % (ref 0.0–0.2)

## 2019-03-08 LAB — BASIC METABOLIC PANEL
Anion gap: 10 (ref 5–15)
BUN: 8 mg/dL (ref 6–20)
CO2: 21 mmol/L — ABNORMAL LOW (ref 22–32)
Calcium: 9.1 mg/dL (ref 8.9–10.3)
Chloride: 106 mmol/L (ref 98–111)
Creatinine, Ser: 0.66 mg/dL (ref 0.44–1.00)
GFR calc Af Amer: >60 mL/min (ref >60)
GFR calc non Af Amer: >60 mL/min (ref >60)
Glucose, Bld: 156 mg/dL — ABNORMAL HIGH (ref 70–99)
Potassium: 4.4 mmol/L (ref 3.5–5.1)
Sodium: 137 mmol/L (ref 135–145)

## 2019-03-08 MED ORDER — URSODIOL 300 MG PO CAPS
300.0000 mg | ORAL_CAPSULE | Freq: Two times a day (BID) | ORAL | Status: DC
  Administered 2019-03-08 – 2019-03-10 (×5): 300 mg via ORAL
  Filled 2019-03-08 (×7): qty 1

## 2019-03-08 MED ORDER — URSODIOL 500 MG PO TABS: 500.0000 mg | ORAL_TABLET | Freq: Two times a day (BID) | ORAL | Status: DC

## 2019-03-08 NOTE — Progress Notes (Signed)
Could not connect to Epic for NST due to Internet issue. Running paper strip-will put in medical record. Pt. Reports adequate fetal movement and denies LOF/vaginal bleeding. Denies ctx.

## 2019-03-08 NOTE — Progress Notes (Signed)
RROB at bedside for EFM.  FHR baseline 140, moderate variability, accelerations present, no decelerations.  Patient reports no LOF, vaginal bleeding, contractions.  Reports positive fetal movement.

## 2019-03-08 NOTE — Progress Notes (Signed)
Patient ID: Dawn Haney, female   DOB: 12-25-1985, 34 y.o.   MRN: [id number] [place]) NOTE  Dawn Haney is a 34 y.o. [id number] at [redacted]w[redacted]d by best clinical estimate who is admitted for acute asthma exacerbation.   Fetal presentation is cephalic. Length of Stay:  1  Days  Subjective: Feels better Patient reports the fetal movement as active. Patient reports uterine contraction  activity as none. Patient reports  vaginal bleeding as none. Patient describes fluid per vagina as None.  Vitals:  Blood pressure (!) 86/60, pulse 88, temperature 98.3 F (36.8 C), temperature source Oral, resp. rate (!) 22, height 5' (1.524 m), weight 62.6 kg, last menstrual period 08/03/2018, SpO2 96 %. Physical Examination:  General appearance - alert, well appearing, and in no distress, [place] O2 Heart - normal rate and regular rhythm Abdomen - soft, nontender, nondistended Fundal Height:  size equals dates Cervical Exam: Not evaluated.  Extremities: extremities normal, atraumatic, no cyanosis or edema and Homans sign is negative, no sign of DVT Membranes:intact  Fetal Monitoring:  Baseline: 150 bpm, Variability: Good {> 6 bpm) and Accelerations: Reactive  Labs:  Results for orders placed or performed during the hospital encounter of 03/07/19 (from the past 24 hour(s))  Glucose, capillary   Collection Time: 03/07/19 11:31 AM  Result Value Ref Range   Glucose-Capillary 137 (H) 70 - 99 mg/dL  MRSA PCR Screening   Collection Time: 03/07/19 11:35 AM   Specimen: Nasal Mucosa; Nasopharyngeal  Result Value Ref Range   MRSA by PCR NEGATIVE NEGATIVE  CBC with Differential/Platelet   Collection Time: 03/08/19  3:10 AM  Result Value Ref Range   WBC 14.7 (H) 4.0 - 10.5 K/uL   RBC 3.89 3.87 - 5.11 MIL/uL   Hemoglobin 9.9 (L) 12.0 - 15.0 g/dL   HCT 31.3 (L) 36.0 - 46.0 %   MCV 80.5 80.0 - 100.0 fL   MCH 25.4 (L) 26.0 - 34.0 pg   MCHC 31.6 30.0 - 36.0 g/dL   RDW 14.6  11.5 - 15.5 %   Platelets 360 150 - 400 K/uL   nRBC 0.0 0.0 - 0.2 %   Neutrophils Relative % 90 %   Neutro Abs 13.1 (H) 1.7 - 7.7 K/uL   Lymphocytes Relative 6 %   Lymphs Abs 0.9 0.7 - 4.0 K/uL   Monocytes Relative 3 %   Monocytes Absolute 0.4 0.1 - 1.0 K/uL   Eosinophils Relative 0 %   Eosinophils Absolute 0.0 0.0 - 0.5 K/uL   Basophils Relative 0 %   Basophils Absolute 0.0 0.0 - 0.1 K/uL   Immature Granulocytes 1 %   Abs Immature Granulocytes 0.17 (H) 0.00 - 0.07 K/uL  Basic metabolic panel   Collection Time: 03/08/19  3:10 AM  Result Value Ref Range   Sodium 137 135 - 145 mmol/L   Potassium 4.4 3.5 - 5.1 mmol/L   Chloride 106 98 - 111 mmol/L   CO2 21 (L) 22 - 32 mmol/L   Glucose, Bld 156 (H) 70 - 99 mg/dL   BUN 8 6 - 20 mg/dL   Creatinine, Ser 0.66 0.44 - 1.00 mg/dL   Calcium 9.1 8.9 - 10.3 mg/dL   GFR calc non Af Amer >60 >60 mL/min   GFR calc Af Amer >60 >60 mL/min   Anion gap 10 5 - 15     Medications:  Scheduled . albuterol  2.5 mg Nebulization QID  . Chlorhexidine Gluconate Cloth  6 each Topical Daily  . enoxaparin (  LOVENOX) injection  40 mg Subcutaneous Q24H  . guaiFENesin  600 mg Oral BID  . loratadine  10 mg Oral Daily  . methylPREDNISolone (SOLU-MEDROL) injection  60 mg Intravenous Q8H   I have reviewed the patient's current medications.  ASSESSMENT: Patient Active Problem List   Diagnosis Date Noted  . Placenta previa 03/07/2019  . Asthma affecting pregnancy in third trimester 03/07/2019  . Acute respiratory failure with hypoxia (HCC) 03/07/2019  . Hypokalemia 03/07/2019  . Language barrier 02/11/2019  . Alpha thalassemia silent carrier 02/07/2019  . Cholestasis of pregnancy, antepartum 01/17/2019  . Previous preterm delivery, antepartum 01/14/2019  . Supervision of high risk pregnancy, antepartum 01/08/2019    PLAN: Doing well obstetrically, improved respiratory status. She might be transferred to PCU if continued improvement. I ordered her  ursodiol   Scheryl Darter 03/08/2019,7:06 AM

## 2019-03-08 NOTE — Progress Notes (Signed)
   Vital Signs MEWS/VS Documentation      03/08/2019 1552 03/08/2019 1600 03/08/2019 1608 03/08/2019 1618   MEWS Score:  3  4  4  4    MEWS Score Color:  Yellow  Red  Red  Red   Resp:  (!) 21  (!) 28  --  --   Pulse:  (!) 102  (!) 109  --  --   BP:  (!) 95/55  --  --  --   Temp:  97.9 F (36.6 C)  --  --  --   O2 Device:  HFNC  HFNC  --  --   O2 Flow Rate (L/min):  7 L/min  --  --  --   Level of Consciousness:  --  --  --  Alert     Not an acute change. This RN manually counted RR of 17. Patient is asymptomatic.  Patient is on 7L HFNC and resting comfortably. Notified Dr . Will continue to monitor patient.       Erum Cercone 03/08/2019,5:03 PM

## 2019-03-08 NOTE — L&D Delivery Note (Addendum)
OB/GYN Faculty Practice Delivery Note  Dawn Haney is a 34 y.o. [id number] at [redacted]w[redacted]d. She was admitted for IOL d/t cholestasis.   ROM: 6h 49m with clear fluid GBS Status: negative Maximum Maternal Temperature: 98.3*F  Labor Progress: Patient presented with CVE 1/thick/ballotable and received cytotec x 2 and a foley balloon. CVE progressed to 4.5/50/-3 and patient was started on pitocin. CVE progressed to 5/70/-2 and patient received AROM and IUPC, continued pitocin. She progressed to complete thereafter.  Delivery Date/Time: 05/01/2019 at 0925 Delivery: Called to room and patient was complete and pushing. Head delivered ROA. No nuchal cord present. Shoulder and body delivered in usual fashion. Infant with spontaneous cry, placed on mother's abdomen, dried and stimulated. Cord clamped x 2 after 1-minute delay, and cut by this provider. Cord blood drawn. Placenta delivered spontaneously with gentle cord traction. Fundus firm with massage and Pitocin. Labia, perineum, vagina, and cervix inspected with no abrasions or lacerations.   Placenta: 3VC, intact, to L&D Complications: none Lacerations: none EBL: 250 mL Analgesia: epidural  Postpartum Planning [ ]  message to sent to schedule follow-up  [ ]  vaccines UTD  Infant: female  APGARs 8,9  weight pending per medical chart  , MD [facility], PGY-1 04/21/2019, 9:40 AM   I was gloved and present for delivery in its entirety.  Second stage of labor progressed, baby delivered after pushing 30 minutes  Complications: none  Lacerations: none  EBL: 250  [facility], CNM 10:02 AM

## 2019-03-08 NOTE — Progress Notes (Signed)
Unable to capture fhr tracing in epic.  Paper strip ran.  NST reactive and reassuring with fhr baseline 145bpm, moderate variability, 15x15 accels and no decels. Pt reports no VB, LOF, or UCs.  Instructed to tell her nurse if she experiences any of these things.  POC is for patient to move to progressive care once a bed is available. Dr Earlene Plater made aware.

## 2019-03-08 NOTE — Progress Notes (Signed)
PROGRESS NOTE- F/U Consultation    Dawn Haney  RKY:[id number] DOB: [birth year] DOA: 03/07/2019 PCP: Patient, No Pcp Per   Attending- Dr. Vergie Living  Brief Narrative:  Dawn Haney is an 34 y.o. [place] female with past medical history significant for asthma, pregnancy at 30 weeks and 6 days, posterior previa, and cholestasis of pregnancy.  She presents with complaints of progressively worsening shortness of breath.  Patient complains of associated symptoms of a productive cough and runny nose.  Associated symptoms include wheezing, palpitations, and malaise.  Denies having any fever, chills, nausea, vomiting, diarrhea, or abdominal pain.  She has been using her butyryl inhaler without relief at home.  Admitted to ICU - BiPAp and steroids/nevs - much better 1/1  Assessment & Plan:   Principal Problem:   Acute respiratory failure with hypoxia (HCC) Active Problems:   Cholestasis of pregnancy, antepartum   Placenta previa   Asthma affecting pregnancy in third trimester   Hypokalemia   Bronchitis with asthma, acute    Acute Resp Failure w/ Hypoxia  - Much better, off BiPap and on 7 L HF [place] O2 - OK for PCU, will transfer - continue steroids, nebs, O2  Bronchitis w/ asthma  - steroids, CTX, nebs - antiH1 - needs PCP f/u at dc vs pulmonary or both and asthma education - appears to have little insight into this problem - needs steroid inhaler daily at dc- start before dc and instruct in use  Hypokalemia  - resolved - check Mg (was given this also)  Cholestasis of pregnancy  - ursodiol  Pregnant w/ Placenta Previa  DVT prophylaxis: Lovenox Code Status: Full Family Communication: husband at bedside Disposition Plan: Home maybe 1/3   Antimicrobials:  - Ceftriazone IV 12/31 start- anticipate transition to po at dc   Subjective: Feeling much better - off Bipap Less cough Some nasal dc   Objective: Vitals:   03/08/19 1100 03/08/19 1123 03/08/19 1200  03/08/19 1220  BP:   (!) 86/64 100/68  Pulse: 99  (!) 110 (!) 109  Resp: (!) 26  (!) 24 (!) 27  Temp: 98.4 F (36.9 C)     TempSrc: Axillary     SpO2: 98% 98% 93% 95%  Weight:      Height:        Intake/Output Summary (Last 24 hours) at 03/08/2019 1319 Last data filed at 03/08/2019 1200 Gross per 24 hour  Intake 450 ml  Output 2160 ml  Net -1710 ml   Filed Weights   03/07/19 0302 03/07/19 1000  Weight: 61.2 kg 62.6 kg    Examination:  General:  NAD Eyes:   anicteric Lungs:  Fair air mvt w/ prolonged exp phase w/ faint diffuse wheezes Heart::  S1S2 no rubs, murmurs or gallops Abdomen:  soft and nontender, BS+ Ext:   no edema, cyanosis or clubbing Neuro:  Intact mental status  Data Reviewed: I have personally reviewed following labs and imaging studies  CBC: Recent Labs  Lab 03/07/19 0315 03/08/19 0310  WBC 12.5* 14.7*  NEUTROABS 9.8* 13.1*  HGB 10.7* 9.9*  HCT 33.9* 31.3*  MCV 81.5 80.5  PLT 334 360   Basic Metabolic Panel: Recent Labs  Lab 03/07/19 0315 03/08/19 0310  NA 137 137  K 3.2* 4.4  CL 107 106  CO2 19* 21*  GLUCOSE 125* 156*  BUN 6 8  CREATININE 0.52 0.66  CALCIUM 8.6* 9.1    CBG: Recent Labs  Lab 03/07/19 1131  GLUCAP 137*  Recent Results (from the past 240 hour(s))  Respiratory Panel by RT PCR (Flu A&B, Covid) - Nasopharyngeal Swab     Status: None   Collection Time: 03/07/19  4:33 AM   Specimen: Nasopharyngeal Swab  Result Value Ref Range Status   SARS Coronavirus 2 by RT PCR NEGATIVE NEGATIVE Final    Comment: (NOTE) SARS-CoV-2 target nucleic acids are NOT DETECTED. The SARS-CoV-2 RNA is generally detectable in upper respiratoy specimens during the acute phase of infection. The lowest concentration of SARS-CoV-2 viral copies this assay can detect is 131 copies/mL. A negative result does not preclude SARS-Cov-2 infection and should not be used as the sole basis for treatment or other patient management decisions. A  negative result may occur with  improper specimen collection/handling, submission of specimen other than nasopharyngeal swab, presence of viral mutation(s) within the areas targeted by this assay, and inadequate number of viral copies (<131 copies/mL). A negative result must be combined with clinical observations, patient history, and epidemiological information. The expected result is Negative. Fact Sheet for Patients:  https://www.moore.com/ Fact Sheet for Healthcare Providers:  https://www.young.biz/ This test is not yet ap proved or cleared by the [place] FDA and  has been authorized for detection and/or diagnosis of SARS-CoV-2 by FDA under an Emergency Use Authorization (EUA). This EUA will remain  in effect (meaning this test can be used) for the duration of the COVID-19 declaration under Section 564(b)(1) of the Act, 21 U.S.C. section 360bbb-3(b)(1), unless the authorization is terminated or revoked sooner.    Influenza A by PCR NEGATIVE NEGATIVE Final   Influenza B by PCR NEGATIVE NEGATIVE Final    Comment: (NOTE) The Xpert Xpress SARS-CoV-2/FLU/RSV assay is intended as an aid in  the diagnosis of influenza from Nasopharyngeal swab specimens and  should not be used as a sole basis for treatment. Nasal washings and  aspirates are unacceptable for Xpert Xpress SARS-CoV-2/FLU/RSV  testing. Fact Sheet for Patients: https://www.moore.com/ Fact Sheet for Healthcare Providers: https://www.young.biz/ This test is not yet approved or cleared by the [place] FDA and  has been authorized for detection and/or diagnosis of SARS-CoV-2 by  FDA under an Emergency Use Authorization (EUA). This EUA will remain  in effect (meaning this test can be used) for the duration of the  Covid-19 declaration under Section 564(b)(1) of the Act, 21  U.S.C. section 360bbb-3(b)(1), unless the authorization is    terminated or revoked. Performed at [facility], [address] [place], [place] [address]   MRSA PCR Screening     Status: None   Collection Time: 03/07/19 11:35 AM   Specimen: Nasal Mucosa; Nasopharyngeal  Result Value Ref Range Status   MRSA by PCR NEGATIVE NEGATIVE Final    Comment:        The GeneXpert MRSA Assay (FDA approved for NASAL specimens only), is one component of a comprehensive MRSA colonization surveillance program. It is not intended to diagnose MRSA infection nor to guide or monitor treatment for MRSA infections. Performed at [facility], [address], [place], [place] [address]          Radiology Studies: CT Angio Chest PE W and/or Wo Contrast  Result Date: 03/07/2019 CLINICAL DATA:  Shortness of breath for 3 days. History of asthma and third trimester pregnancy. EXAM: CT ANGIOGRAPHY CHEST WITH CONTRAST TECHNIQUE: Multidetector CT imaging of the chest was performed using the standard protocol during bolus administration of intravenous contrast. Multiplanar CT image reconstructions and MIPs were obtained  to evaluate the vascular anatomy. CONTRAST:  83mL OMNIPAQUE IOHEXOL 350 MG/ML SOLN COMPARISON:  None. FINDINGS: Cardiovascular: Adequate pulmonary artery opacification but significantly limited by motion artifact. No evidence of pulmonary embolism. Normal heart size. Normal aorta. Mediastinum/Nodes: Remote granulomatous calcification at the left hilum and mediastinum. Lungs/Pleura: Streaky atelectasis in the left lower lobe with airway narrowings. Small focus of airspace opacity in the right upper lobe. Asymmetric upper lobe aeration, more lucent on the left. Upper Abdomen: Negative Musculoskeletal: Negative Review of the MIP images confirms the above findings. IMPRESSION: 1. Airway thickening with patchy atelectasis and small focus of right upper lobe airspace disease. Findings correlate with history of asthma; there could be superimposed  infectious bronchitis. 2. Respiratory motion limits pulmonary artery evaluation. No evidence of pulmonary embolism. 3. Remote granulomatous disease. Electronically Signed   By: Monte Fantasia M.D.   On: 03/07/2019 06:24   DG Chest Portable 1 View  Result Date: 03/07/2019 CLINICAL DATA:  Shortness of breath and hypoxia. Pregnant patient at [redacted] weeks gestation. EXAM: PORTABLE CHEST 1 VIEW COMPARISON:  Radiograph 06/19/2018 FINDINGS: Low lung volumes. Central bronchial thickening. Streaky right lung base and retrocardiac atelectasis. No pleural fluid. No pneumothorax or pulmonary edema. No acute osseous abnormalities are seen. IMPRESSION: Low lung volumes with central bronchial thickening and streaky right lung base and retrocardiac atelectasis. Electronically Signed   By: Keith Rake M.D.   On: 03/07/2019 05:17        Scheduled Meds: . albuterol  2.5 mg Nebulization QID  . Chlorhexidine Gluconate Cloth  6 each Topical Daily  . enoxaparin (LOVENOX) injection  40 mg Subcutaneous Q24H  . guaiFENesin  600 mg Oral BID  . loratadine  10 mg Oral Daily  . methylPREDNISolone (SOLU-MEDROL) injection  60 mg Intravenous Q8H  . ursodiol  300 mg Oral BID   Continuous Infusions: . sodium chloride    . cefTRIAXone (ROCEPHIN)  IV 1 g (03/08/19 1218)     LOS: 1 day        Silvano Rusk, MD [facility] Pager [phone]  If 7PM-7AM, please contact night-coverage [email or web address] Password [email or web address] 03/08/2019, 1:19 PM

## 2019-03-09 DIAGNOSIS — Z3689 Encounter for other specified antenatal screening: Secondary | ICD-10-CM

## 2019-03-09 LAB — CBC
HCT: 29.3 % — ABNORMAL LOW (ref 36.0–46.0)
Hemoglobin: 9.2 g/dL — ABNORMAL LOW (ref 12.0–15.0)
MCH: 25.1 pg — ABNORMAL LOW (ref 26.0–34.0)
MCHC: 31.4 g/dL (ref 30.0–36.0)
MCV: 80.1 fL (ref 80.0–100.0)
Platelets: 349 10*3/uL (ref 150–400)
RBC: 3.66 MIL/uL — ABNORMAL LOW (ref 3.87–5.11)
RDW: 14.4 % (ref 11.5–15.5)
WBC: 14.6 10*3/uL — ABNORMAL HIGH (ref 4.0–10.5)
nRBC: 0 % (ref 0.0–0.2)

## 2019-03-09 LAB — BASIC METABOLIC PANEL
Anion gap: 12 (ref 5–15)
BUN: 9 mg/dL (ref 6–20)
CO2: 22 mmol/L (ref 22–32)
Calcium: 8.8 mg/dL — ABNORMAL LOW (ref 8.9–10.3)
Chloride: 103 mmol/L (ref 98–111)
Creatinine, Ser: 0.51 mg/dL (ref 0.44–1.00)
GFR calc Af Amer: >60 mL/min (ref >60)
GFR calc non Af Amer: >60 mL/min (ref >60)
Glucose, Bld: 138 mg/dL — ABNORMAL HIGH (ref 70–99)
Potassium: 4 mmol/L (ref 3.5–5.1)
Sodium: 137 mmol/L (ref 135–145)

## 2019-03-09 LAB — MAGNESIUM: Magnesium: 1.9 mg/dL (ref 1.7–2.4)

## 2019-03-09 MED ORDER — FLUTICASONE PROPIONATE 50 MCG/ACT NA SUSP
1.0000 | Freq: Every day | NASAL | Status: DC
  Administered 2019-03-09: 1 via NASAL
  Filled 2019-03-09: qty 16

## 2019-03-09 NOTE — Progress Notes (Signed)
PROGRESS NOTE-consult f/up    Dawn Haney  ZOX:[id number] DOB: Aug 31, 1985 DOA: 03/07/2019 PCP: Patient, No Pcp Per   Brief Narrative:  Dawn Haney an 34 y.o.[place] femalewith past medical history significant for asthma, pregnancy at 30 weeks and 6 days, posteriorprevia, and cholestasis of pregnancy. She presents with complaints of progressively worsening shortness of breath. Patient complains of associated symptoms of a productive cough and runny nose. Associated symptoms include wheezing, palpitations, and malaise. Denies having any fever, chills, nausea, vomiting, diarrhea, or abdominal pain. She has been using her butyryl inhaler without relief at home.   Assessment & Plan:   Principal Problem:   Acute respiratory failure with hypoxia (HCC) Active Problems:   Cholestasis of pregnancy, antepartum   Placenta previa   Asthma affecting pregnancy in third trimester   Hypokalemia   Bronchitis with asthma, acute   Acute Resp Failure w/ Hypoxia - Much better, continues off BiPap and weaned to 4 L HF [place] O2, continue to wean - continue steroids but will decrease to  40 q8, c/w nebs, O2 as above -will need pred taper on d/c (predpak or medrol dos pak if cleared by obgyn)  Bronchitis w/ asthma - steroids, CTX, nebs as above - AGREE, needs PCP f/u at dc vs pulmonary or both and asthma education  - appears to have little insight into this problem - consider Advair or pulmocirt on d/c vs defer to PCP  Hypokalemia - resolved - Mg  1.9  Cholestasis of pregnancy - ursodiol  Pregnant w/ Placenta Previa -PER ObGyn  DVT prophylaxis: per primary  Code Status: full Family Communication: per primary  Disposition Plan:   per primary,  Consults called: None Admission status: Inpatient   Consultants:   TRH  Procedures:  CT Angio Chest PE W and/or Wo Contrast  Result Date: 03/07/2019 CLINICAL DATA:  Shortness of breath for 3 days. History of asthma and  third trimester pregnancy. EXAM: CT ANGIOGRAPHY CHEST WITH CONTRAST TECHNIQUE: Multidetector CT imaging of the chest was performed using the standard protocol during bolus administration of intravenous contrast. Multiplanar CT image reconstructions and MIPs were obtained to evaluate the vascular anatomy. CONTRAST:  75mL OMNIPAQUE IOHEXOL 350 MG/ML SOLN COMPARISON:  None. FINDINGS: Cardiovascular: Adequate pulmonary artery opacification but significantly limited by motion artifact. No evidence of pulmonary embolism. Normal heart size. Normal aorta. Mediastinum/Nodes: Remote granulomatous calcification at the left hilum and mediastinum. Lungs/Pleura: Streaky atelectasis in the left lower lobe with airway narrowings. Small focus of airspace opacity in the right upper lobe. Asymmetric upper lobe aeration, more lucent on the left. Upper Abdomen: Negative Musculoskeletal: Negative Review of the MIP images confirms the above findings. IMPRESSION: 1. Airway thickening with patchy atelectasis and small focus of right upper lobe airspace disease. Findings correlate with history of asthma; there could be superimposed infectious bronchitis. 2. Respiratory motion limits pulmonary artery evaluation. No evidence of pulmonary embolism. 3. Remote granulomatous disease. Electronically Signed   By: Marnee Spring M.D.   On: 03/07/2019 06:24   [place] MFM OB Transvaginal  Result Date: 03/04/2019 ----------------------------------------------------------------------  OBSTETRICS REPORT                       (Signed Final 03/04/2019 12:39 pm) ---------------------------------------------------------------------- Patient Info  ID #:       [id number]                          D.O.B.:  August 19, 1985 (33 yrs)  Name:  Dawn Haney              Visit Date: 03/04/2019 09:51 am ---------------------------------------------------------------------- Performed By  Performed By:     Marcellina Millin          Ref. Address:     [address]                                                             [address]                                                             [place]                                                             [address]  Attending:        Noralee Space MD        [facility]  Referred By:      [facility] ---------------------------------------------------------------------- Orders   #  Description                          Code         Ordered By   1  [place] MFM OB TRANSVAGINAL               [phone]      RAVI SHANKAR   2  [place] MFM FETAL BPP WO NON              78469.62     RAVI [facility]      STRESS  ----------------------------------------------------------------------   #  Order #                    Accession #                 Episode #   1  [id number]                  [id number]  [id number]   2  [id number]                  [id number]                  [id number]  ---------------------------------------------------------------------- Indications   Placenta previa specified as without           O44.03   hemorrhage, third trimester   [redacted] weeks gestation of pregnancy                Z3A.30   Cholestasis of pregnancy, third trimester      G38.756E33.2O26.613K83.1   Late to prenatal care, third trimester         O09.33  ---------------------------------------------------------------------- Fetal Evaluation  Num Of Fetuses:         1  Fetal Heart Rate(bpm):  131  Cardiac Activity:       Observed  Presentation:           Cephalic  Placenta:               Posterior  P. Cord Insertion:      Previously Visualized  Amniotic Fluid  AFI FV:      Within normal limits  AFI Sum(cm)     %Tile       Largest Pocket(cm)  14.9            52          5.3  RUQ(cm)       RLQ(cm)       LUQ(cm)        LLQ(cm)  5.3           3             1.6            5  Comment:    No placental previa  identified. ---------------------------------------------------------------------- Biophysical Evaluation  Amniotic F.V:   Within normal limits       F. Tone:        Observed  F. Movement:    Observed                   Score:          8/8  F. Breathing:   Observed ---------------------------------------------------------------------- OB History  Gravidity:    3         Term:   2        Prem:   0        SAB:   0  TOP:          0       Ectopic:  0        Living: 2 ---------------------------------------------------------------------- Gestational Age  LMP:           30w 3d        Date:  08/03/18                 EDD:   05/10/19  Best:          Georgiann Hahn30w 3d     Det. By:  LMP  (08/03/18)          EDD:   05/10/19 ---------------------------------------------------------------------- Anatomy  Stomach:               Appears normal, left   Bladder:                Appears normal  sided ---------------------------------------------------------------------- Cervix Uterus Adnexa  Cervix  Length:            5.9  cm.  Normal appearance by transvaginal scan  Adnexa  No abnormality visualized. ---------------------------------------------------------------------- Impression  Cholestasis of pregnancy.  Patient return for antenatal testing  and transvaginal evaluation to assess placental location.  She does not give history of vaginal bleeding.  Amniotic fluid is normal and good fetal activity seen.Antenatal  testing is reassuring. BPP 8/8.  On transvaginal ultrasound, there is no evidence of placenta  previa.  We eassured the patient of the findings ---------------------------------------------------------------------- Recommendations  -Continue weekly BPP till delivery. ----------------------------------------------------------------------                  Noralee Space, MD Electronically Signed Final Report   03/04/2019 12:39 pm ----------------------------------------------------------------------  DG Chest  Portable 1 View  Result Date: 03/07/2019 CLINICAL DATA:  Shortness of breath and hypoxia. Pregnant patient at [redacted] weeks gestation. EXAM: PORTABLE CHEST 1 VIEW COMPARISON:  Radiograph 06/19/2018 FINDINGS: Low lung volumes. Central bronchial thickening. Streaky right lung base and retrocardiac atelectasis. No pleural fluid. No pneumothorax or pulmonary edema. No acute osseous abnormalities are seen. IMPRESSION: Low lung volumes with central bronchial thickening and streaky right lung base and retrocardiac atelectasis. Electronically Signed   By: Narda Rutherford M.D.   On: 03/07/2019 05:17   [place] MFM FETAL BPP WO NON STRESS  Result Date: 03/04/2019 ----------------------------------------------------------------------  OBSTETRICS REPORT                       (Signed Final 03/04/2019 12:39 pm) ---------------------------------------------------------------------- Patient Info  ID #:       [id number]                          D.O.B.:  [birth year] (33 yrs)  Name:       Tye Savoy              Visit Date: 03/04/2019 09:51 am ---------------------------------------------------------------------- Performed By  Performed By:     Marcellina Millin          Ref. Address:     [address]                                                             [address]                                                             [place]  [address]  Attending:        Noralee Space MD        [facility]  Referred By:      [facility] ---------------------------------------------------------------------- Orders   #  Description                          Code         Ordered By   1  [place] MFM OB TRANSVAGINAL               [phone]      RAVI SHANKAR   2  [place] MFM FETAL BPP WO NON              76819.01      RAVI [facility]      STRESS  ----------------------------------------------------------------------   #  Order #                    Accession #                 Episode #   1  [id number]                  [id number]                  [id number]   2  [id number]                  [id number]                  [id number]  ---------------------------------------------------------------------- Indications   Placenta previa specified as without           O44.03   hemorrhage, third trimester   [redacted] weeks gestation of pregnancy                Z3A.30   Cholestasis of pregnancy, third trimester      H96.222L79.8   Late to prenatal care, third trimester         O09.33  ---------------------------------------------------------------------- Fetal Evaluation  Num Of Fetuses:         1  Fetal Heart Rate(bpm):  131  Cardiac Activity:       Observed  Presentation:           Cephalic  Placenta:               Posterior  P. Cord Insertion:      Previously Visualized  Amniotic Fluid  AFI FV:      Within normal limits  AFI Sum(cm)     %Tile       Largest Pocket(cm)  14.9            52          5.3  RUQ(cm)       RLQ(cm)       LUQ(cm)        LLQ(cm)  5.3           3             1.6  5  Comment:    No placental previa identified. ---------------------------------------------------------------------- Biophysical Evaluation  Amniotic F.V:   Within normal limits       F. Tone:        Observed  F. Movement:    Observed                   Score:          8/8  F. Breathing:   Observed ---------------------------------------------------------------------- OB History  Gravidity:    3         Term:   2        Prem:   0        SAB:   0  TOP:          0       Ectopic:  0        Living: 2 ---------------------------------------------------------------------- Gestational Age  LMP:           30w 3d        Date:  08/03/18                 EDD:   05/10/19  Best:          Weston Settle 3d     Det. By:  LMP  (08/03/18)          EDD:   05/10/19  ---------------------------------------------------------------------- Anatomy  Stomach:               Appears normal, left   Bladder:                Appears normal                         sided ---------------------------------------------------------------------- Cervix Uterus Adnexa  Cervix  Length:            5.9  cm.  Normal appearance by transvaginal scan  Adnexa  No abnormality visualized. ---------------------------------------------------------------------- Impression  Cholestasis of pregnancy.  Patient return for antenatal testing  and transvaginal evaluation to assess placental location.  She does not give history of vaginal bleeding.  Amniotic fluid is normal and good fetal activity seen.Antenatal  testing is reassuring. BPP 8/8.  On transvaginal ultrasound, there is no evidence of placenta  previa.  We eassured the patient of the findings ---------------------------------------------------------------------- Recommendations  -Continue weekly BPP till delivery. ----------------------------------------------------------------------                  Tama High, MD Electronically Signed Final Report   03/04/2019 12:39 pm ----------------------------------------------------------------------  [place] MFM OB DETAIL +14 WK  Result Date: 02/21/2019 ----------------------------------------------------------------------  OBSTETRICS REPORT                       (Signed Final 02/21/2019 04:50 pm) ---------------------------------------------------------------------- Patient Info  ID #:       [id number]                          D.O.B.:  1985-08-20 (33 yrs)  Name:       Norman Clay              Visit Date: 02/21/2019 04:19 pm ---------------------------------------------------------------------- Performed By  Performed By:     Rodrigo Ran BS      Ref. Address:     [address]                                                              [address]                                                             [place]                                                             [address]  Attending:        Noralee Space MD        [facility]  Referred By:      [facility] ---------------------------------------------------------------------- Orders   #  Description                          Code         Ordered By   1  [place] MFM OB DETAIL +14 WK              76811.01     KELLY DAVIS  ----------------------------------------------------------------------   #  Order #                    Accession #                 Episode #   1  [id number]                  [id number]                  [id number]  ---------------------------------------------------------------------- Indications   Antenatal screening for malformations          Z36.3   Late to prenatal care, third trimester         O09.33   Cholestasis of pregnancy, third trimester      I69.629B28.4   [redacted] weeks gestation of pregnancy                Z3A.28  ---------------------------------------------------------------------- Fetal Evaluation  Num Of Fetuses:  1  Fetal Heart Rate(bpm):  143  Cardiac Activity:       Observed  Presentation:           Breech  Placenta:               Posterior Previa  P. Cord Insertion:      Visualized  Amniotic Fluid  AFI FV:      Within normal limits  AFI Sum(cm)     %Tile       Largest Pocket(cm)  22.68           93          6.34  RUQ(cm)       RLQ(cm)       LUQ(cm)        LLQ(cm)  5.64          5.46          6.34           5.24 ---------------------------------------------------------------------- Biometry  BPD:      73.1  mm     G. Age:  29w 2d         53  %    CI:        71.14   %    70 - 86                                                          FL/HC:      18.5   %    19.6 - 20.8  HC:      276.1  mm     G. Age:  30w 1d         58  %    HC/AC:      1.08         0.99 - 1.21  AC:       255   mm     G. Age:  29w 5d         69  %    FL/BPD:     69.9   %    71 - 87  FL:       51.1  mm     G. Age:  27w 3d          6  %    FL/AC:      20.0   %    20 - 24  HUM:      46.1  mm     G. Age:  27w 1d          9  %  CER:      35.4  mm     G. Age:  30w 3d         79  %  CM:        5.5  mm  Est. FW:    1310  gm    2 lb 14 oz      40  % ---------------------------------------------------------------------- OB History  Gravidity:    3         Term:   2        Prem:   0        SAB:   0  TOP:          0  Ectopic:  0        Living: 2 ---------------------------------------------------------------------- Gestational Age  LMP:           28w 6d        Date:  08/03/18                 EDD:   05/10/19  U/S Today:     29w 1d                                        EDD:   05/08/19  Best:          28w 6d     Det. By:  LMP  (08/03/18)          EDD:   05/10/19 ---------------------------------------------------------------------- Anatomy  Cranium:               Appears normal         LVOT:                   Appears normal  Cavum:                 Appears normal         Aortic Arch:            Appears normal  Ventricles:            Appears normal         Ductal Arch:            Appears normal  Choroid Plexus:        Appears normal         Diaphragm:              Appears normal  Cerebellum:            Appears normal         Stomach:                Appears normal, left                                                                        sided  Posterior Fossa:       Appears normal         Abdomen:                Appears normal  Nuchal Fold:           Appears normal         Abdominal Wall:         Appears nml (cord                                                                        insert, abd wall)  Face:                  Orbits nl; profile not Cord Vessels:  Appears normal (3                         well visualized                                vessel cord)  Lips:                   Appears normal         Kidneys:                Appear normal  Palate:                Not well visualized    Bladder:                Appears normal  Thoracic:              Appears normal         Spine:                  Appears normal  Heart:                 Appears normal         Upper Extremities:      Appears normal                         (4CH, axis, and                         situs)  RVOT:                  Appears normal         Lower Extremities:      Appears normal  Other:  Heels/feet and open hands/5th digits visualized. Nasal bone          visualized. ---------------------------------------------------------------------- Cervix Uterus Adnexa  Cervix  Length:            3.7  cm.  Normal appearance by transabdominal scan.  Uterus  No abnormality visualized.  Left Ovary  Within normal limits.  Right Ovary  Within normal limits.  Cul De Sac  No free fluid seen.  Adnexa  No abnormality visualized. ---------------------------------------------------------------------- Impression  G3 P2.  Patient has a diagnosis of cholestasis of pregnancy.  She reports her symptoms of itching have improved.  She is  not taking ursodiol.  Liver enzymes have been within normal  range.  Obstetric history significant for 2 previous term vaginal  deliveries.  Patient does not have gestational diabetes.  She  reports no chronic medical conditions.  On ultrasound, amniotic fluid is normal good fetal activity is  seen.  Fetal growth is appropriate for gestational age.  Fetal  anatomical survey appears normal, but limited by advanced  gestational age.  Incidentally observed antenatal testing is  reassuring (BPP 8/8).  On transabdominal scan placenta previa is seen.  Patient  does not have history of vaginal bleeding. ---------------------------------------------------------------------- Recommendations  -BPP and transvaginal ultrasound for placental assessment  on 03/04/19  -BPP in 3 weeks and then weekly BPP till delivery.  ----------------------------------------------------------------------                  Noralee Space, MD Electronically Signed Final Report   02/21/2019 04:50 pm ----------------------------------------------------------------------    Antimicrobials:   CEFTRIAXONE 12/31  Subjective: Patient is been weaned off BiPAP doing much better, Though does have cough although much less persistent   Objective: Vitals:   03/09/19 0720 03/09/19 0812 03/09/19 0900 03/09/19 1115  BP:  95/61 96/62   Pulse:  (!) 102 89   Resp:  (!) 22 19   Temp:  98 F (36.7 C)    TempSrc:  Oral    SpO2: 97% 97% 98% 98%  Weight:      Height:       No intake or output data in the 24 hours ending 03/09/19 1233 Filed Weights   03/07/19 0302 03/07/19 1000  Weight: 61.2 kg 62.6 kg    Examination:  General exam: Appears calm and comfortable  Respiratory system: Trace wheezing, rhonchi bilaterally, Cardiovascular system: S1 & S2 heard, RRR. No JVD, murmurs, rubs, gallops or clicks. No pedal edema. Gastrointestinal system: Pregnant, distant bowel sounds, Central nervous system: Alert and oriented. No focal neurological deficits. Extremities: MOVES all 4 extremities freely, no focal neurological deficits Skin: No rashes, lesions or ulcers Psychiatry: Judgement and insight appear normal. Mood & affect appropriate.     Data Reviewed: I have personally reviewed following labs and imaging studies  CBC: Recent Labs  Lab 03/07/19 0315 03/08/19 0310 03/09/19 0203  WBC 12.5* 14.7* 14.6*  NEUTROABS 9.8* 13.1*  --   HGB 10.7* 9.9* 9.2*  HCT 33.9* 31.3* 29.3*  MCV 81.5 80.5 80.1  PLT 334 360 349   Basic Metabolic Panel: Recent Labs  Lab 03/07/19 0315 03/08/19 0310 03/09/19 0203  NA 137 137 137  K 3.2* 4.4 4.0  CL 107 106 103  CO2 19* 21* 22  GLUCOSE 125* 156* 138*  BUN 6 8 9   CREATININE 0.52 0.66 0.51  CALCIUM 8.6* 9.1 8.8*  MG  --   --  1.9   GFR: Estimated Creatinine Clearance: 83.4 mL/min  (by C-G formula based on SCr of 0.51 mg/dL). Liver Function Tests: No results for input(s): AST, ALT, ALKPHOS, BILITOT, PROT, ALBUMIN in the last 168 hours. No results for input(s): LIPASE, AMYLASE in the last 168 hours. No results for input(s): AMMONIA in the last 168 hours. Coagulation Profile: No results for input(s): INR, PROTIME in the last 168 hours. Cardiac Enzymes: No results for input(s): CKTOTAL, CKMB, CKMBINDEX, TROPONINI in the last 168 hours. BNP (last 3 results) No results for input(s): PROBNP in the last 8760 hours. HbA1C: No results for input(s): HGBA1C in the last 72 hours. CBG: Recent Labs  Lab 03/07/19 1131  GLUCAP 137*   Lipid Profile: No results for input(s): CHOL, HDL, LDLCALC, TRIG, CHOLHDL, LDLDIRECT in the last 72 hours. Thyroid Function Tests: No results for input(s): TSH, T4TOTAL, FREET4, T3FREE, THYROIDAB in the last 72 hours. Anemia Panel: No results for input(s): VITAMINB12, FOLATE, FERRITIN, TIBC, IRON, RETICCTPCT in the last 72 hours. Sepsis Labs: No results for input(s): PROCALCITON, LATICACIDVEN in the last 168 hours.  Recent Results (from the past 240 hour(s))  Respiratory Panel by RT PCR (Flu A&B, Covid) - Nasopharyngeal Swab     Status: None   Collection Time: 03/07/19  4:33 AM   Specimen: Nasopharyngeal Swab  Result Value Ref Range Status   SARS Coronavirus 2 by RT PCR NEGATIVE NEGATIVE Final    Comment: (NOTE) SARS-CoV-2 target nucleic acids are NOT DETECTED. The SARS-CoV-2 RNA is generally detectable in upper respiratoy specimens during the acute phase of infection. The lowest concentration of SARS-CoV-2 viral copies this assay can detect is 131 copies/mL. A negative result  does not preclude SARS-Cov-2 infection and should not be used as the sole basis for treatment or other patient management decisions. A negative result may occur with  improper specimen collection/handling, submission of specimen other than nasopharyngeal swab,  presence of viral mutation(s) within the areas targeted by this assay, and inadequate number of viral copies (<131 copies/mL). A negative result must be combined with clinical observations, patient history, and epidemiological information. The expected result is Negative. Fact Sheet for Patients:  https://www.moore.com/ Fact Sheet for Healthcare Providers:  https://www.young.biz/ This test is not yet ap proved or cleared by the [place] FDA and  has been authorized for detection and/or diagnosis of SARS-CoV-2 by FDA under an Emergency Use Authorization (EUA). This EUA will remain  in effect (meaning this test can be used) for the duration of the COVID-19 declaration under Section 564(b)(1) of the Act, 21 U.S.C. section 360bbb-3(b)(1), unless the authorization is terminated or revoked sooner.    Influenza A by PCR NEGATIVE NEGATIVE Final   Influenza B by PCR NEGATIVE NEGATIVE Final    Comment: (NOTE) The Xpert Xpress SARS-CoV-2/FLU/RSV assay is intended as an aid in  the diagnosis of influenza from Nasopharyngeal swab specimens and  should not be used as a sole basis for treatment. Nasal washings and  aspirates are unacceptable for Xpert Xpress SARS-CoV-2/FLU/RSV  testing. Fact Sheet for Patients: https://www.moore.com/ Fact Sheet for Healthcare Providers: https://www.young.biz/ This test is not yet approved or cleared by the [place] FDA and  has been authorized for detection and/or diagnosis of SARS-CoV-2 by  FDA under an Emergency Use Authorization (EUA). This EUA will remain  in effect (meaning this test can be used) for the duration of the  Covid-19 declaration under Section 564(b)(1) of the Act, 21  U.S.C. section 360bbb-3(b)(1), unless the authorization is  terminated or revoked. Performed at [facility], [address], [place], [place] [address]   MRSA PCR Screening     Status:  None   Collection Time: 03/07/19 11:35 AM   Specimen: Nasal Mucosa; Nasopharyngeal  Result Value Ref Range Status   MRSA by PCR NEGATIVE NEGATIVE Final    Comment:        The GeneXpert MRSA Assay (FDA approved for NASAL specimens only), is one component of a comprehensive MRSA colonization surveillance program. It is not intended to diagnose MRSA infection nor to guide or monitor treatment for MRSA infections. Performed at [facility], [address], [place], [place] [address]          Radiology Studies: No results found.      Scheduled Meds: . albuterol  2.5 mg Nebulization QID  . Chlorhexidine Gluconate Cloth  6 each Topical Daily  . enoxaparin (LOVENOX) injection  40 mg Subcutaneous Q24H  . guaiFENesin  600 mg Oral BID  . loratadine  10 mg Oral Daily  . methylPREDNISolone (SOLU-MEDROL) injection  60 mg Intravenous Q8H  . ursodiol  300 mg Oral BID   Continuous Infusions: . sodium chloride    . cefTRIAXone (ROCEPHIN)  IV 1 g (03/08/19 1218)     LOS: 2 days    Time spent: 35 min    Burke Keels, MD [facility]  If 7PM-7AM, please contact night-coverage  03/09/2019, 12:33 PM

## 2019-03-09 NOTE — Progress Notes (Signed)
   Vital Signs MEWS/VS Documentation      03/09/2019 0900 03/09/2019 1115 03/09/2019 1200 03/09/2019 1400   MEWS Score:  1  --  3  3   MEWS Score Color:  Green  --  Yellow  Yellow   Resp:  19  --  20  20   Pulse:  89  --  (!) 111  --   BP:  96/62  --  93/61  --   Temp:  --  --  98.3 F (36.8 C)  --   O2 Device:  --  HFNC  --  --   O2 Flow Rate (L/min):  4 L/min  4 L/min  --  --   Level of Consciousness:  --  --  Alert  Alert      Not an acute change. Assessed patient and she is resting in bed comfortably and is asymptomatic. Physician is aware of her low BP and HR without concerns at this time.      Melissa Tomaselli 03/09/2019,2:53 PM

## 2019-03-09 NOTE — Progress Notes (Signed)
FACULTY PRACTICE ANTEPARTUM PROGRESS NOTE  Dawn Haney is a 34 y.o. [id number] at [redacted]w[redacted]d who is admitted for asthma exacerbation.  Estimated Date of Delivery: 05/10/19 Fetal presentation is unsure.  Length of Stay:  2 Days. Admitted 03/07/2019  Subjective:  Patient reports normal fetal movement.  She denies uterine contractions, denies bleeding and leaking of fluid per vagina. Reports whole body hurts when she coughs.  Vitals:  Blood pressure (!) 89/62, pulse 88, temperature 97.6 F (36.4 C), temperature source Oral, resp. rate 17, height 5' (1.524 m), weight 62.6 kg, last menstrual period 08/03/2018, SpO2 97 %. Physical Examination: CONSTITUTIONAL: Well-developed, well-nourished female in no acute distress.  HENT:  Normocephalic, atraumatic, External right and left ear normal. Oropharynx is clear and moist EYES: Conjunctivae and EOM are normal. Pupils are equal, round, and reactive to light. No scleral icterus.  NECK: Normal range of motion, supple, no masses. SKIN: Skin is warm and dry. No rash noted. Not diaphoretic. No erythema. No pallor. NEUROLGIC: Alert and oriented to person, place, and time. Normal reflexes, muscle tone coordination. No cranial nerve deficit noted. PSYCHIATRIC: Normal mood and affect. Normal behavior. Normal judgment and thought content. CARDIOVASCULAR: Normal heart rate noted RESPIRATORY: Effort normal, on breathing treatment on arrival, no problems with respiration noted MUSCULOSKELETAL: Normal range of motion. No edema and no tenderness. ABDOMEN: Soft, nontender, nondistended, gravid. CERVIX: deferred  Fetal monitoring: FHR: 140 bpm, Variability: moderate, Accelerations: Present, Decelerations: Absent  Uterine activity: no contractions per hour  Results for orders placed or performed during the hospital encounter of 03/07/19 (from the past 48 hour(s))  Glucose, capillary     Status: Abnormal   Collection Time: 03/07/19 11:31 AM  Result Value Ref Range   Glucose-Capillary 137 (H) 70 - 99 mg/dL  MRSA PCR Screening     Status: None   Collection Time: 03/07/19 11:35 AM   Specimen: Nasal Mucosa; Nasopharyngeal  Result Value Ref Range   MRSA by PCR NEGATIVE NEGATIVE    Comment:        The GeneXpert MRSA Assay (FDA approved for NASAL specimens only), is one component of a comprehensive MRSA colonization surveillance program. It is not intended to diagnose MRSA infection nor to guide or monitor treatment for MRSA infections. Performed at [facility], [address] [place], [place] [address]   CBC with Differential/Platelet     Status: Abnormal   Collection Time: 03/08/19  3:10 AM  Result Value Ref Range   WBC 14.7 (H) 4.0 - 10.5 K/uL   RBC 3.89 3.87 - 5.11 MIL/uL   Hemoglobin 9.9 (L) 12.0 - 15.0 g/dL   HCT 59.9 (L) 35.7 - 01.7 %   MCV 80.5 80.0 - 100.0 fL   MCH 25.4 (L) 26.0 - 34.0 pg   MCHC 31.6 30.0 - 36.0 g/dL   RDW 79.3 90.3 - 00.9 %   Platelets 360 150 - 400 K/uL   nRBC 0.0 0.0 - 0.2 %   Neutrophils Relative % 90 %   Neutro Abs 13.1 (H) 1.7 - 7.7 K/uL   Lymphocytes Relative 6 %   Lymphs Abs 0.9 0.7 - 4.0 K/uL   Monocytes Relative 3 %   Monocytes Absolute 0.4 0.1 - 1.0 K/uL   Eosinophils Relative 0 %   Eosinophils Absolute 0.0 0.0 - 0.5 K/uL   Basophils Relative 0 %   Basophils Absolute 0.0 0.0 - 0.1 K/uL   Immature Granulocytes 1 %   Abs Immature Granulocytes 0.17 (H) 0.00 - 0.07 K/uL  Comment: Performed at [facility], [place] [address] [place], [place] [address]  Basic metabolic panel     Status: Abnormal   Collection Time: 03/08/19  3:10 AM  Result Value Ref Range   Sodium 137 135 - 145 mmol/L   Potassium 4.4 3.5 - 5.1 mmol/L   Chloride 106 98 - 111 mmol/L   CO2 21 (L) 22 - 32 mmol/L   Glucose, Bld 156 (H) 70 - 99 mg/dL   BUN 8 6 - 20 mg/dL   Creatinine, Ser 0.66 0.44 - 1.00 mg/dL   Calcium 9.1 8.9 - 10.3 mg/dL   GFR calc non Af Amer >60 >60 mL/min   GFR calc Af Amer >60 >60 mL/min   Anion gap  10 5 - 15    Comment: Performed at [facility], [place] [address], [place], [place] [address]  CBC  once in am     Status: Abnormal   Collection Time: 03/09/19  2:03 AM  Result Value Ref Range   WBC 14.6 (H) 4.0 - 10.5 K/uL   RBC 3.66 (L) 3.87 - 5.11 MIL/uL   Hemoglobin 9.2 (L) 12.0 - 15.0 g/dL   HCT 29.3 (L) 36.0 - 46.0 %   MCV 80.1 80.0 - 100.0 fL   MCH 25.1 (L) 26.0 - 34.0 pg   MCHC 31.4 30.0 - 36.0 g/dL   RDW 14.4 11.5 - 15.5 %   Platelets 349 150 - 400 K/uL   nRBC 0.0 0.0 - 0.2 %    Comment: Performed at [facility], [place] [address], [place], [place] [address]  Basic metabolic panel     Status: Abnormal   Collection Time: 03/09/19  2:03 AM  Result Value Ref Range   Sodium 137 135 - 145 mmol/L   Potassium 4.0 3.5 - 5.1 mmol/L   Chloride 103 98 - 111 mmol/L   CO2 22 22 - 32 mmol/L   Glucose, Bld 138 (H) 70 - 99 mg/dL   BUN 9 6 - 20 mg/dL   Creatinine, Ser 0.51 0.44 - 1.00 mg/dL   Calcium 8.8 (L) 8.9 - 10.3 mg/dL   GFR calc non Af Amer >60 >60 mL/min   GFR calc Af Amer >60 >60 mL/min   Anion gap 12 5 - 15    Comment: Performed at [place] [address], [place], [place] [address]  Magnesium     Status: None   Collection Time: 03/09/19  2:03 AM  Result Value Ref Range   Magnesium 1.9 1.7 - 2.4 mg/dL    Comment: Performed at [facility], [place] [address], [place], [place] [address]    I have reviewed the patient's current medications.  ASSESSMENT: Principal Problem:   Acute respiratory failure with hypoxia (HCC) Active Problems:   Cholestasis of pregnancy, antepartum   Placenta previa   Asthma affecting pregnancy in third trimester   Hypokalemia   Bronchitis with asthma, acute   PLAN: Stable from OB standpoint Appreciate excellent hospitalist care Cont ursadiol Dc when ready per hospitalist team   Continue routine antenatal care.   Feliz Beam, M.D. [facility]ending [facility] [employer] (Faculty Practice)  03/09/2019 7:57  AM

## 2019-03-09 NOTE — Progress Notes (Signed)
Pt denies contractions, vaginal bleeding or leaking of fluid. FHR baseline 145, moderate variability, accels, no decels.

## 2019-03-10 MED ORDER — METHYLPREDNISOLONE SODIUM SUCC 40 MG IJ SOLR
40.0000 mg | Freq: Three times a day (TID) | INTRAMUSCULAR | Status: DC
  Administered 2019-03-10: 40 mg via INTRAVENOUS
  Filled 2019-03-10: qty 1

## 2019-03-10 MED ORDER — PREDNISONE 10 MG (21) PO TBPK: ORAL_TABLET | ORAL | 0 refills | Status: DC

## 2019-03-10 NOTE — Progress Notes (Signed)
   Vital Signs MEWS/VS Documentation      03/10/2019 0340 03/10/2019 0700 03/10/2019 0720 03/10/2019 0724   MEWS Score:  1  2  --  1   MEWS Score Color:  Green  Yellow  --  Green   Resp:  17  --  --  17   Pulse:  89  --  --  89   BP:  (!) 86/58  --  --  (!) 84/57   Temp:  97.6 F (36.4 C)  --  --  97.7 F (36.5 C)   O2 Device:  Nasal Cannula  --  Room Air  --   O2 Flow Rate (L/min):  2 L/min  --  --  --   Level of Consciousness:  Alert  --  --  --       Per monitor patient RR is 26, this RN assessed patient manually counting respirations at 17. Patients BP is not a new finding. Physicians are aware this admission. Patient is A&O x4 resting in bed in no distress.     Domanick Cuccia 03/10/2019,7:30 AM

## 2019-03-10 NOTE — Progress Notes (Signed)
Patient ID: Dawn Haney, female   DOB: 1985-09-29, 34 y.o.   MRN: [id number] FACULTY PRACTICE ANTEPARTUM(COMPREHENSIVE) NOTE  Dawn Haney is a 34 y.o. [id number] at [redacted]w[redacted]d by best clinical estimate who is admitted for respiratory failure with asthma exacerbation.   Fetal presentation is cephalic. Length of Stay:  3  Days  Subjective: Rested well last night, breathing easire Patient reports the fetal movement as active. Patient reports uterine contraction  activity as none. Patient reports  vaginal bleeding as none. Patient describes fluid per vagina as None.  Vitals:  Blood pressure (!) 86/58, pulse 89, temperature 97.6 F (36.4 C), temperature source Oral, resp. rate 17, height 5' (1.524 m), weight 62.8 kg, last menstrual period 08/03/2018, SpO2 96 %. Physical Examination:  General appearance - alert, well appearing, and in no distress Heart - normal rate and regular rhythm Abdomen - soft, nontender, nondistended Fundal Height:  size equals dates Cervical Exam: Not evaluated. Extremities: extremities normal, atraumatic, no cyanosis or edema and Homans sign is negative, no sign of DVT  Membranes:intact  Fetal Monitoring:     Fetal Heart Rate A  Mode External  [removed] filed at 03/10/2019 0050  Baseline Rate (A) 155 bpm filed at 03/10/2019 0030  Variability 6-25 BPM filed at 03/10/2019 0050  Accelerations 15 x 15 filed at 03/10/2019 0050  Decelerations None filed at 03/10/2019 0050     Labs:  No results found for this or any previous visit (from the past 24 hour(s)).   Medications:  Scheduled . albuterol  2.5 mg Nebulization QID  . Chlorhexidine Gluconate Cloth  6 each Topical Daily  . enoxaparin (LOVENOX) injection  40 mg Subcutaneous Q24H  . fluticasone  1 spray Each Nare Daily  . guaiFENesin  600 mg Oral BID  . loratadine  10 mg Oral Daily  . methylPREDNISolone (SOLU-MEDROL) injection  60 mg Intravenous Q8H  . ursodiol  300 mg Oral BID   I have reviewed the  patient's current medications.  ASSESSMENT: Patient Active Problem List   Diagnosis Date Noted  . Bronchitis with asthma, acute 03/08/2019  . Placenta previa 03/07/2019  . Asthma affecting pregnancy in third trimester 03/07/2019  . Acute respiratory failure with hypoxia (HCC) 03/07/2019  . Hypokalemia 03/07/2019  . Language barrier 02/11/2019  . Alpha thalassemia silent carrier 02/07/2019  . Cholestasis of pregnancy, antepartum 01/17/2019  . Previous preterm delivery, antepartum 01/14/2019  . Supervision of high risk pregnancy, antepartum 01/08/2019    PLAN: Doing well and will follow hospitalist recommendation for discharge  Scheryl Darter 03/10/2019,6:55 AM

## 2019-03-10 NOTE — Discharge Summary (Signed)
Antenatal Physician Discharge Summary  Patient ID: Dawn Haney MRN: [id number] DOB/AGE: [birth year] 34 y.o.  Admit date: 03/07/2019 Discharge date: 03/10/2019  Admission Diagnoses: acute respiratory failure with hypoxia, asthma exacerbation  Discharge Diagnoses:  Principal Problem:   Acute respiratory failure with hypoxia ([place]) Active Problems:   Supervision of high risk pregnancy, antepartum   Cholestasis of pregnancy, antepartum   Language barrier   Placenta previa   Asthma affecting pregnancy in third trimester   Hypokalemia   Bronchitis with asthma, acute   Prenatal Procedures: NST  Consults: Hospitalist  Hospital Course:  This is a 34 y.o. [id number] with IUP at [id number] for acute respiratory failure with hypoxia secondary to asthma. Concern for possible infectious bronchitis. The hospitalist team was consulted and and she was started on bipap. She improved on steroids and antibiotics and was weaned to room air as tolerated. Also with placenta previa now resolved and intrahepatic cholestasis of pregnancy. She reports no contractions, denies leaking or bleeding per vagina and reports active fetal movement. She was discharged home in stable condition and will be referred to PCP and likely pulmonology for follow up. Has steroid taper per hospitalist, appreciate excellent care.  To f/u in office, reviewed instructions for discharge. NST reactive today.  KSantiago Gladinterpretor used.  Discharge Exam: Temp:  [97.5 F (36.4 C)-98.5 F (36.9 C)] 98 F (36.7 C) (01/03 1525) Pulse Rate:  [89-109] 106 (01/03 1525) Resp:  [16-28] 20 (01/03 1128) BP: (84-97)/(51-65) 87/51 (01/03 1525) SpO2:  [93 %-97 %] 93 % (01/03 1525) Weight:  [62.8 kg] 62.8 kg (01/03 0645) Physical Examination: CONSTITUTIONAL: Well-developed, well-nourished female in no acute distress.  HENT:  Normocephalic, atraumatic, External right and left ear normal. Oropharynx is clear and moist EYES: Conjunctivae and  EOM are normal. Pupils are equal, round, and reactive to light. No scleral icterus.  NECK: Normal range of motion, supple, no masses SKIN: Skin is warm and dry. No rash noted. Not diaphoretic. No erythema. No pallor. NWilliams Alert and oriented to person, place, and time. Normal reflexes, muscle tone coordination. No cranial nerve deficit noted. PSYCHIATRIC: Normal mood and affect. Normal behavior. Normal judgment and thought content. CARDIOVASCULAR: Normal heart rate noted RESPIRATORY: Effort normal, no problems with respiration noted MUSCULOSKELETAL: Normal range of motion. No edema and no tenderness. 2+ distal pulses. ABDOMEN: Soft, nontender, nondistended, gravid. CERVIX:   deferred  Fetal monitoring: FHR: 145 bpm, Variability: moderate, Accelerations: Present, Decelerations: Absent  Uterine activity: no contractions per hour  Significant Diagnostic Studies:  Results for orders placed or performed during the hospital encounter of 03/07/19 (from the past 168 hour(s))  CBC with Differential   Collection Time: 03/07/19  3:15 AM  Result Value Ref Range   WBC 12.5 (H) 4.0 - 10.5 K/uL   RBC 4.16 3.87 - 5.11 MIL/uL   Hemoglobin 10.7 (L) 12.0 - 15.0 g/dL   HCT 33.9 (L) 36.0 - 46.0 %   MCV 81.5 80.0 - 100.0 fL   MCH 25.7 (L) 26.0 - 34.0 pg   MCHC 31.6 30.0 - 36.0 g/dL   RDW 14.3 11.5 - 15.5 %   Platelets 334 150 - 400 K/uL   nRBC 0.0 0.0 - 0.2 %   Neutrophils Relative % 78 %   Neutro Abs 9.8 (H) 1.7 - 7.7 K/uL   Lymphocytes Relative 12 %   Lymphs Abs 1.6 0.7 - 4.0 K/uL   Monocytes Relative 6 %   Monocytes Absolute 0.8 0.1 - 1.0 K/uL   Eosinophils Relative 2 %  Eosinophils Absolute 0.3 0.0 - 0.5 K/uL   Basophils Relative 1 %   Basophils Absolute 0.1 0.0 - 0.1 K/uL   Immature Granulocytes 1 %   Abs Immature Granulocytes 0.06 0.00 - 0.07 K/uL  Basic metabolic panel   Collection Time: 03/07/19  3:15 AM  Result Value Ref Range   Sodium 137 135 - 145 mmol/L   Potassium 3.2 (L)  3.5 - 5.1 mmol/L   Chloride 107 98 - 111 mmol/L   CO2 19 (L) 22 - 32 mmol/L   Glucose, Bld 125 (H) 70 - 99 mg/dL   BUN 6 6 - 20 mg/dL   Creatinine, Ser 0.52 0.44 - 1.00 mg/dL   Calcium 8.6 (L) 8.9 - 10.3 mg/dL   GFR calc non Af Amer >60 >60 mL/min   GFR calc Af Amer >60 >60 mL/min   Anion gap 11 5 - 15  POC SARS Coronavirus 2 Ag-ED - Nasal Swab (BD Veritor Kit)   Collection Time: 03/07/19  3:50 AM  Result Value Ref Range   SARS Coronavirus 2 Ag NEGATIVE NEGATIVE  Respiratory Panel by RT PCR (Flu A&B, Covid) - Nasopharyngeal Swab   Collection Time: 03/07/19  4:33 AM   Specimen: Nasopharyngeal Swab  Result Value Ref Range   SARS Coronavirus 2 by RT PCR NEGATIVE NEGATIVE   Influenza A by PCR NEGATIVE NEGATIVE   Influenza B by PCR NEGATIVE NEGATIVE  Glucose, capillary   Collection Time: 03/07/19 11:31 AM  Result Value Ref Range   Glucose-Capillary 137 (H) 70 - 99 mg/dL  MRSA PCR Screening   Collection Time: 03/07/19 11:35 AM   Specimen: Nasal Mucosa; Nasopharyngeal  Result Value Ref Range   MRSA by PCR NEGATIVE NEGATIVE  CBC with Differential/Platelet   Collection Time: 03/08/19  3:10 AM  Result Value Ref Range   WBC 14.7 (H) 4.0 - 10.5 K/uL   RBC 3.89 3.87 - 5.11 MIL/uL   Hemoglobin 9.9 (L) 12.0 - 15.0 g/dL   HCT 31.3 (L) 36.0 - 46.0 %   MCV 80.5 80.0 - 100.0 fL   MCH 25.4 (L) 26.0 - 34.0 pg   MCHC 31.6 30.0 - 36.0 g/dL   RDW 14.6 11.5 - 15.5 %   Platelets 360 150 - 400 K/uL   nRBC 0.0 0.0 - 0.2 %   Neutrophils Relative % 90 %   Neutro Abs 13.1 (H) 1.7 - 7.7 K/uL   Lymphocytes Relative 6 %   Lymphs Abs 0.9 0.7 - 4.0 K/uL   Monocytes Relative 3 %   Monocytes Absolute 0.4 0.1 - 1.0 K/uL   Eosinophils Relative 0 %   Eosinophils Absolute 0.0 0.0 - 0.5 K/uL   Basophils Relative 0 %   Basophils Absolute 0.0 0.0 - 0.1 K/uL   Immature Granulocytes 1 %   Abs Immature Granulocytes 0.17 (H) 0.00 - 0.07 K/uL  Basic metabolic panel   Collection Time: 03/08/19  3:10 AM  Result  Value Ref Range   Sodium 137 135 - 145 mmol/L   Potassium 4.4 3.5 - 5.1 mmol/L   Chloride 106 98 - 111 mmol/L   CO2 21 (L) 22 - 32 mmol/L   Glucose, Bld 156 (H) 70 - 99 mg/dL   BUN 8 6 - 20 mg/dL   Creatinine, Ser 0.66 0.44 - 1.00 mg/dL   Calcium 9.1 8.9 - 10.3 mg/dL   GFR calc non Af Amer >60 >60 mL/min   GFR calc Af Amer >60 >60 mL/min   Anion gap 10  5 - 15  CBC  once in am   Collection Time: 03/09/19  2:03 AM  Result Value Ref Range   WBC 14.6 (H) 4.0 - 10.5 K/uL   RBC 3.66 (L) 3.87 - 5.11 MIL/uL   Hemoglobin 9.2 (L) 12.0 - 15.0 g/dL   HCT 29.3 (L) 36.0 - 46.0 %   MCV 80.1 80.0 - 100.0 fL   MCH 25.1 (L) 26.0 - 34.0 pg   MCHC 31.4 30.0 - 36.0 g/dL   RDW 14.4 11.5 - 15.5 %   Platelets 349 150 - 400 K/uL   nRBC 0.0 0.0 - 0.2 %  Basic metabolic panel   Collection Time: 03/09/19  2:03 AM  Result Value Ref Range   Sodium 137 135 - 145 mmol/L   Potassium 4.0 3.5 - 5.1 mmol/L   Chloride 103 98 - 111 mmol/L   CO2 22 22 - 32 mmol/L   Glucose, Bld 138 (H) 70 - 99 mg/dL   BUN 9 6 - 20 mg/dL   Creatinine, Ser 0.51 0.44 - 1.00 mg/dL   Calcium 8.8 (L) 8.9 - 10.3 mg/dL   GFR calc non Af Amer >60 >60 mL/min   GFR calc Af Amer >60 >60 mL/min   Anion gap 12 5 - 15  Magnesium   Collection Time: 03/09/19  2:03 AM  Result Value Ref Range   Magnesium 1.9 1.7 - 2.4 mg/dL    Discharge Condition: Stable  Disposition: Discharge disposition: 01-Home or Self Care        Discharge Instructions    Notify physician for a general feeling that "something is not right"   Complete by: As directed    Notify physician for increase or change in vaginal discharge   Complete by: As directed    Notify physician for intestinal cramps, with or without diarrhea, sometimes described as "gas pain"   Complete by: As directed    Notify physician for leaking of fluid   Complete by: As directed    Notify physician for low, dull backache, unrelieved by heat or Tylenol   Complete by: As directed     Notify physician for menstrual like cramps   Complete by: As directed    Notify physician for pelvic pressure   Complete by: As directed    Notify physician for uterine contractions.  These may be painless and feel like the uterus is tightening or the baby is  "balling up"   Complete by: As directed    Notify physician for vaginal bleeding   Complete by: As directed    PRETERM LABOR:  Includes any of the follwing symptoms that occur between 20 - [redacted] weeks gestation.  If these symptoms are not stopped, preterm labor can result in preterm delivery, placing your baby at risk   Complete by: As directed      Allergies as of 03/10/2019      Reactions   Shrimp [shellfish Allergy] Itching   Tylenol [acetaminophen] Rash      Medication List    STOP taking these medications   AeroChamber Plus inhaler   cetirizine 10 MG tablet Commonly known as: ZYRTEC   fluticasone 50 MCG/ACT nasal spray Commonly known as: FLONASE   ipratropium-albuterol 0.5-2.5 (3) MG/3ML Soln Commonly known as: DUONEB     TAKE these medications   albuterol 108 (90 Base) MCG/ACT inhaler Commonly known as: VENTOLIN HFA Inhale 1-2 puffs into the lungs every 6 (six) hours as needed for wheezing or shortness of breath.  Blood Pressure Kit Devi 1 kit by Does not apply route once a week. Check BP regularly and record readings into the [employer] App.  Large Cuff.  Dx O90.0   multivitamin-prenatal 27-0.8 MG Tabs tablet Take 1 tablet by mouth daily at 12 noon.   predniSONE 10 MG (21) Tbpk tablet Commonly known as: STERAPRED UNI-PAK 21 TAB Per package insert   ursodiol 500 MG tablet Commonly known as: ACTIGALL Take 1 tablet (500 mg total) by mouth 2 (two) times daily.      Follow-up Information    [facility]. Schedule an appointment as soon as possible for a visit in 1 week(s).   Specialty: Obstetrics and Gynecology Contact information: [address], [place] [phone]          Signed: Feliz Beam, M.D. Attending [facility] for [employer] (Faculty Practice)  03/10/2019, 4:28 PM

## 2019-03-10 NOTE — Progress Notes (Signed)
PROGRESS NOTE-consult follow-up    Dawn Haney  XMI:[id number] DOB: Jun 02, 1985 DOA: 03/07/2019 PCP: Patient, No Pcp Per   Brief Narrative:  Dawn Haney an 34 y.o.Thaifemalewith past medical history significant for asthma, pregnancy at 30 weeks and 6 days, posteriorprevia, and cholestasis of pregnancy. She presents with complaints of progressively worsening shortness of breath. Patient complains of associated symptoms of a productive cough and runny nose. Associated symptoms include wheezing, palpitations, and malaise. Denies having any fever, chills, nausea, vomiting, diarrhea, or abdominal pain. She has been using her butyryl inhaler without relief at home   Assessment & Plan:   Principal Problem:   Acute respiratory failure with hypoxia (HCC) Active Problems:   Cholestasis of pregnancy, antepartum   Placenta previa   Asthma affecting pregnancy in third trimester   Hypokalemia   Bronchitis with asthma, acute   Acute Resp Failure w/ Hypoxia - Much better, continues off BiPap and weaned of [place] O2,  -rx pred taper on d/c -Follow-up with PCP for recommendations and further evaluation by pulmonology as an outpatient -Stable for discharge from hospitalist standpoint  Bronchitis w/ asthma - c/w home mdi, steroid taper on d/c  Hypokalemia - resolved - Mg  1.9  Cholestasis of pregnancy - ursodiol  Pregnant w/ Placenta Previa -PER [facility]  DVT prophylaxis: Per primary Code Status: Full code Family Communication: Per primary Disposition Plan:   Stable for discharge home from hospital standpoint Consults called: None Admission status: Inpatient   Consultants:   None  Procedures:  CT Angio Chest PE W and/or Wo Contrast  Result Date: 03/07/2019 CLINICAL DATA:  Shortness of breath for 3 days. History of asthma and third trimester pregnancy. EXAM: CT ANGIOGRAPHY CHEST WITH CONTRAST TECHNIQUE: Multidetector CT imaging of the chest was performed  using the standard protocol during bolus administration of intravenous contrast. Multiplanar CT image reconstructions and MIPs were obtained to evaluate the vascular anatomy. CONTRAST:  37mL OMNIPAQUE IOHEXOL 350 MG/ML SOLN COMPARISON:  None. FINDINGS: Cardiovascular: Adequate pulmonary artery opacification but significantly limited by motion artifact. No evidence of pulmonary embolism. Normal heart size. Normal aorta. Mediastinum/Nodes: Remote granulomatous calcification at the left hilum and mediastinum. Lungs/Pleura: Streaky atelectasis in the left lower lobe with airway narrowings. Small focus of airspace opacity in the right upper lobe. Asymmetric upper lobe aeration, more lucent on the left. Upper Abdomen: Negative Musculoskeletal: Negative Review of the MIP images confirms the above findings. IMPRESSION: 1. Airway thickening with patchy atelectasis and small focus of right upper lobe airspace disease. Findings correlate with history of asthma; there could be superimposed infectious bronchitis. 2. Respiratory motion limits pulmonary artery evaluation. No evidence of pulmonary embolism. 3. Remote granulomatous disease. Electronically Signed   By: Marnee Spring M.D.   On: 03/07/2019 06:24   [place] MFM OB Transvaginal  Result Date: 03/04/2019 ----------------------------------------------------------------------  OBSTETRICS REPORT                       (Signed Final 03/04/2019 12:39 pm) ---------------------------------------------------------------------- Patient Info  ID #:       [id number]                          D.O.B.:  1985/05/04 (33 yrs)  Name:       Dawn Haney              Visit Date: 03/04/2019 09:51 am ---------------------------------------------------------------------- Performed By  Performed By:     Marcellina Millin  Ref. Address:     [address] [place]                                                             [place]  Attending:        Tama High MD        [facility]  Referred By:      [place] ---------------------------------------------------------------------- Orders   #  Description                          Code         Ordered By   1  [place] MFM OB TRANSVAGINAL               [phone]      RAVI SHANKAR   2  [place] MFM FETAL BPP WO NON              76819.01     RAVI [facility]      STRESS  ----------------------------------------------------------------------   #  Order #                    Accession #                 Episode #   1  [id number]                  [id number]   2  [id number]                  [id number]  [id number]  ---------------------------------------------------------------------- Indications   Placenta previa specified as without           O44.03   hemorrhage, third trimester   [redacted] weeks gestation of pregnancy                Z3A.30   Cholestasis of pregnancy, third trimester      U13.244W10.2O26.613K83.1   Late to prenatal care, third trimester         O09.33  ---------------------------------------------------------------------- Fetal Evaluation  Num Of Fetuses:         1  Fetal Heart Rate(bpm):  131  Cardiac Activity:       Observed  Presentation:           Cephalic  Placenta:               Posterior  P. Cord Insertion:      Previously Visualized  Amniotic Fluid  AFI FV:      Within normal limits  AFI Sum(cm)     %Tile       Largest Pocket(cm)  14.9            52          5.3  RUQ(cm)       RLQ(cm)       LUQ(cm)        LLQ(cm)  5.3           3             1.6            5  Comment:    No placental previa identified. ---------------------------------------------------------------------- Biophysical Evaluation  Amniotic F.V:   Within  normal limits       F. Tone:        Observed  F. Movement:    Observed                   Score:          8/8  F. Breathing:   Observed ---------------------------------------------------------------------- OB History  Gravidity:    3         Term:   2        Prem:   0        SAB:   0  TOP:          0       Ectopic:  0        Living: 2 ---------------------------------------------------------------------- Gestational Age  LMP:           30w 3d        Date:  08/03/18                 EDD:   05/10/19  Best:          Georgiann Hahn30w 3d     Det. By:  LMP  (08/03/18)          EDD:   05/10/19 ---------------------------------------------------------------------- Anatomy  Stomach:               Appears normal, left   Bladder:                Appears normal                         sided ---------------------------------------------------------------------- Cervix Uterus Adnexa  Cervix  Length:            5.9  cm.  Normal appearance by transvaginal scan  Adnexa  No abnormality visualized. ---------------------------------------------------------------------- Impression  Cholestasis of pregnancy.  Patient return for antenatal testing  and transvaginal evaluation to assess placental location.  She does not give history of vaginal bleeding.  Amniotic fluid is normal and good fetal activity seen.Antenatal  testing is reassuring. BPP 8/8.  On transvaginal ultrasound, there is no evidence of placenta  previa.  We eassured the patient of the findings ---------------------------------------------------------------------- Recommendations  -Continue weekly BPP till delivery. ----------------------------------------------------------------------                  Noralee Space, MD Electronically Signed Final Report   03/04/2019 12:39 pm ----------------------------------------------------------------------  DG Chest Portable 1 View  Result Date: 03/07/2019 CLINICAL DATA:  Shortness of breath and hypoxia. Pregnant patient at [redacted] weeks gestation.  EXAM: PORTABLE CHEST 1 VIEW COMPARISON:  Radiograph 06/19/2018 FINDINGS: Low lung volumes. Central bronchial thickening. Streaky right lung base and retrocardiac atelectasis. No pleural fluid. No pneumothorax or pulmonary edema. No acute osseous abnormalities are seen. IMPRESSION: Low lung volumes with central bronchial thickening and streaky right lung base and retrocardiac atelectasis. Electronically Signed   By: Narda Rutherford M.D.   On: 03/07/2019 05:17   [place] MFM FETAL BPP WO NON STRESS  Result Date: 03/04/2019 ----------------------------------------------------------------------  OBSTETRICS REPORT                       (Signed Final 03/04/2019 12:39 pm) ---------------------------------------------------------------------- Patient Info  ID #:       [id number]                          D.O.B.:  Oct 26, 1985 (33 yrs)  Name:       Dawn Haney              Visit Date: 03/04/2019 09:51 am ---------------------------------------------------------------------- Performed By  Performed By:     Marcellina Millin          Ref. Address:     [address]                                                             [address]                                                             [place]  [id number]  Attending:        Noralee Spaceavi Shankar MD        [facility]  [facility] ---------------------------------------------------------------------- Orders   #  Description                          Code         Ordered By   1  US MFM OB TRANSVAGINAL               [phone]      RAVI SHANKAR   2  US MFM FETAL BPP WO NON              76819.01     [facility]      STR[facility]  ----------------------------------------------------------------------   #  Order #                     Accession #                 Episode #   1  [id number]                  [id number]   2  [id number]                  [id number]  ---------------------------------------------------------------------- Indications   Placenta previa specified as without           O44.03   hemorrhage, third trimester   [redacted] weeks gestation of pregnancy                Z3A.30   Cholestasis of pregnancy, third trimester      Z56.387F64.3O26.613K83.1   Late to prenatal care, third trimester         O09.33  ---------------------------------------------------------------------- Fetal Evaluation  Num Of Fetuses:         1  Fetal Heart Rate(bpm):  131  Cardiac Activity:       Observed  Presentation:           Cephalic  Placenta:               Posterior  P. Cord Insertion:      Previously Visualized  Amniotic Fluid  AFI FV:      Within normal limits  AFI Sum(cm)     %Tile       Largest Pocket(cm)  14.9            52          5.3  RUQ(cm)       RLQ(cm)       LUQ(cm)        LLQ(cm)  5.3           3             1.6  5  Comment:    No placental previa identified. ---------------------------------------------------------------------- Biophysical Evaluation  Amniotic F.V:   Within normal limits       F. Tone:        Observed  F. Movement:    Observed                   Score:          8/8  F. Breathing:   Observed ---------------------------------------------------------------------- OB History  Gravidity:    3         Term:   2        Prem:   0        SAB:   0  TOP:          0       Ectopic:  0        Living: 2 ---------------------------------------------------------------------- Gestational Age  LMP:           30w 3d        Date:  08/03/18                 EDD:   05/10/19  Best:          Georgiann Hahn 3d     Det. By:  LMP  (08/03/18)          EDD:   05/10/19 ---------------------------------------------------------------------- Anatomy  Stomach:               Appears normal, left   Bladder:                Appears  normal                         sided ---------------------------------------------------------------------- Cervix Uterus Adnexa  Cervix  Length:            5.9  cm.  Normal appearance by transvaginal scan  Adnexa  No abnormality visualized. ---------------------------------------------------------------------- Impression  Cholestasis of pregnancy.  Patient return for antenatal testing  and transvaginal evaluation to assess placental location.  She does not give history of vaginal bleeding.  Amniotic fluid is normal and good fetal activity seen.Antenatal  testing is reassuring. BPP 8/8.  On transvaginal ultrasound, there is no evidence of placenta  previa.  We eassured the patient of the findings ---------------------------------------------------------------------- Recommendations  -Continue weekly BPP till delivery. ----------------------------------------------------------------------                  Noralee Space, MD Electronically Signed Final Report   03/04/2019 12:39 pm ----------------------------------------------------------------------  [place] MFM OB DETAIL +14 WK  Result Date: 02/21/2019 ----------------------------------------------------------------------  OBSTETRICS REPORT                       (Signed Final 02/21/2019 04:50 pm) ---------------------------------------------------------------------- Patient Info  ID #:       [id number]                          D.O.B.:  02-Nov-1985 (33 yrs)  Name:       Dawn Haney              Visit Date: 02/21/2019 04:19 pm ---------------------------------------------------------------------- Performed By  Performed By:     Eden Lathe BS      Ref. Address:     [address]                    [address]                                                             [address]                                                             [address]                                                             [place]                                                              [id number]  Attending:        Noralee Space MD        Location[facility]  Referred By:      [facility] ---------------------------------------------------------------------- Orders   #  Description                          Code         Ordered By   1  [place] MFM OB DETAIL +14 WK              76811.01     KELLY DAVIS  ----------------------------------------------------------------------   #  Order #                    Accession #                 Episode #   1  [id number]                  [id number]                  [id number]  ---------------------------------------------------------------------- Indications   Antenatal screening for malformations          Z36.3   Late to prenatal care, third trimester         O09.33   Cholestasis of pregnancy, third trimester      I69.629B28.4   [redacted] weeks gestation of pregnancy                Z3A.28  ---------------------------------------------------------------------- Fetal Evaluation  Num Of Fetuses:  1  Fetal Heart Rate(bpm):  143  Cardiac Activity:       Observed  Presentation:           Breech  Placenta:               Posterior Previa  P. Cord Insertion:      Visualized  Amniotic Fluid  AFI FV:      Within normal limits  AFI Sum(cm)     %Tile       Largest Pocket(cm)  22.68           93          6.34  RUQ(cm)       RLQ(cm)       LUQ(cm)        LLQ(cm)  5.64          5.46          6.34           5.24 ---------------------------------------------------------------------- Biometry  BPD:      73.1  mm     G. Age:  29w 2d         53  %    CI:        71.14   %    70 - 86                                                          FL/HC:      18.5   %    19.6 - 20.8  HC:      276.1  mm     G. Age:  30w 1d         58  %    HC/AC:      1.08        0.99 - 1.21  AC:       255   mm     G. Age:  29w 5d         69  %    FL/BPD:     69.9   %    71 - 87  FL:       51.1  mm     G. Age:  27w 3d          6   %    FL/AC:      20.0   %    20 - 24  HUM:      46.1  mm     G. Age:  27w 1d          9  %  CER:      35.4  mm     G. Age:  30w 3d         79  %  CM:        5.5  mm  Est. FW:    1310  gm    2 lb 14 oz      40  % ---------------------------------------------------------------------- OB History  Gravidity:    3         Term:   2        Prem:   0        SAB:   0  TOP:          0  Ectopic:  0        Living: 2 ---------------------------------------------------------------------- Gestational Age  LMP:           28w 6d        Date:  08/03/18                 EDD:   05/10/19  U/S Today:     29w 1d                                        EDD:   05/08/19  Best:          28w 6d     Det. By:  LMP  (08/03/18)          EDD:   05/10/19 ---------------------------------------------------------------------- Anatomy  Cranium:               Appears normal         LVOT:                   Appears normal  Cavum:                 Appears normal         Aortic Arch:            Appears normal  Ventricles:            Appears normal         Ductal Arch:            Appears normal  Choroid Plexus:        Appears normal         Diaphragm:              Appears normal  Cerebellum:            Appears normal         Stomach:                Appears normal, left                                                                        sided  Posterior Fossa:       Appears normal         Abdomen:                Appears normal  Nuchal Fold:           Appears normal         Abdominal Wall:         Appears nml (cord                                                                        insert, abd wall)  Face:                  Orbits nl; profile not Cord Vessels:  Appears normal (3                         well visualized                                vessel cord)  Lips:                  Appears normal         Kidneys:                Appear normal  Palate:                Not well visualized    Bladder:                Appears normal  Thoracic:               Appears normal         Spine:                  Appears normal  Heart:                 Appears normal         Upper Extremities:      Appears normal                         (4CH, axis, and                         situs)  RVOT:                  Appears normal         Lower Extremities:      Appears normal  Other:  Heels/feet and open hands/5th digits visualized. Nasal bone          visualized. ---------------------------------------------------------------------- Cervix Uterus Adnexa  Cervix  Length:            3.7  cm.  Normal appearance by transabdominal scan.  Uterus  No abnormality visualized.  Left Ovary  Within normal limits.  Right Ovary  Within normal limits.  Cul De Sac  No free fluid seen.  Adnexa  No abnormality visualized. ---------------------------------------------------------------------- Impression  G3 P2.  Patient has a diagnosis of cholestasis of pregnancy.  She reports her symptoms of itching have improved.  She is  not taking ursodiol.  Liver enzymes have been within normal  range.  Obstetric history significant for 2 previous term vaginal  deliveries.  Patient does not have gestational diabetes.  She  reports no chronic medical conditions.  On ultrasound, amniotic fluid is normal good fetal activity is  seen.  Fetal growth is appropriate for gestational age.  Fetal  anatomical survey appears normal, but limited by advanced  gestational age.  Incidentally observed antenatal testing is  reassuring (BPP 8/8).  On transabdominal scan placenta previa is seen.  Patient  does not have history of vaginal bleeding. ---------------------------------------------------------------------- Recommendations  -BPP and transvaginal ultrasound for placental assessment  on 03/04/19  -BPP in 3 weeks and then weekly BPP till delivery. ----------------------------------------------------------------------                  Noralee Space, MD Electronically Signed Final Report   02/21/2019 04:50 pm  ----------------------------------------------------------------------    Antimicrobials:   Per primary  Subjective: Patient been weaned off oxygen feels she is close or near baseline  Objective: Vitals:   03/10/19 0720 03/10/19 0724 03/10/19 1115 03/10/19 1128  BP:  (!) 84/57  (!) 93/55  Pulse:  89  (!) 105  Resp:  17  (!) 24  Temp:  97.7 F (36.5 C)  (!) 97.5 F (36.4 C)  TempSrc:  Oral  Oral  SpO2: 96% 96% 95% 95%  Weight:      Height:        Intake/Output Summary (Last 24 hours) at 03/10/2019 1244 Last data filed at 03/10/2019 0645 Gross per 24 hour  Intake --  Output 1400 ml  Net -1400 ml   Filed Weights   03/07/19 0302 03/07/19 1000 03/10/19 0645  Weight: 61.2 kg 62.6 kg 62.8 kg    Examination:  General exam: Appears calm and comfortable  Respiratory system: Clear to auscultation. Respiratory effort normal. Cardiovascular system: S1 & S2 heard, RRR. No JVD, murmurs, rubs, gallops or clicks. No pedal edema. Gastrointestinal system: Abdomen is nondistended, soft and nontender. No organomegaly or masses felt. Normal bowel sounds heard.  Pregnant Central nervous system: Alert and oriented. No focal neurological deficits. Extremities: Warm and well perfused Skin: No rashes, lesions or ulcers Psychiatry: Judgement and insight appear normal. Mood & affect appropriate.     Data Reviewed: I have personally reviewed following labs and imaging studies  CBC: Recent Labs  Lab 03/07/19 0315 03/08/19 0310 03/09/19 0203  WBC 12.5* 14.7* 14.6*  NEUTROABS 9.8* 13.1*  --   HGB 10.7* 9.9* 9.2*  HCT 33.9* 31.3* 29.3*  MCV 81.5 80.5 80.1  PLT 334 360 349   Basic Metabolic Panel: Recent Labs  Lab 03/07/19 0315 03/08/19 0310 03/09/19 0203  NA 137 137 137  K 3.2* 4.4 4.0  CL 107 106 103  CO2 19* 21* 22  GLUCOSE 125* 156* 138*  BUN 6 8 9   CREATININE 0.52 0.66 0.51  CALCIUM 8.6* 9.1 8.8*  MG  --   --  1.9   GFR: Estimated Creatinine Clearance: 83.5 mL/min  (by C-G formula based on SCr of 0.51 mg/dL). Liver Function Tests: No results for input(s): AST, ALT, ALKPHOS, BILITOT, PROT, ALBUMIN in the last 168 hours. No results for input(s): LIPASE, AMYLASE in the last 168 hours. No results for input(s): AMMONIA in the last 168 hours. Coagulation Profile: No results for input(s): INR, PROTIME in the last 168 hours. Cardiac Enzymes: No results for input(s): CKTOTAL, CKMB, CKMBINDEX, TROPONINI in the last 168 hours. BNP (last 3 results) No results for input(s): PROBNP in the last 8760 hours. HbA1C: No results for input(s): HGBA1C in the last 72 hours. CBG: Recent Labs  Lab 03/07/19 1131  GLUCAP 137*   Lipid Profile: No results for input(s): CHOL, HDL, LDLCALC, TRIG, CHOLHDL, LDLDIRECT in the last 72 hours. Thyroid Function Tests: No results for input(s): TSH, T4TOTAL, FREET4, T3FREE, THYROIDAB in the last 72 hours. Anemia Panel: No results for input(s): VITAMINB12, FOLATE, FERRITIN, TIBC, IRON, RETICCTPCT in the last 72 hours. Sepsis Labs: No results for input(s): PROCALCITON, LATICACIDVEN in the last 168 hours.  Recent Results (from the past 240 hour(s))  Respiratory Panel by RT PCR (Flu A&B, Covid) - Nasopharyngeal Swab     Status: None   Collection Time: 03/07/19  4:33 AM   Specimen: Nasopharyngeal Swab  Result Value Ref Range Status   SARS Coronavirus 2 by RT PCR NEGATIVE NEGATIVE Final    Comment: (NOTE) SARS-CoV-2 target nucleic acids are NOT  DETECTED. The SARS-CoV-2 RNA is generally detectable in upper respiratoy specimens during the acute phase of infection. The lowest concentration of SARS-CoV-2 viral copies this assay can detect is 131 copies/mL. A negative result does not preclude SARS-Cov-2 infection and should not be used as the sole basis for treatment or other patient management decisions. A negative result may occur with  improper specimen collection/handling, submission of specimen other than nasopharyngeal swab,  presence of viral mutation(s) within the areas targeted by this assay, and inadequate number of viral copies (<131 copies/mL). A negative result must be combined with clinical observations, patient history, and epidemiological information. The expected result is Negative. Fact Sheet for Patients:  https://www.moore.com/ Fact Sheet for Healthcare Providers:  https://www.young.biz/ This test is not yet ap proved or cleared by the [place] FDA and  has been authorized for detection and/or diagnosis of SARS-CoV-2 by FDA under an Emergency Use Authorization (EUA). This EUA will remain  in effect (meaning this test can be used) for the duration of the COVID-19 declaration under Section 564(b)(1) of the Act, 21 U.S.C. section 360bbb-3(b)(1), unless the authorization is terminated or revoked sooner.    Influenza A by PCR NEGATIVE NEGATIVE Final   Influenza B by PCR NEGATIVE NEGATIVE Final    Comment: (NOTE) The Xpert Xpress SARS-CoV-2/FLU/RSV assay is intended as an aid in  the diagnosis of influenza from Nasopharyngeal swab specimens and  should not be used as a sole basis for treatment. Nasal washings and  aspirates are unacceptable for Xpert Xpress SARS-CoV-2/FLU/RSV  testing. Fact Sheet for Patients: https://www.moore.com/ Fact Sheet for Healthcare Providers: https://www.young.biz/ This test is not yet approved or cleared by the [place] FDA and  has been authorized for detection and/or diagnosis of SARS-CoV-2 by  FDA under an Emergency Use Authorization (EUA). This EUA will remain  in effect (meaning this test can be used) for the duration of the  Covid-19 declaration under Section 564(b)(1) of the Act, 21  U.S.C. section 360bbb-3(b)(1), unless the authorization is  terminated or revoked. Performed at [facility], [address] [place], [place] [address]   MRSA PCR Screening     Status:  None   Collection Time: 03/07/19 11:35 AM   Specimen: Nasal Mucosa; Nasopharyngeal  Result Value Ref Range Status   MRSA by PCR NEGATIVE NEGATIVE Final    Comment:        The GeneXpert MRSA Assay (FDA approved for NASAL specimens only), is one component of a comprehensive MRSA colonization surveillance program. It is not intended to diagnose MRSA infection nor to guide or monitor treatment for MRSA infections. Performed at [facility], [address], [place], [place] [address]          Radiology Studies: No results found.      Scheduled Meds: . albuterol  2.5 mg Nebulization QID  . Chlorhexidine Gluconate Cloth  6 each Topical Daily  . enoxaparin (LOVENOX) injection  40 mg Subcutaneous Q24H  . fluticasone  1 spray Each Nare Daily  . guaiFENesin  600 mg Oral BID  . loratadine  10 mg Oral Daily  . methylPREDNISolone (SOLU-MEDROL) injection  40 mg Intravenous Q8H  . ursodiol  300 mg Oral BID   Continuous Infusions: . sodium chloride    . cefTRIAXone (ROCEPHIN)  IV 1 g (03/09/19 1316)     LOS: 3 days    Time spent: 25 minutes    Burke Keels, MD [facility]  If 7PM-7AM, please contact night-coverage  03/10/2019, 12:44 PM

## 2019-03-10 NOTE — Progress Notes (Signed)
Fetal monitoring completed. FHR baseline 145-150, moderate variability, accels, no decels. Pt reports good fetal movement. No vaginal bleeding or leaking of fluid.Pt is to be d/c. Pt has an  Appointment with her OB tomorrow at 1500.

## 2019-03-10 NOTE — Progress Notes (Signed)
RROB at bedside for EFM.  FHR baseline 155, moderate variability, accelerations present, no decelerations. NST reactive and reassuring.  Patient reports no LOF, vaginal bleeding, contractions.  Reports positive fetal movement. No complaints of pain. Unable to capture [facility] in Epic. Paper strip ran.

## 2019-03-11 ENCOUNTER — Ambulatory Visit (HOSPITAL_COMMUNITY): Payer: Medicaid Other | Admitting: *Deleted

## 2019-03-11 ENCOUNTER — Ambulatory Visit (HOSPITAL_COMMUNITY)
Admission: RE | Admit: 2019-03-11 | Discharge: 2019-03-11 | Disposition: A | Discharge status: Home / Self Care | Payer: Medicaid Other | Source: Ambulatory Visit | Attending: Obstetrics and Gynecology | Admitting: Obstetrics and Gynecology

## 2019-03-11 ENCOUNTER — Other Ambulatory Visit: Payer: Self-pay

## 2019-03-11 ENCOUNTER — Encounter (HOSPITAL_COMMUNITY): Payer: Self-pay

## 2019-03-11 DIAGNOSIS — O4403 Placenta previa specified as without hemorrhage, third trimester: Secondary | ICD-10-CM

## 2019-03-11 DIAGNOSIS — O09219 Supervision of pregnancy with history of pre-term labor, unspecified trimester: Secondary | ICD-10-CM | POA: Diagnosis present

## 2019-03-11 DIAGNOSIS — D563 Thalassemia minor: Secondary | ICD-10-CM | POA: Diagnosis present

## 2019-03-11 DIAGNOSIS — O26649 Intrahepatic cholestasis of pregnancy, unspecified trimester: Secondary | ICD-10-CM

## 2019-03-11 DIAGNOSIS — O0933 Supervision of pregnancy with insufficient antenatal care, third trimester: Secondary | ICD-10-CM

## 2019-03-11 DIAGNOSIS — O26619 Liver and biliary tract disorders in pregnancy, unspecified trimester: Secondary | ICD-10-CM | POA: Diagnosis present

## 2019-03-11 DIAGNOSIS — O0990 Supervision of high risk pregnancy, unspecified, unspecified trimester: Secondary | ICD-10-CM | POA: Insufficient documentation

## 2019-03-11 DIAGNOSIS — Z3A31 31 weeks gestation of pregnancy: Secondary | ICD-10-CM

## 2019-03-11 DIAGNOSIS — K831 Obstruction of bile duct: Secondary | ICD-10-CM

## 2019-03-11 DIAGNOSIS — O26613 Liver and biliary tract disorders in pregnancy, third trimester: Secondary | ICD-10-CM

## 2019-03-18 ENCOUNTER — Other Ambulatory Visit (HOSPITAL_COMMUNITY): Payer: Self-pay | Admitting: *Deleted

## 2019-03-18 ENCOUNTER — Encounter (HOSPITAL_COMMUNITY): Payer: Self-pay | Admitting: *Deleted

## 2019-03-18 ENCOUNTER — Other Ambulatory Visit: Payer: Self-pay

## 2019-03-18 ENCOUNTER — Ambulatory Visit (HOSPITAL_COMMUNITY)
Admission: RE | Admit: 2019-03-18 | Discharge: 2019-03-18 | Disposition: A | Discharge status: Home / Self Care | Payer: Medicaid Other | Source: Ambulatory Visit | Attending: Obstetrics and Gynecology | Admitting: Obstetrics and Gynecology

## 2019-03-18 ENCOUNTER — Ambulatory Visit (HOSPITAL_COMMUNITY): Payer: Medicaid Other | Admitting: *Deleted

## 2019-03-18 DIAGNOSIS — O26613 Liver and biliary tract disorders in pregnancy, third trimester: Secondary | ICD-10-CM | POA: Diagnosis not present

## 2019-03-18 DIAGNOSIS — K831 Obstruction of bile duct: Secondary | ICD-10-CM

## 2019-03-18 DIAGNOSIS — Z3A32 32 weeks gestation of pregnancy: Secondary | ICD-10-CM

## 2019-03-18 DIAGNOSIS — O0933 Supervision of pregnancy with insufficient antenatal care, third trimester: Secondary | ICD-10-CM | POA: Diagnosis not present

## 2019-03-18 DIAGNOSIS — O4403 Placenta previa specified as without hemorrhage, third trimester: Secondary | ICD-10-CM

## 2019-03-18 DIAGNOSIS — D563 Thalassemia minor: Secondary | ICD-10-CM | POA: Insufficient documentation

## 2019-03-18 DIAGNOSIS — O0990 Supervision of high risk pregnancy, unspecified, unspecified trimester: Secondary | ICD-10-CM | POA: Diagnosis present

## 2019-03-18 DIAGNOSIS — O09219 Supervision of pregnancy with history of pre-term labor, unspecified trimester: Secondary | ICD-10-CM

## 2019-03-18 DIAGNOSIS — O26619 Liver and biliary tract disorders in pregnancy, unspecified trimester: Secondary | ICD-10-CM | POA: Insufficient documentation

## 2019-03-18 DIAGNOSIS — Z362 Encounter for other antenatal screening follow-up: Secondary | ICD-10-CM

## 2019-03-20 ENCOUNTER — Other Ambulatory Visit: Payer: Self-pay | Admitting: Obstetrics and Gynecology

## 2019-03-20 DIAGNOSIS — J9601 Acute respiratory failure with hypoxia: Secondary | ICD-10-CM

## 2019-03-20 DIAGNOSIS — J45909 Unspecified asthma, uncomplicated: Secondary | ICD-10-CM

## 2019-03-20 DIAGNOSIS — Z789 Other specified health status: Secondary | ICD-10-CM

## 2019-03-21 ENCOUNTER — Encounter: Payer: Self-pay | Admitting: Obstetrics and Gynecology

## 2019-03-21 ENCOUNTER — Other Ambulatory Visit: Payer: Self-pay

## 2019-03-21 ENCOUNTER — Ambulatory Visit (INDEPENDENT_AMBULATORY_CARE_PROVIDER_SITE_OTHER): Payer: Medicaid Other | Admitting: Obstetrics and Gynecology

## 2019-03-21 VITALS — BP 96/66 | HR 93 | Wt 135.0 lb

## 2019-03-21 DIAGNOSIS — J45909 Unspecified asthma, uncomplicated: Secondary | ICD-10-CM

## 2019-03-21 DIAGNOSIS — O99513 Diseases of the respiratory system complicating pregnancy, third trimester: Secondary | ICD-10-CM

## 2019-03-21 DIAGNOSIS — O0990 Supervision of high risk pregnancy, unspecified, unspecified trimester: Secondary | ICD-10-CM

## 2019-03-21 DIAGNOSIS — O26613 Liver and biliary tract disorders in pregnancy, third trimester: Secondary | ICD-10-CM

## 2019-03-21 DIAGNOSIS — Z789 Other specified health status: Secondary | ICD-10-CM

## 2019-03-21 DIAGNOSIS — K831 Obstruction of bile duct: Secondary | ICD-10-CM

## 2019-03-21 DIAGNOSIS — O4403 Placenta previa specified as without hemorrhage, third trimester: Secondary | ICD-10-CM

## 2019-03-21 DIAGNOSIS — O0993 Supervision of high risk pregnancy, unspecified, third trimester: Secondary | ICD-10-CM

## 2019-03-21 DIAGNOSIS — Z3A32 32 weeks gestation of pregnancy: Secondary | ICD-10-CM

## 2019-03-21 DIAGNOSIS — O26619 Liver and biliary tract disorders in pregnancy, unspecified trimester: Secondary | ICD-10-CM

## 2019-03-21 MED ORDER — FAMOTIDINE 20 MG PO TABS: 20.0000 mg | ORAL_TABLET | Freq: Every day | ORAL | 3 refills | Status: DC

## 2019-03-21 MED ORDER — BLOOD PRESSURE KIT DEVI: 1.0000 | 0 refills | Status: DC

## 2019-03-21 NOTE — Progress Notes (Signed)
Pt states she had recent admission in hospital due to Asthma. Pt states she is doing better. Pt states she needs Rx for heartburn.

## 2019-03-21 NOTE — Progress Notes (Signed)
   PRENATAL VISIT NOTE  Subjective:  Dawn Haney is a 34 y.o. [id number] at [redacted]w[redacted]d being seen today for ongoing prenatal care.  She is currently monitored for the following issues for this high-risk pregnancy and has Supervision of high risk pregnancy, antepartum; Previous preterm delivery, antepartum; Cholestasis of pregnancy, antepartum; Alpha thalassemia silent carrier; Language barrier; Placenta previa; Asthma affecting pregnancy in third trimester; Acute respiratory failure with hypoxia (HCC); Hypokalemia; and Bronchitis with asthma, acute on their problem list.  Patient reports no complaints. Is having some heartburn but overall feeling very well. Contractions: Not present. Vag. Bleeding: None.  Movement: Present. Denies leaking of fluid.   The following portions of the patient's history were reviewed and updated as appropriate: allergies, current medications, past family history, past medical history, past social history, past surgical history and problem list.   Objective:   Vitals:   03/21/19 1435  BP: 96/66  Pulse: 93  Weight: 135 lb (61.2 kg)    Fetal Status: Fetal Heart Rate (bpm): 145   Movement: Present     General:  Alert, oriented and cooperative. Patient is in no acute distress.  Skin: Skin is warm and dry. No rash noted.   Cardiovascular: Normal heart rate noted  Respiratory: Normal respiratory effort, no problems with respiration noted  Abdomen: Soft, gravid, appropriate for gestational age.  Pain/Pressure: Absent     Pelvic: Cervical exam deferred        Extremities: Normal range of motion.     Mental Status: Normal mood and affect. Normal behavior. Normal judgment and thought content.   Assessment and Plan:  Pregnancy: [id number] at [redacted]w[redacted]d  1. Supervision of high risk pregnancy, antepartum pepcid sent for heartburn  2. Cholestasis of pregnancy, antepartum Cont actigall Weekly BPP Delivery at 37 weeks  3. Asthma affecting pregnancy in third trimester Has  appt with pulmonology Reports she finished her steroid taper and is feeling much better Encouraged her to use inhaler prn and return to hospital with worsening symptoms  4. Language barrier  5. Placenta previa in third trimester resolved  Preterm labor symptoms and general obstetric precautions including but not limited to vaginal bleeding, contractions, leaking of fluid and fetal movement were reviewed in detail with the patient. Please refer to After Visit Summary for other counseling recommendations.   Return in about 2 weeks (around 04/04/2019) for high OB, in person.  Future Appointments  Date Time Provider Department Center  03/25/2019  3:45 PM WH-MFC NURSE WH-MFC MFC-US  03/25/2019  3:45 PM WH-MFC [place] 2 WH-MFCUS MFC-US  04/01/2019  2:15 PM WH-MFC NURSE WH-MFC MFC-US  04/01/2019  2:15 PM WH-MFC [place] 4 WH-MFCUS MFC-US  04/04/2019  1:45 PM Conan Bowens, MD [facility] None  04/08/2019  1:30 PM WH-MFC NURSE WH-MFC MFC-US  04/08/2019  1:30 PM WH-MFC [place] 1 WH-MFCUS MFC-US  04/15/2019  2:15 PM WH-MFC NURSE WH-MFC MFC-US  04/15/2019  2:15 PM WH-MFC [place] 4 WH-MFCUS MFC-US    Conan Bowens, MD

## 2019-03-21 NOTE — Addendum Note (Signed)
Addended by: Marya Landry D on: 03/21/2019 03:06 PM   Modules accepted: Orders

## 2019-03-25 ENCOUNTER — Other Ambulatory Visit: Payer: Self-pay

## 2019-03-25 ENCOUNTER — Ambulatory Visit (HOSPITAL_COMMUNITY): Payer: Medicaid Other | Admitting: *Deleted

## 2019-03-25 ENCOUNTER — Encounter (HOSPITAL_COMMUNITY): Payer: Self-pay

## 2019-03-25 ENCOUNTER — Ambulatory Visit (HOSPITAL_COMMUNITY)
Admission: RE | Admit: 2019-03-25 | Discharge: 2019-03-25 | Disposition: A | Discharge status: Home / Self Care | Payer: Medicaid Other | Source: Ambulatory Visit | Attending: Obstetrics and Gynecology | Admitting: Obstetrics and Gynecology

## 2019-03-25 DIAGNOSIS — O26613 Liver and biliary tract disorders in pregnancy, third trimester: Secondary | ICD-10-CM

## 2019-03-25 DIAGNOSIS — O0990 Supervision of high risk pregnancy, unspecified, unspecified trimester: Secondary | ICD-10-CM

## 2019-03-25 DIAGNOSIS — K831 Obstruction of bile duct: Secondary | ICD-10-CM | POA: Diagnosis present

## 2019-03-25 DIAGNOSIS — D563 Thalassemia minor: Secondary | ICD-10-CM | POA: Insufficient documentation

## 2019-03-25 DIAGNOSIS — O09219 Supervision of pregnancy with history of pre-term labor, unspecified trimester: Secondary | ICD-10-CM | POA: Insufficient documentation

## 2019-03-25 DIAGNOSIS — O0933 Supervision of pregnancy with insufficient antenatal care, third trimester: Secondary | ICD-10-CM

## 2019-03-25 DIAGNOSIS — O26619 Liver and biliary tract disorders in pregnancy, unspecified trimester: Secondary | ICD-10-CM | POA: Diagnosis present

## 2019-03-25 DIAGNOSIS — Z3A33 33 weeks gestation of pregnancy: Secondary | ICD-10-CM

## 2019-03-25 DIAGNOSIS — O4403 Placenta previa specified as without hemorrhage, third trimester: Secondary | ICD-10-CM

## 2019-04-01 ENCOUNTER — Ambulatory Visit (HOSPITAL_COMMUNITY): Payer: Medicaid Other

## 2019-04-01 ENCOUNTER — Encounter (HOSPITAL_COMMUNITY): Payer: Self-pay

## 2019-04-04 ENCOUNTER — Encounter: Payer: Self-pay | Admitting: Obstetrics and Gynecology

## 2019-04-04 ENCOUNTER — Other Ambulatory Visit: Payer: Self-pay

## 2019-04-04 ENCOUNTER — Ambulatory Visit (INDEPENDENT_AMBULATORY_CARE_PROVIDER_SITE_OTHER): Payer: Medicaid Other | Admitting: Obstetrics and Gynecology

## 2019-04-04 VITALS — BP 104/68 | HR 96 | Wt 138.0 lb

## 2019-04-04 DIAGNOSIS — J45909 Unspecified asthma, uncomplicated: Secondary | ICD-10-CM

## 2019-04-04 DIAGNOSIS — O99513 Diseases of the respiratory system complicating pregnancy, third trimester: Secondary | ICD-10-CM

## 2019-04-04 DIAGNOSIS — O0993 Supervision of high risk pregnancy, unspecified, third trimester: Secondary | ICD-10-CM

## 2019-04-04 DIAGNOSIS — O26613 Liver and biliary tract disorders in pregnancy, third trimester: Secondary | ICD-10-CM

## 2019-04-04 DIAGNOSIS — K831 Obstruction of bile duct: Secondary | ICD-10-CM

## 2019-04-04 DIAGNOSIS — O09213 Supervision of pregnancy with history of pre-term labor, third trimester: Secondary | ICD-10-CM

## 2019-04-04 DIAGNOSIS — O09219 Supervision of pregnancy with history of pre-term labor, unspecified trimester: Secondary | ICD-10-CM

## 2019-04-04 DIAGNOSIS — Z3A34 34 weeks gestation of pregnancy: Secondary | ICD-10-CM

## 2019-04-04 DIAGNOSIS — O4403 Placenta previa specified as without hemorrhage, third trimester: Secondary | ICD-10-CM

## 2019-04-04 DIAGNOSIS — O0990 Supervision of high risk pregnancy, unspecified, unspecified trimester: Secondary | ICD-10-CM

## 2019-04-04 NOTE — Progress Notes (Signed)
   PRENATAL VISIT NOTE  Subjective:  Dawn Haney is a 34 y.o. [id number] at [redacted]w[redacted]d being seen today for ongoing prenatal care.  She is currently monitored for the following issues for this high-risk pregnancy and has Supervision of high risk pregnancy, antepartum; Previous preterm delivery, antepartum; Cholestasis of pregnancy, antepartum; Alpha thalassemia silent carrier; Language barrier; Placenta previa; Asthma affecting pregnancy in third trimester; Acute respiratory failure with hypoxia (HCC); Hypokalemia; and Bronchitis with asthma, acute on their problem list.  Patient reports no complaints. Feeling very heavy.  Contractions: Not present. Vag. Bleeding: None.  Movement: Present. Denies leaking of fluid.   The following portions of the patient's history were reviewed and updated as appropriate: allergies, current medications, past family history, past medical history, past social history, past surgical history and problem list.   Objective:   Vitals:   04/04/19 1341  BP: 104/68  Pulse: 96  Weight: 138 lb (62.6 kg)    Fetal Status: Fetal Heart Rate (bpm): 142   Movement: Present     General:  Alert, oriented and cooperative. Patient is in no acute distress.  Skin: Skin is warm and dry. No rash noted.   Cardiovascular: Normal heart rate noted  Respiratory: Normal respiratory effort, no problems with respiration noted  Abdomen: Soft, gravid, appropriate for gestational age.  Pain/Pressure: Absent     Pelvic: Cervical exam deferred        Extremities: Normal range of motion.  Edema: None  Mental Status: Normal mood and affect. Normal behavior. Normal judgment and thought content.   Assessment and Plan:  Pregnancy: E3P2951 at [redacted]w[redacted]d  1. Supervision of high risk pregnancy, antepartum Reviewed recommendation for induction at 37 weeks, risks/benefits, pt verbalizes understanding and is in agreement with plan  2. Previous preterm delivery, antepartum  3. Placenta previa in third  trimester resolved  4. Cholestasis of pregnancy, antepartum Delivery at 37 weeks  5. Asthma affecting pregnancy in third trimester doing well No issues  Preterm labor symptoms and general obstetric precautions including but not limited to vaginal bleeding, contractions, leaking of fluid and fetal movement were reviewed in detail with the patient. Please refer to After Visit Summary for other counseling recommendations.   Return in about 1 week (around 04/11/2019) for high OB, in person.  Future Appointments  Date Time Provider Department Center  04/08/2019  1:30 PM WH-MFC NURSE WH-MFC MFC-US  04/08/2019  1:30 PM WH-MFC [place] 1 WH-MFCUS MFC-US  04/15/2019  2:15 PM WH-MFC NURSE WH-MFC MFC-US  04/15/2019  2:15 PM WH-MFC [place] 4 WH-MFCUS MFC-US    Conan Bowens, MD

## 2019-04-04 NOTE — Patient Instructions (Signed)
AREA PEDIATRIC/FAMILY PRACTICE PHYSICIANS  [place] ([address]) . [facility] o Chambliss, MD; Eniola, MD; Hale, MD; Hensel, MD; McDiarmid, MD; McIntyer, MD; Neal, MD; Walden, MD o [address] [place], [place] [address] o [phone] o Mon-Fri 8:30-12:30, 1:30-5:00 o Providers come to see babies at [facility] o Accepting Medicaid . [facility] o Limited providers who accept newborns: Koirala, MD; Morrow, MD; Wolters, MD o [address], [place], [place] [address] o [phone] o Mon-Fri 8:00-5:30 o Babies seen by providers at [facility] o Does NOT accept Medicaid o Please call early in hospitalization for appointment (limited availability)  . [facility] o Mulberry, MD o [address] [place], [place] [address] o [phone] o Mon, Tue, Thur, Fri 8:30-5:00, Wed 10:00-7:00 (closed 1-2pm) o Babies seen by [facility] providers o Accepting Medicaid . [facility] o Rubin, MD o [address], [place], [place] [address] o [phone] o Mon-Fri 8:30-5:00, Sat 8:30-12:00 o Provider comes to see babies at [facility] o Accepting Medicaid o Must have been referred from current patients or contacted office prior to delivery . [facility] ([facility]) o Brown, MD; Chandler, MD; Ettefagh, MD; Grant, MD; Lester, MD; McCormick, MD; McQueen, MD; Prose, MD; Simha, MD; Stanley, MD; Stryffeler, NP; Tebben, NP o [address], [place], [place] [address] o [phone] o Mon, Tue, Thur, Fri 8:30-5:30, Wed 9:30-5:30, Sat 8:30-12:30 o Babies seen by [facility] providers o Accepting Medicaid o Only accepting infants of first-time parents or siblings of current patients o Hospital discharge coordinator will make follow-up appointment . Jack Amos o [address],  [place], [place]  [address] o [phone]   Fax - [phone] . [facility] o [address], [place], [place]  [address] o Phone - [phone]   Fax - [phone] . Shilpa Gosrani o [address], [place], [place]  [address] o [phone]  [place] ([address]) . [facility] o Bates, MD; [place], MD; Cooper, Cox, MD; MD; Onur Mori, MD; Dovico, MD; Ettefaugh, MD; Little, MD; Lowe, MD; Keiffer, MD; Melvin, MD; Sumner, MD; Williams, MD o [address], [place], [place] [address] o [phone] o Mon-Fri 8:30-5:00 (extended evenings Mon-Thur as needed), Sat-Sun 10:00-1:00 o Providers come to see babies at [facility] o Accepting Medicaid for families of first-time babies and families with all children in the household age 3 and under. Must register with office prior to making appointment (M-F only). . [facility] o Henson, NP; Knapp, MD; Lalonde, MD; Tysinger, PA o [address] [place], [place] [address] o [phone] o Mon-Fri 8:00-5:00 o Babies seen by providers at [facility] o Does NOT accept Medicaid/Commercial Insurance Only . [facility] ([facility])  o Artis, MD; Barnes, MD; Bratton, MD; Coccaro, MD; Lockett Gardner, MD; Kramer, MD; Marshall, MD; Netherton, MD; Poleto, MD; Skinner, MD o [address] [place], [place] [address] o [phone] o Mon-Fri 8:30-5:30, Sat (Oct.-Mar.) 9:00-1:00 o Babies seen by providers at [facility] o Accepting Medicaid  [place] ([address]) . [facility] o Reid, MD; Warner, MD o [address], , [place] [address] o [phone] o Mon-Fri 8:30-5:00, Sat 8:30-12:00 o Providers come to see babies at [facility] o Does NOT accept Medicaid . [facility] o Becker, PA; Hagler, MD; Scifres, PA; Sun, MD; Swayne, MD o [address],  [place], [place] [address] o [phone] o Mon-Fri 8:00-5:00 o Babies seen by providers at [facility] o Does NOT accept Medicaid o Only accepting babies of parents who are patients o Please call early in hospitalization for appointment (limited availability) . [facility] o Clark, MD; Frye, MD; Kelleher, MD; Mack, NP; Miller, MD; O'Keller, MD; Patterson, NP; Pudlo, MD; Puzio, MD; Thomas, MD; Tucker, MD; Twiselton, MD o [address], [place], [place] [address] o [phone] o Mon-Fri 8:00-5:00, Sat 9:00-12:00 o Providers come to see babies at [facility] o Does NOT accept Medicaid  [place] ([address]) . [facility] o Limited providers accepting new patients: Brake, NP; Wharton, PA o [address], [place], [place] [address] o [phone] o Mon-Fri 8:00-5:00 o Babies seen by providers at [facility] o Does NOT accept Medicaid o Only accepting babies of parents who are patients o Please call early in hospitalization for appointment (limited availability) . [facility] o Gay, MD; Quinlan, MD o [address] [place], [place] [address] o [phone] (press [phone] to schedule appointment) o Mon-Fri 8:00-5:00 o Providers come to see babies at [facility] o Does NOT accept Medicaid . [facility] o Mazer, MD o [address] [place], [place] [address] o [phone] o Mon-Fri 8:30-5:00 (lunch 12:30-1:00), extended hours by appointment only Wed 5:00-6:30 o Babies seen by [facility] providers o Accepting Medicaid . [facility] o Banks, MD; Jordan, MD; Koberlein, MD o [address], [place], [place] [address] o [phone] o Mon-Fri 8:00-5:00 o Babies seen by [facility] providers o Does NOT accept Medicaid . [facility] o Parker, MD; Hunter, MD; Wallace, DO o [address] [place], [place]  [address] o [phone] o Mon-Fri 8:00-5:00 o Babies seen by [facility] providers o Does NOT accept Medicaid . [facility] o Brandon, PA; Brecken, PA; Christy, NP; Dees, MD; DeClaire, MD; DeWeese, MD; Hansen, NP; Mills, NP; Parrish, NP; Smoot, NP; Summer, MD; Vapne, MD o [address] [place], [place] [address] o [phone] o Mon-Fri 8:30-5:00, Sat 10:00-1:00 o Providers come to see babies at [facility] o Does NOT accept Medicaid o Free prenatal information session Tuesdays at 4:45pm . [facility] o Bouska, MD; Gordon, PA; Jeffery, PA; Weber, PA o [address] [place] [address] o [phone] o Mon-Fri 7:30-5:30 o Babies seen by [facility] providers . [facility] o [address], [place], [place]  [address] o [phone]   Fax - [phone]  [place] ([address]) . [facility] o [place], MD o [address] [place], [place] [address] o [phone] o Mon-Thur 8:00-6:00 o Providers come to see babies at [facility] o Accepting Medicaid . [facility] o Anderson, NP; Badger, MD; Beal, PA; Spencer, PA o [address] [place],  [address] o [phone] o Mon-Thur 7:30-7:30, Fri 7:30-4:30 o Babies seen by [facility] providers o Accepting Medicaid . [facility] o Agbuya, MD; Klett, NP; Romgoolam, MD o [address], [place],  [address] o [phone] o Mon-Fri 8:30-5:00, Sat 8:30-12:00 o Providers come to see babies at [facility] o Accepting Medicaid o Must have "Meet & Greet" appointment at office prior to delivery . [facility] ([facility] o McCord,   MD; Wallace, MD; Wood, MD o [address], [place], [place] [address] o [phone] o Mon-Wed 8:00-6:00, Thur-Fri 8:00-5:00, Sat 9:00-12:00 o Providers come to  see babies at [facility] o Does NOT accept Medicaid o Only accepting siblings of current patients . [facility]  o [address], [place], [place]  [address] o [phone]   Fax - [phone] . [facility] o [address], [place], [place]  [address] o [phone]   Fax - [phone]  [place] ([address]) . [facility] o Cirigliano, DO; Matthews, DO o [address] [place], [place] [address] o [phone] o Mon-Fri 7:00-5:00 o Babies seen by [facility] providers o Does NOT accept Medicaid . [facility] o Briscoe, MD; Howley, PA; Moreira, PA o [address], [place], [place] [address] o [phone] o Mon-Fri 8:00-5:00 o Babies seen by [facility] providers o Accepting Medicaid . [facility] o Boyd, MD; Church, PA; Jones, NP; Osborn, PA o [address], [place], [place] [address] o [phone] o Mon-Fri 8:00-5:00 o Babies seen by providers at [facility] o Accepting Medicaid  [place] ([address]) . [facility] [place] o Wendling, DO o [address] [place], [place] [address] o [phone] o Mon-Fri 8:00-5:00 o Babies seen by [facility] providers o Does NOT accept Medicaid o Limited availability, please call early in hospitalization to schedule follow-up . [facility] o Calderon, PA; Cummings, MD; Dillard, MD; Martin, PA; Olson, MD; VanDeven, PA o [address], [place], [place] [address] o [phone] o Mon-Fri 8:30-5:00, Sat 9:00-12:00 o Babies seen by providers at [facility] o Accepting Medicaid o Please register online then schedule online or call office o [email or web address] . [facility] ([facility] o Hunter, NP; Kumar, MD; Martin Rogers, PA o [address], [place], [place] [address] o [phone] o Mon-Fri 8:00-5:00 o Babies seen by providers at [facility] o Accepting Medicaid . [facility] ([facility]) o Idabel, MD; Kristi Fleenor, NP; West, MD o [address], [place], [place] [address] o [phone] o Mon-Fri 8:00-5:30, Sat&Sun by appointment (phones open at 8:30) o Babies seen by [facility] providers o Accepting Medicaid o Must be a first-time baby or sibling of current patient . [facility] [place]  o [address], [place], [place]  [address] o [phone]   Fax - [phone]  [place] ([address]) . [facility] o Brown, PA; Cowen, PA; Rice, MD; Helton, PA; Spry, MD o [address] [place], [place] [address] o [phone] o Mon-Thur 8:00-7:00, Fri 8:00-5:00, Sat 8:00-12:00, Sun 9:00-12:00 o Babies seen by [facility] providers o Accepting Medicaid . [facility] o Coe-Goins, MD; Marshall, MD; Pierre-Louis, MD o [address] [place] [address], [place], [place] [address] o [phone] o Mon-Thur 8:00-5:00 o Babies seen by providers at [facility] o Accepting Medicaid . [facility] o Bratton, MD; Coe-Goins, MD; Hayes, MD; Lewis, MD; List, MD; Lott, MD; Marshall, MD; Moran, MD; O'Neal, MD; Pierre-Louis, MD; Pitonzo, MD; Scholer, MD; Spangle, MD o [address] [place], [place]   [address] o [phone] o Mon-Fri 8:00-5:30, Sat (Oct.-Mar.) 9:00-1:00 o Babies seen by providers at [facility] o Accepting Medicaid o Must fill out new patient packet, available online at [email or web address] . [facility] ([facility] o Friddle, NP; Harris, NP; Rasha Ibe, NP; Logan, MD;  Melvin, PA; Poth, MD; Ramadoss, MD; [place], NP o [address], [place], [place] [address] o [phone] o Mon-Thur 8:00-5:30, Fri 8:00-5:00 o Babies seen by providers at [facility] o Accepting Medicaid  [place] ([address]) . [facility] o Dixon, PA; [place], MD; Pickard, MD; Tapia, PA o [address], [place], [place] [address] o [phone] o Mon-Fri 8:00-5:00 o Babies seen by providers at [facility] o Accepting Medicaid   [place] ([address]) . [facility] [place] o Masneri, DO; Meyers, MD; Nelson, PA o [address], [place], [place] [address] o [phone] o Mon-Fri 8:00-5:00 o Babies seen by providers at [facility] o Does NOT accept Medicaid o Limited appointment availability, please call early in hospitalization  . [facility] [place] o Kunedd, DO; McGowen, MD o [address], [place], [place] [address] o [phone] o Mon-Fri 8:00-5:00 o Babies seen by [facility] providers o Does NOT accept Medicaid . [facility] [place] o Cameron, MD; MacDonald, MD; Michaels, PA; Nayak, MD o [address] [place] [address], [place], [place] [address] o [phone] o Mon-Fri 8:00-5:00 o After hours clinic ([address] [place], [place] [address]) [phone] Mon-Fri 5:00-8:00, Sat 12:00-6:00, Sun 10:00-4:00 o Babies seen by [facility] providers o Accepting Medicaid . [facility] [place] o [address], [place], [place]  [address] o [phone]   Fax - [phone]  [place] ([address]) . [facility] [place] [facility] o Andy, MD o [address], [place], [place] [address] o [phone] o Mon-Fri 8:00-5:00 o Babies seen by [facility] providers o Does NOT accept Medicaid . [facility] [place] ([facility] [place]) o Eksir, MD o [address], [place], [place]  [address] o [phone] o Mon-Thur 8:00-7:00, Fri 8:00-5:00, Sat 8:00-12:00 o Babies seen by providers at [facility] o Accepting Medicaid - but does not have vaccinations in office (must be received elsewhere) o Limited availability, please call early in hospitalization  [place] ([address]) . [facility]  o Charlene Flemming, MD o [address], [place] [address] o [phone]  Fax [phone]   

## 2019-04-08 ENCOUNTER — Encounter (HOSPITAL_COMMUNITY): Payer: Self-pay | Admitting: *Deleted

## 2019-04-08 ENCOUNTER — Ambulatory Visit (HOSPITAL_COMMUNITY): Payer: Medicaid Other | Admitting: *Deleted

## 2019-04-08 ENCOUNTER — Ambulatory Visit (HOSPITAL_COMMUNITY)
Admission: RE | Admit: 2019-04-08 | Discharge: 2019-04-08 | Disposition: A | Discharge status: Home / Self Care | Payer: Medicaid Other | Source: Ambulatory Visit | Attending: Obstetrics and Gynecology | Admitting: Obstetrics and Gynecology

## 2019-04-08 ENCOUNTER — Other Ambulatory Visit: Payer: Self-pay

## 2019-04-08 DIAGNOSIS — O26613 Liver and biliary tract disorders in pregnancy, third trimester: Secondary | ICD-10-CM | POA: Diagnosis not present

## 2019-04-08 DIAGNOSIS — Z3A35 35 weeks gestation of pregnancy: Secondary | ICD-10-CM

## 2019-04-08 DIAGNOSIS — O4403 Placenta previa specified as without hemorrhage, third trimester: Secondary | ICD-10-CM

## 2019-04-08 DIAGNOSIS — O0990 Supervision of high risk pregnancy, unspecified, unspecified trimester: Secondary | ICD-10-CM | POA: Diagnosis present

## 2019-04-08 DIAGNOSIS — O26619 Liver and biliary tract disorders in pregnancy, unspecified trimester: Secondary | ICD-10-CM | POA: Insufficient documentation

## 2019-04-08 DIAGNOSIS — D563 Thalassemia minor: Secondary | ICD-10-CM | POA: Diagnosis present

## 2019-04-08 DIAGNOSIS — O0933 Supervision of pregnancy with insufficient antenatal care, third trimester: Secondary | ICD-10-CM | POA: Diagnosis not present

## 2019-04-08 DIAGNOSIS — K831 Obstruction of bile duct: Secondary | ICD-10-CM

## 2019-04-08 DIAGNOSIS — O09219 Supervision of pregnancy with history of pre-term labor, unspecified trimester: Secondary | ICD-10-CM

## 2019-04-11 ENCOUNTER — Other Ambulatory Visit (HOSPITAL_COMMUNITY)
Admission: RE | Admit: 2019-04-11 | Discharge: 2019-04-11 | Disposition: A | Discharge status: Home / Self Care | Payer: Medicaid Other | Source: Ambulatory Visit | Attending: Obstetrics & Gynecology | Admitting: Obstetrics & Gynecology

## 2019-04-11 ENCOUNTER — Other Ambulatory Visit: Payer: Self-pay

## 2019-04-11 ENCOUNTER — Ambulatory Visit (INDEPENDENT_AMBULATORY_CARE_PROVIDER_SITE_OTHER): Payer: Medicaid Other | Admitting: Obstetrics & Gynecology

## 2019-04-11 VITALS — BP 96/66 | HR 96 | Wt 142.0 lb

## 2019-04-11 DIAGNOSIS — Z3A35 35 weeks gestation of pregnancy: Secondary | ICD-10-CM

## 2019-04-11 DIAGNOSIS — O26613 Liver and biliary tract disorders in pregnancy, third trimester: Secondary | ICD-10-CM

## 2019-04-11 DIAGNOSIS — O0990 Supervision of high risk pregnancy, unspecified, unspecified trimester: Secondary | ICD-10-CM | POA: Insufficient documentation

## 2019-04-11 DIAGNOSIS — K831 Obstruction of bile duct: Secondary | ICD-10-CM

## 2019-04-11 DIAGNOSIS — O26619 Liver and biliary tract disorders in pregnancy, unspecified trimester: Secondary | ICD-10-CM

## 2019-04-11 LAB — OB RESULTS CONSOLE GBS: GBS: NEGATIVE

## 2019-04-11 NOTE — Patient Instructions (Signed)

## 2019-04-11 NOTE — Progress Notes (Signed)
   PRENATAL VISIT NOTE  Subjective:  Dawn Haney is a 34 y.o. Y7C6237 at [redacted]w[redacted]d being seen today for ongoing prenatal care.  She is currently monitored for the following issues for this high-risk pregnancy and has Supervision of high risk pregnancy, antepartum; Previous preterm delivery, antepartum; Cholestasis of pregnancy, antepartum; Alpha thalassemia silent carrier; Language barrier; Placenta previa; Asthma affecting pregnancy in third trimester; Acute respiratory failure with hypoxia (HCC); Hypokalemia; and Bronchitis with asthma, acute on their problem list.  Patient reports no complaints.  Contractions: Not present. Vag. Bleeding: None.  Movement: Present. Denies leaking of fluid.   The following portions of the patient's history were reviewed and updated as appropriate: allergies, current medications, past family history, past medical history, past social history, past surgical history and problem list.   Objective:   Vitals:   04/11/19 1556  BP: 96/66  Pulse: 96  Weight: 142 lb (64.4 kg)    Fetal Status:     Movement: Present  Presentation: Vertex  General:  Alert, oriented and cooperative. Patient is in no acute distress.  Skin: Skin is warm and dry. No rash noted.   Cardiovascular: Normal heart rate noted  Respiratory: Normal respiratory effort, no problems with respiration noted  Abdomen: Soft, gravid, appropriate for gestational age.  Pain/Pressure: Absent     Pelvic: Cervical exam performed Dilation: Fingertip Effacement (%): 30 Station: Ballotable  Extremities: Normal range of motion.  Edema: None  Mental Status: Normal mood and affect. Normal behavior. Normal judgment and thought content.   Assessment and Plan:  Pregnancy: S2G3151 at [redacted]w[redacted]d 1. Supervision of high risk pregnancy, antepartum routine - Cervicovaginal ancillary only - Culture, beta strep (group b only)  2. Cholestasis of pregnancy, antepartum IOL 37 weeks, pt requests 04/20/19  Preterm labor  symptoms and general obstetric precautions including but not limited to vaginal bleeding, contractions, leaking of fluid and fetal movement were reviewed in detail with the patient. Please refer to After Visit Summary for other counseling recommendations.   Return if symptoms worsen or fail to improve, for postpartum.  Future Appointments  Date Time Provider Department Center  04/15/2019  2:15 PM [facility] NURSE [facility]  04/15/2019  2:15 PM [facility] [place] 4 [facility]  05/06/2019  2:00 PM Bobbitt, Heywood Iles, MD [facility] None    Scheryl Darter, MD

## 2019-04-12 ENCOUNTER — Encounter (HOSPITAL_COMMUNITY): Payer: Self-pay | Admitting: *Deleted

## 2019-04-12 ENCOUNTER — Telehealth (HOSPITAL_COMMUNITY): Payer: Self-pay | Admitting: *Deleted

## 2019-04-12 LAB — CERVICOVAGINAL ANCILLARY ONLY
Bacterial Vaginitis (gardnerella): NEGATIVE
Candida Glabrata: NEGATIVE
Candida Vaginitis: POSITIVE — AB
Chlamydia: NEGATIVE
Comment: NEGATIVE
Comment: NEGATIVE
Comment: NEGATIVE
Comment: NEGATIVE
Comment: NEGATIVE
Comment: NORMAL
Neisseria Gonorrhea: NEGATIVE
Trichomonas: NEGATIVE

## 2019-04-12 NOTE — Telephone Encounter (Signed)
Preadmission screen Interpreter number [phone] Pt is unaware of liver problems per conversation with interpreter. Attempted to explain the itching was coming from cholestasis and that was the reason for her induction.  Pt stated she understood and no questions of care plan

## 2019-04-15 ENCOUNTER — Other Ambulatory Visit: Payer: Self-pay | Admitting: Obstetrics & Gynecology

## 2019-04-15 ENCOUNTER — Other Ambulatory Visit: Payer: Self-pay

## 2019-04-15 ENCOUNTER — Ambulatory Visit (HOSPITAL_COMMUNITY)
Admission: RE | Admit: 2019-04-15 | Discharge: 2019-04-15 | Disposition: A | Discharge status: Home / Self Care | Payer: Medicaid Other | Source: Ambulatory Visit | Attending: Obstetrics and Gynecology | Admitting: Obstetrics and Gynecology

## 2019-04-15 ENCOUNTER — Encounter (HOSPITAL_COMMUNITY): Payer: Self-pay | Admitting: *Deleted

## 2019-04-15 ENCOUNTER — Ambulatory Visit (HOSPITAL_COMMUNITY): Payer: Medicaid Other | Admitting: *Deleted

## 2019-04-15 DIAGNOSIS — D563 Thalassemia minor: Secondary | ICD-10-CM | POA: Diagnosis present

## 2019-04-15 DIAGNOSIS — O0990 Supervision of high risk pregnancy, unspecified, unspecified trimester: Secondary | ICD-10-CM | POA: Insufficient documentation

## 2019-04-15 DIAGNOSIS — O4403 Placenta previa specified as without hemorrhage, third trimester: Secondary | ICD-10-CM | POA: Diagnosis not present

## 2019-04-15 DIAGNOSIS — O99891 Other specified diseases and conditions complicating pregnancy: Secondary | ICD-10-CM

## 2019-04-15 DIAGNOSIS — O09219 Supervision of pregnancy with history of pre-term labor, unspecified trimester: Secondary | ICD-10-CM

## 2019-04-15 DIAGNOSIS — O0933 Supervision of pregnancy with insufficient antenatal care, third trimester: Secondary | ICD-10-CM | POA: Diagnosis not present

## 2019-04-15 DIAGNOSIS — O26619 Liver and biliary tract disorders in pregnancy, unspecified trimester: Secondary | ICD-10-CM | POA: Diagnosis present

## 2019-04-15 DIAGNOSIS — K831 Obstruction of bile duct: Secondary | ICD-10-CM | POA: Diagnosis present

## 2019-04-15 DIAGNOSIS — Z362 Encounter for other antenatal screening follow-up: Secondary | ICD-10-CM

## 2019-04-15 DIAGNOSIS — J45909 Unspecified asthma, uncomplicated: Secondary | ICD-10-CM

## 2019-04-15 DIAGNOSIS — O26613 Liver and biliary tract disorders in pregnancy, third trimester: Secondary | ICD-10-CM | POA: Diagnosis not present

## 2019-04-15 DIAGNOSIS — Z3A36 36 weeks gestation of pregnancy: Secondary | ICD-10-CM

## 2019-04-15 LAB — CULTURE, BETA STREP (GROUP B ONLY): Strep Gp B Culture: NEGATIVE

## 2019-04-15 MED ORDER — FLUCONAZOLE 150 MG PO TABS: 150.0000 mg | ORAL_TABLET | Freq: Once | ORAL | 0 refills | Status: AC

## 2019-04-17 ENCOUNTER — Other Ambulatory Visit: Payer: Self-pay | Admitting: Advanced Practice Midwife

## 2019-04-18 ENCOUNTER — Other Ambulatory Visit: Payer: Self-pay | Admitting: Obstetrics & Gynecology

## 2019-04-18 ENCOUNTER — Other Ambulatory Visit (HOSPITAL_COMMUNITY): Admission: RE | Admit: 2019-04-18 | Payer: Medicaid Other | Source: Ambulatory Visit

## 2019-04-18 NOTE — Progress Notes (Signed)
IOL orders 

## 2019-04-19 ENCOUNTER — Other Ambulatory Visit: Payer: Self-pay | Admitting: Advanced Practice Midwife

## 2019-04-20 ENCOUNTER — Other Ambulatory Visit: Payer: Self-pay

## 2019-04-20 ENCOUNTER — Encounter (HOSPITAL_COMMUNITY): Payer: Self-pay | Admitting: Obstetrics & Gynecology

## 2019-04-20 ENCOUNTER — Inpatient Hospital Stay (HOSPITAL_COMMUNITY)
Admission: AD | Admit: 2019-04-20 | Discharge: 2019-04-22 | DRG: 805 | Disposition: A | Discharge status: Home / Self Care | Payer: Medicaid Other | Attending: Obstetrics & Gynecology | Admitting: Obstetrics & Gynecology

## 2019-04-20 ENCOUNTER — Inpatient Hospital Stay (HOSPITAL_COMMUNITY): Payer: Medicaid Other

## 2019-04-20 DIAGNOSIS — Z30017 Encounter for initial prescription of implantable subdermal contraceptive: Secondary | ICD-10-CM | POA: Diagnosis not present

## 2019-04-20 DIAGNOSIS — Z20822 Contact with and (suspected) exposure to covid-19: Secondary | ICD-10-CM | POA: Diagnosis present

## 2019-04-20 DIAGNOSIS — D563 Thalassemia minor: Secondary | ICD-10-CM

## 2019-04-20 DIAGNOSIS — Z3A37 37 weeks gestation of pregnancy: Secondary | ICD-10-CM | POA: Diagnosis not present

## 2019-04-20 DIAGNOSIS — K831 Obstruction of bile duct: Secondary | ICD-10-CM | POA: Diagnosis present

## 2019-04-20 DIAGNOSIS — Z880 Allergy status to penicillin: Secondary | ICD-10-CM

## 2019-04-20 DIAGNOSIS — Z789 Other specified health status: Secondary | ICD-10-CM | POA: Diagnosis present

## 2019-04-20 DIAGNOSIS — O0990 Supervision of high risk pregnancy, unspecified, unspecified trimester: Secondary | ICD-10-CM

## 2019-04-20 DIAGNOSIS — O26649 Intrahepatic cholestasis of pregnancy, unspecified trimester: Secondary | ICD-10-CM

## 2019-04-20 DIAGNOSIS — O2662 Liver and biliary tract disorders in childbirth: Secondary | ICD-10-CM | POA: Diagnosis present

## 2019-04-20 DIAGNOSIS — O26619 Liver and biliary tract disorders in pregnancy, unspecified trimester: Secondary | ICD-10-CM

## 2019-04-20 DIAGNOSIS — O09219 Supervision of pregnancy with history of pre-term labor, unspecified trimester: Secondary | ICD-10-CM

## 2019-04-20 HISTORY — DX: Obstruction of bile duct: K83.1

## 2019-04-20 HISTORY — DX: Intrahepatic cholestasis of pregnancy, unspecified trimester: O26.649

## 2019-04-20 LAB — CBC
HCT: 36.4 % (ref 36.0–46.0)
Hemoglobin: 11.2 g/dL — ABNORMAL LOW (ref 12.0–15.0)
MCH: 24.5 pg — ABNORMAL LOW (ref 26.0–34.0)
MCHC: 30.8 g/dL (ref 30.0–36.0)
MCV: 79.6 fL — ABNORMAL LOW (ref 80.0–100.0)
Platelets: 330 10*3/uL (ref 150–400)
RBC: 4.57 MIL/uL (ref 3.87–5.11)
RDW: 16 % — ABNORMAL HIGH (ref 11.5–15.5)
WBC: 8.6 10*3/uL (ref 4.0–10.5)
nRBC: 0 % (ref 0.0–0.2)

## 2019-04-20 LAB — URINALYSIS, ROUTINE W REFLEX MICROSCOPIC
Bilirubin Urine: NEGATIVE
Glucose, UA: NEGATIVE mg/dL
Hgb urine dipstick: NEGATIVE
Ketones, ur: NEGATIVE mg/dL
Nitrite: NEGATIVE
Protein, ur: NEGATIVE mg/dL
Specific Gravity, Urine: 1.012 (ref 1.005–1.030)
pH: 7 (ref 5.0–8.0)

## 2019-04-20 LAB — SARS CORONAVIRUS 2 (TAT 6-24 HRS): SARS Coronavirus 2: NEGATIVE

## 2019-04-20 LAB — TYPE AND SCREEN
ABO/RH(D): B POS
Antibody Screen: NEGATIVE

## 2019-04-20 LAB — ABO/RH: ABO/RH(D): B POS

## 2019-04-20 LAB — SYPHILIS: RPR W/REFLEX TO RPR TITER AND TREPONEMAL ANTIBODIES, TRADITIONAL SCREENING AND DIAGNOSIS ALGORITHM: RPR Ser Ql: NONREACTIVE

## 2019-04-20 MED ORDER — OXYTOCIN BOLUS FROM INFUSION
500.0000 mL | Freq: Once | INTRAVENOUS | Status: AC
  Administered 2019-04-21: 500 mL via INTRAVENOUS

## 2019-04-20 MED ORDER — PHENYLEPHRINE 40 MCG/ML (10ML) SYRINGE FOR IV PUSH (FOR BLOOD PRESSURE SUPPORT)
80.0000 ug | PREFILLED_SYRINGE | INTRAVENOUS | Status: DC | PRN
  Filled 2019-04-20 (×3): qty 10

## 2019-04-20 MED ORDER — MISOPROSTOL 50MCG HALF TABLET
ORAL_TABLET | ORAL | Status: AC
  Filled 2019-04-20: qty 1

## 2019-04-20 MED ORDER — OXYTOCIN 40 UNITS IN NORMAL SALINE INFUSION - SIMPLE MED
1.0000 m[IU]/min | INTRAVENOUS | Status: DC
  Administered 2019-04-20: 2 m[IU]/min via INTRAVENOUS
  Filled 2019-04-20: qty 1000

## 2019-04-20 MED ORDER — LACTATED RINGERS IV SOLN: INTRAVENOUS | Status: DC

## 2019-04-20 MED ORDER — OXYCODONE-ACETAMINOPHEN 5-325 MG PO TABS: 1.0000 | ORAL_TABLET | ORAL | Status: DC | PRN

## 2019-04-20 MED ORDER — DIPHENHYDRAMINE HCL 50 MG/ML IJ SOLN: 12.5000 mg | INTRAMUSCULAR | Status: DC | PRN

## 2019-04-20 MED ORDER — LIDOCAINE HCL (PF) 1 % IJ SOLN: 30.0000 mL | INTRAMUSCULAR | Status: DC | PRN

## 2019-04-20 MED ORDER — MISOPROSTOL 50MCG HALF TABLET
50.0000 ug | ORAL_TABLET | ORAL | Status: DC
  Administered 2019-04-20: 50 ug via BUCCAL

## 2019-04-20 MED ORDER — OXYTOCIN 40 UNITS IN NORMAL SALINE INFUSION - SIMPLE MED: 2.5000 [IU]/h | INTRAVENOUS | Status: DC

## 2019-04-20 MED ORDER — OXYCODONE-ACETAMINOPHEN 5-325 MG PO TABS: 2.0000 | ORAL_TABLET | ORAL | Status: DC | PRN

## 2019-04-20 MED ORDER — EPHEDRINE 5 MG/ML INJ: 10.0000 mg | INTRAVENOUS | Status: DC | PRN

## 2019-04-20 MED ORDER — TERBUTALINE SULFATE 1 MG/ML IJ SOLN: 0.2500 mg | Freq: Once | INTRAMUSCULAR | Status: DC | PRN

## 2019-04-20 MED ORDER — LACTATED RINGERS IV SOLN
500.0000 mL | INTRAVENOUS | Status: DC | PRN
  Administered 2019-04-21 (×3): 500 mL via INTRAVENOUS

## 2019-04-20 MED ORDER — SOD CITRATE-CITRIC ACID 500-334 MG/5ML PO SOLN: 30.0000 mL | ORAL | Status: DC | PRN

## 2019-04-20 MED ORDER — FENTANYL-BUPIVACAINE-NACL 0.5-0.125-0.9 MG/250ML-% EP SOLN
12.0000 mL/h | EPIDURAL | Status: DC | PRN
  Filled 2019-04-20: qty 250

## 2019-04-20 MED ORDER — PHENYLEPHRINE 40 MCG/ML (10ML) SYRINGE FOR IV PUSH (FOR BLOOD PRESSURE SUPPORT)
80.0000 ug | PREFILLED_SYRINGE | INTRAVENOUS | Status: DC | PRN
  Administered 2019-04-21: 80 ug via INTRAVENOUS

## 2019-04-20 MED ORDER — LACTATED RINGERS IV SOLN: 500.0000 mL | Freq: Once | INTRAVENOUS | Status: DC

## 2019-04-20 MED ORDER — ACETAMINOPHEN 325 MG PO TABS: 650.0000 mg | ORAL_TABLET | ORAL | Status: DC | PRN

## 2019-04-20 MED ORDER — ONDANSETRON HCL 4 MG/2ML IJ SOLN
4.0000 mg | Freq: Four times a day (QID) | INTRAMUSCULAR | Status: DC | PRN
  Administered 2019-04-20 – 2019-04-21 (×2): 4 mg via INTRAVENOUS
  Filled 2019-04-20 (×2): qty 2

## 2019-04-20 MED ORDER — FENTANYL CITRATE (PF) 100 MCG/2ML IJ SOLN
100.0000 ug | INTRAMUSCULAR | Status: DC | PRN
  Administered 2019-04-20 – 2019-04-21 (×3): 100 ug via INTRAVENOUS
  Filled 2019-04-20 (×3): qty 2

## 2019-04-20 NOTE — Progress Notes (Signed)
Labor Progress Note Dawn Haney is a 34 y.o. U5H4604 at [redacted]w[redacted]d presented for IOL for cholestasis  S:  Patient comfortable  O:  BP 94/69   Pulse 74   Temp 98 F (36.7 C) (Oral)   Resp 16   Ht 5' (1.524 m)   Wt 63.5 kg   LMP 08/03/2018   SpO2 98%   BMI 27.32 kg/m   Fetal Tracing:  Baseline: 140 Variability: moderate Accels: 15x15 Decels: none  Toco: 1-4   CVE: Dilation: 1.5 Effacement (%): 60 Station: Ballotable Presentation: Vertex Exam by:: Rayfield Citizen, CNM    A&P: 34 y.o. [id number] [redacted]w[redacted]d IOL cholestasis #Labor: Progressing well. Discussed with patient placement of foley balloon for cervical ripening. Risks and benefits reviewed. Patient agreeable to plan of care. Foley balloon inserted without difficulty and inflated with 60cc sterile water. Patient tolerated procedure well.  Patient contracting too much for cytotec at this time. Will hold and give if contractions space out #Pain: per patient request #FWB: Cat 1 #GBS negative  Rolm Bookbinder, CNM 4:19 PM

## 2019-04-20 NOTE — Progress Notes (Signed)
Labor Progress Note Dawn Haney is a 34 y.o. W0V7944 at [redacted]w[redacted]d presented for IOL for cholestasis.  S: Foley out.   O:  BP 105/64   Pulse 70   Temp 98.3 F (36.8 C) (Oral)   Resp 15   Ht 5' (1.524 m)   Wt 63.5 kg   LMP 08/03/2018   SpO2 95%   BMI 27.32 kg/m  EFM: 130, moderate variability, pos accels, no decels, reactive TOCO: q74m  CVE: Dilation: 4.5 Effacement (%): 50 Cervical Position: Posterior Station: Ballotable, -3 Presentation: Vertex Exam by:: Wilfred Lacy RN   A&P: 34 y.o. [id number] [redacted]w[redacted]d [name].  #Labor: Progressing well. S/p Cytotec x1 and Foley bulb. Pit started at 2020. AROM as indicated. Anticipate SVD. #Pain: per patient request #FWB: Cat I #GBS negative  Joselyn Arrow, MD 8:55 PM

## 2019-04-20 NOTE — H&P (Signed)
OBSTETRIC ADMISSION HISTORY AND PHYSICAL  Dawn Haney is a 34 y.o. female G3P0202 with IUP at 72w1dby LMP presenting for IOL for cholestasis. She reports +FMs, No LOF, no VB, no blurry vision, headaches or peripheral edema, and RUQ pain.  She plans on Breast and bottle feeding. She request Nexplanon for birth control. [facility] [place] [facility] --->  Estimated Date of Delivery: 05/10/19  Sono:    @[redacted]w[redacted]d , CWD, normal anatomy, cephalic presentation, [id number] 79% EFW   Nursing Staff Provider  Office Location  [facility] Dating   LMP  Language    Karen/English  Anatomy [place]   Flu Vaccine   Given 04/11/19 Genetic Screen  NIPS: low risks   AFP:   First Screen:  Quad:    TDaP vaccine   Declined Hgb A1C or  GTT Early  Third trimester: normal 2hr GTT   Rhogam     LAB RESULTS   Feeding Plan  undecided Blood Type B/Positive/-- (11/09 1005)   Contraception  Nexplanon Antibody Negative (11/09 1005)  Circumcision  no Rubella 1.30 (11/09 1005)  Pediatrician   undecided RPR Non Reactive (12/07 0955)   Support Person  Husband HBsAg Negative (11/09 1005)   Prenatal Classes  HIV Non Reactive (12/07 0955)  BTL Consent  GBS  (For PCN allergy, check sensitivities)   VBAC Consent  Pap  negative 01/14/19    Hgb Electro    BP Cuff  ordered 02/11/19 CF     SMA     Waterbirth  [ ]  Class [ ]  Consent [ ]  CNM visit    Prenatal History/Complications:  Past Medical History: Past Medical History:  Diagnosis Date   Asthma    Bronchitis with asthma, acute 03/08/2019   Cholestasis during pregnancy     Past Surgical History: Past Surgical History:  Procedure Laterality Date   NO PAST SURGERIES      Obstetrical History: OB History     Gravida  3   Para  2   Term  0   Preterm  2   AB  0   Living  2      SAB  0   TAB  0   Ectopic  0   Multiple  0   Live Births  2           Social History Social History   Socioeconomic History   Marital  status: Married    Spouse name: Not on file   Number of children: Not on file   Years of education: Not on file   Highest education level: Not on file  Occupational History   Not on file  Tobacco Use   Smoking status: Never Smoker   Smokeless tobacco: Never Used  Substance and Sexual Activity   Alcohol use: No   Drug use: No   Sexual activity: Not Currently    Birth control/protection: None  Other Topics Concern   Not on file  Social History Narrative   Not on file   Social Determinants of Health   Financial Resource Strain:    Difficulty of Paying Living Expenses: Not on file  Food Insecurity:    Worried About REstate manager/land agentof Food in the Last Year: Not on file   [employer] World[employer]of Food in the Last Year: Not on file  Transportation Needs:    Lack of Transportation (Medical): Not on file   Lack of Transportation (Non-Medical): Not on file  Physical Activity:    Days of Exercise per Week: Not on file   Minutes of Exercise per Session: Not on file  Stress:    Feeling of Stress : Not on file  Social Connections:    Frequency of Communication with Friends and Family: Not on file   Frequency of Social Gatherings with Friends and Family: Not on file   Attends Religious Services: Not on file   Active Member of Clubs or Organizations: Not on file   Attends Archivist Meetings: Not on file   Marital Status: Not on file    Family History: History reviewed. No pertinent family history.  Allergies: Allergies  Allergen Reactions   Shrimp [Shellfish Allergy] Itching   Tylenol [Acetaminophen] Rash    Medications Prior to Admission  Medication Sig Dispense Refill Last Dose   albuterol (VENTOLIN HFA) 108 (90 Base) MCG/ACT inhaler Inhale 1-2 puffs into the lungs every 6 (six) hours as needed for wheezing or shortness of breath. (Patient not taking: Reported on 03/25/2019) 18 g 1    Blood Pressure Monitoring (BLOOD PRESSURE KIT) DEVI 1 kit by Does not apply route once a week.  Check BP regularly and record readings into the Babyscripts App.  Large Cuff.  Dx O90.0 1 each 0    famotidine (PEPCID) 20 MG tablet Take 1 tablet (20 mg total) by mouth daily. (Patient not taking: Reported on 03/25/2019) 60 tablet 3    predniSONE (STERAPRED UNI-PAK 21 TAB) 10 MG (21) TBPK tablet Per package insert (Patient not taking: Reported on 04/15/2019) 1 each 0    Prenatal Vit-Fe Fumarate-FA (MULTIVITAMIN-PRENATAL) 27-0.8 MG TABS tablet Take 1 tablet by mouth daily at 12 noon.      ursodiol (ACTIGALL) 500 MG tablet Take 1 tablet (500 mg total) by mouth 2 (two) times daily. (Patient not taking: Reported on 04/15/2019) 60 tablet 3      Review of Systems   All systems reviewed and negative except as stated in HPI  Blood pressure 100/75, pulse 79, resp. rate 18, height 5' (1.524 m), weight 139 lb 14.4 oz (63.5 kg), last menstrual period 08/03/2018, SpO2 98 %. General appearance: alert, cooperative and no distress Lungs: clear to auscultation bilaterally Heart: regular rate and rhythm Abdomen: soft, non-tender; bowel sounds normal Pelvic: n/a Extremities: Homans sign is negative, no sign of DVT DTR's +2 Presentation: cephalic Fetal monitoringBaseline: 130 bpm, Variability: Good {> 6 bpm), Accelerations: Reactive and Decelerations: Absent Uterine activityNone Dilation: 1 Effacement (%): Thick Station: Ballotable Exam by:: Sharyn Lull, RN & Janett Billow, RN    Prenatal labs: ABO, Rh: B/Positive/-- (11/09 1005) Antibody: Negative (11/09 1005) Rubella: 1.30 (11/09 1005) RPR: Non Reactive (12/07 0955)  HBsAg: Negative (11/09 1005)  HIV: Non Reactive (12/07 0955)  GBS: Negative/-- (02/04 0414)   Prenatal Transfer Tool  Maternal Diabetes: No Genetic Screening: Normal Maternal Ultrasounds/Referrals: Normal Fetal Ultrasounds or other Referrals:  Referred to Materal Fetal Medicine  Maternal Substance Abuse:  No Significant Maternal Medications:  None Significant Maternal Lab Results: Group B  Strep negative  No results found for this or any previous visit (from the past 24 hour(s)).  Patient Active Problem List   Diagnosis Date Noted   Cholestasis of pregnancy 04/20/2019   Bronchitis with asthma, acute 03/08/2019   Placenta previa 03/07/2019   Asthma affecting pregnancy in third trimester 03/07/2019   Acute respiratory failure with hypoxia ([place]) 03/07/2019   Hypokalemia 03/07/2019   Language barrier 02/11/2019   Alpha thalassemia silent carrier 02/07/2019  Cholestasis of pregnancy, antepartum 01/17/2019   Previous preterm delivery, antepartum 01/14/2019   Supervision of high risk pregnancy, antepartum 01/08/2019    Assessment/Plan:  Dawn Haney is a 34 y.o. K2H0623 at 6w1dhere for IOL cholestasis  #Labor:cytotec and FB when able, vertex confirmed by bedside u/s #Pain:  per patient request #FWB: Cat 1 #ID:  GBS neg #MOF: Both #MOC: Nexplanon #Circ:  [place] CNM  04/20/2019, 10:40 AM

## 2019-04-21 ENCOUNTER — Inpatient Hospital Stay (HOSPITAL_COMMUNITY): Payer: Medicaid Other | Admitting: Anesthesiology

## 2019-04-21 ENCOUNTER — Encounter (HOSPITAL_COMMUNITY): Payer: Self-pay | Admitting: Obstetrics & Gynecology

## 2019-04-21 DIAGNOSIS — Z3A37 37 weeks gestation of pregnancy: Secondary | ICD-10-CM

## 2019-04-21 DIAGNOSIS — K831 Obstruction of bile duct: Secondary | ICD-10-CM

## 2019-04-21 DIAGNOSIS — O2662 Liver and biliary tract disorders in childbirth: Secondary | ICD-10-CM

## 2019-04-21 LAB — COMPREHENSIVE METABOLIC PANEL
ALT: 59 U/L — ABNORMAL HIGH (ref 0–44)
AST: 46 U/L — ABNORMAL HIGH (ref 15–41)
Albumin: 1.7 g/dL — ABNORMAL LOW (ref 3.5–5.0)
Alkaline Phosphatase: 161 U/L — ABNORMAL HIGH (ref 38–126)
Anion gap: 12 (ref 5–15)
BUN: 7 mg/dL (ref 6–20)
CO2: 18 mmol/L — ABNORMAL LOW (ref 22–32)
Calcium: 8.2 mg/dL — ABNORMAL LOW (ref 8.9–10.3)
Chloride: 108 mmol/L (ref 98–111)
Creatinine, Ser: 0.78 mg/dL (ref 0.44–1.00)
GFR calc Af Amer: >60 mL/min (ref >60)
GFR calc non Af Amer: >60 mL/min (ref >60)
Glucose, Bld: 170 mg/dL — ABNORMAL HIGH (ref 70–99)
Potassium: 3.4 mmol/L — ABNORMAL LOW (ref 3.5–5.1)
Sodium: 138 mmol/L (ref 135–145)
Total Bilirubin: 0.9 mg/dL (ref 0.3–1.2)
Total Protein: 4.9 g/dL — ABNORMAL LOW (ref 6.5–8.1)

## 2019-04-21 LAB — CBC
HCT: 32.7 % — ABNORMAL LOW (ref 36.0–46.0)
Hemoglobin: 9.9 g/dL — ABNORMAL LOW (ref 12.0–15.0)
MCH: 24.2 pg — ABNORMAL LOW (ref 26.0–34.0)
MCHC: 30.3 g/dL (ref 30.0–36.0)
MCV: 80 fL (ref 80.0–100.0)
Platelets: 264 10*3/uL (ref 150–400)
RBC: 4.09 MIL/uL (ref 3.87–5.11)
RDW: 16.2 % — ABNORMAL HIGH (ref 11.5–15.5)
WBC: 17.6 10*3/uL — ABNORMAL HIGH (ref 4.0–10.5)
nRBC: 0 % (ref 0.0–0.2)

## 2019-04-21 MED ORDER — TETANUS-DIPHTH-ACELL PERTUSSIS 5-2.5-18.5 LF-MCG/0.5 IM SUSP: 0.5000 mL | Freq: Once | INTRAMUSCULAR | Status: DC

## 2019-04-21 MED ORDER — CYCLOBENZAPRINE HCL 5 MG PO TABS
5.0000 mg | ORAL_TABLET | Freq: Once | ORAL | Status: AC
  Administered 2019-04-21: 5 mg via ORAL
  Filled 2019-04-21: qty 1

## 2019-04-21 MED ORDER — ACETAMINOPHEN 325 MG PO TABS: 650.0000 mg | ORAL_TABLET | ORAL | Status: DC | PRN

## 2019-04-21 MED ORDER — ONDANSETRON HCL 4 MG PO TABS: 4.0000 mg | ORAL_TABLET | ORAL | Status: DC | PRN

## 2019-04-21 MED ORDER — OXYCODONE HCL 5 MG PO TABS: 10.0000 mg | ORAL_TABLET | ORAL | Status: DC | PRN

## 2019-04-21 MED ORDER — IBUPROFEN 600 MG PO TABS
600.0000 mg | ORAL_TABLET | Freq: Four times a day (QID) | ORAL | Status: DC
  Administered 2019-04-21 – 2019-04-22 (×5): 600 mg via ORAL
  Filled 2019-04-21 (×5): qty 1

## 2019-04-21 MED ORDER — SENNOSIDES-DOCUSATE SODIUM 8.6-50 MG PO TABS
2.0000 | ORAL_TABLET | ORAL | Status: DC
  Administered 2019-04-22: 2 via ORAL
  Filled 2019-04-21: qty 2

## 2019-04-21 MED ORDER — DIPHENHYDRAMINE HCL 25 MG PO CAPS: 25.0000 mg | ORAL_CAPSULE | Freq: Four times a day (QID) | ORAL | Status: DC | PRN

## 2019-04-21 MED ORDER — PROMETHAZINE HCL 25 MG/ML IJ SOLN: 12.5000 mg | Freq: Once | INTRAMUSCULAR | Status: DC

## 2019-04-21 MED ORDER — ONDANSETRON HCL 4 MG/2ML IJ SOLN: 4.0000 mg | INTRAMUSCULAR | Status: DC | PRN

## 2019-04-21 MED ORDER — LIDOCAINE HCL (PF) 1 % IJ SOLN
INTRAMUSCULAR | Status: DC | PRN
  Administered 2019-04-21: 3 mL via EPIDURAL

## 2019-04-21 MED ORDER — ALBUTEROL SULFATE (2.5 MG/3ML) 0.083% IN NEBU
3.0000 mL | INHALATION_SOLUTION | Freq: Four times a day (QID) | RESPIRATORY_TRACT | Status: DC | PRN
  Administered 2019-04-21: 3 mL via RESPIRATORY_TRACT
  Filled 2019-04-21: qty 3

## 2019-04-21 MED ORDER — BENZOCAINE-MENTHOL 20-0.5 % EX AERO: 1.0000 "application " | INHALATION_SPRAY | CUTANEOUS | Status: DC | PRN

## 2019-04-21 MED ORDER — OXYCODONE HCL 5 MG PO TABS: 5.0000 mg | ORAL_TABLET | ORAL | Status: DC | PRN

## 2019-04-21 MED ORDER — WITCH HAZEL-GLYCERIN EX PADS: 1.0000 "application " | MEDICATED_PAD | CUTANEOUS | Status: DC | PRN

## 2019-04-21 MED ORDER — COCONUT OIL OIL: 1.0000 "application " | TOPICAL_OIL | Status: DC | PRN

## 2019-04-21 MED ORDER — SIMETHICONE 80 MG PO CHEW: 80.0000 mg | CHEWABLE_TABLET | ORAL | Status: DC | PRN

## 2019-04-21 MED ORDER — ZOLPIDEM TARTRATE 5 MG PO TABS: 5.0000 mg | ORAL_TABLET | Freq: Every evening | ORAL | Status: DC | PRN

## 2019-04-21 MED ORDER — DIBUCAINE (PERIANAL) 1 % EX OINT: 1.0000 "application " | TOPICAL_OINTMENT | CUTANEOUS | Status: DC | PRN

## 2019-04-21 MED ORDER — PRENATAL MULTIVITAMIN CH
1.0000 | ORAL_TABLET | Freq: Every day | ORAL | Status: DC
  Administered 2019-04-22: 1 via ORAL
  Filled 2019-04-21: qty 1

## 2019-04-21 MED ORDER — SODIUM CHLORIDE (PF) 0.9 % IJ SOLN
INTRAMUSCULAR | Status: DC | PRN
  Administered 2019-04-21: 12 mL/h via EPIDURAL

## 2019-04-21 NOTE — Progress Notes (Signed)
Labor Progress Note Dawn Haney is a 34 y.o. I7T2458 at [redacted]w[redacted]d presented for IOL for cholestasis.  S: Comfortable with epidural.   O:  BP (!) 93/59   Pulse 64   Temp 97.9 F (36.6 C) (Oral)   Resp 16   Ht 5' (1.524 m)   Wt 63.5 kg   LMP 08/03/2018   SpO2 98%   BMI 27.32 kg/m  EFM: 130, moderate variability, pos accels, no decels, reactive TOCO: q2-5m  Dilation: 6.5 Effacement (%): 90 Cervical Position: Posterior Station: 0 Presentation: Vertex Exam by:: Wilfred Lacy RN   A&P: 34 y.o. [id number] [redacted]w[redacted]d [name].  #Labor: S/p Cytotec x1 and Foley bulb. S/p AROM. Pit started at 2020. IUPC in place with MVU's ~ 170; cont Pit titration. Anticipate SVD. #Pain: per patient request #FWB: Cat I #GBS negative  Joselyn Arrow, MD 6:34 AM

## 2019-04-21 NOTE — Progress Notes (Signed)
CNM called to bedside due to symptomatic hypotension.   Vitals with BMI 04/21/2019 04/21/2019 04/21/2019  Height - - -  Weight - - -  BMI - - -  Systolic 85 92 98  Diastolic 51 52 55  Pulse 85 61 61   Patient reporting dizziness and HA. Fundus firm, 1 below umbilicus, scant lochia. Patient had been vomiting previous to this episode but denies any nausea after zofran IV. Likely vasovagal response due to vomiting. Will continue to monitor and treat HA appropriately.   Rolm Bookbinder, CNM 04/21/19 11:02 AM

## 2019-04-21 NOTE — Anesthesia Procedure Notes (Signed)
Epidural Patient location during procedure: OB Start time: 04/21/2019 4:35 AM End time: 04/21/2019 4:48 AM  Staffing Anesthesiologist: Trevor Iha, MD Performed: anesthesiologist   Preanesthetic Checklist Completed: patient identified, IV checked, site marked, risks and benefits discussed, surgical consent, monitors and equipment checked, pre-op evaluation and timeout performed  Epidural Patient position: sitting Prep: DuraPrep and site prepped and draped Patient monitoring: continuous pulse ox and blood pressure Approach: midline Location: L3-L4 Injection technique: LOR air  Needle:  Needle type: Tuohy  Needle gauge: 17 G Needle length: 9 cm and 9 Needle insertion depth: 5 cm cm Catheter type: closed end flexible Catheter size: 19 Gauge Catheter at skin depth: 10 cm Test dose: negative  Assessment Events: blood not aspirated, injection not painful, no injection resistance, no paresthesia and negative IV test  Additional Notes Patient identified. Risks/Benefits/Options discussed with patient including but not limited to bleeding, infection, nerve damage, paralysis, failed block, incomplete pain control, headache, blood pressure changes, nausea, vomiting, reactions to medication both or allergic, itching and postpartum back pain. Confirmed with bedside nurse the patient's most recent platelet count. Confirmed with patient that they are not currently taking any anticoagulation, have any bleeding history or any family history of bleeding disorders. Patient expressed understanding and wished to proceed. All questions were answered. Sterile technique was used throughout the entire procedure. Please see nursing notes for vital signs. Test dose was given through epidural needle and negative prior to continuing to dose epidural or start infusion. Warning signs of high block given to the patient including shortness of breath, tingling/numbness in hands, complete motor block, or any  concerning symptoms with instructions to call for help. Patient was given instructions on fall risk and not to get out of bed. All questions and concerns addressed with instructions to call with any issues. 1 Attempt (S) . Patient tolerated procedure well.

## 2019-04-21 NOTE — Discharge Summary (Signed)
Postpartum Discharge Summary     Patient Name: Dawn Haney DOB: 30-May-1985 MRN: [id number]  Date of admission: 04/20/2019 Delivering Provider: Gladys Damme   Date of discharge: 04/22/2019  Admitting diagnosis: Cholestasis of pregnancy [O26.619, K83.1] Intrauterine pregnancy: [redacted]w[redacted]d    Secondary diagnosis:  Active Problems:   Previous preterm delivery, antepartum   Language barrier   Cholestasis of pregnancy  Additional problems: None     Discharge diagnosis: Term Pregnancy Delivered                                                                                                Post partum procedures:Nexplanon insertion  Augmentation: AROM, Pitocin, Cytotec and Foley Balloon  Complications: None  Hospital course:  Induction of Labor With Vaginal Delivery   34y.o. yo G3P0202 at 339w2das admitted to the hospital 04/20/2019 for induction of labor.  Indication for induction: Cholestasis of pregnancy.  Patient had an uncomplicated labor course as follows: Membrane Rupture Time/Date: 2:26 AM ,04/21/2019   Intrapartum Procedures: Episiotomy: None [1]                                         Lacerations:  None [1]  Patient had delivery of a Viable infant.  Information for the patient's newborn:  MaSathvika, Ojoirl PoSheralyn0[[id number]]Delivery Method: Vag-Spont    04/21/2019  Details of delivery can be found in separate delivery note.  Patient had a routine postpartum course. Patient is discharged home 04/22/19. Delivery time: 9:25 AM    Magnesium Sulfate received: No BMZ received: No Rhophylac:No MMR:No Transfusion:No  Physical exam  Vitals:   04/21/19 1840 04/21/19 2355 04/22/19 0320 04/22/19 0511  BP: (!) 87/60 97/67 (!) 88/47 (!) 91/59  Pulse: 68 71 64 71  Resp: 20 16 15 16   Temp: 98.1 F (36.7 C) 98 F (36.7 C) 98.2 F (36.8 C) 98.1 F (36.7 C)  TempSrc:  Oral Oral Oral  SpO2:  100% 100% 100%  Weight:      Height:       General: alert, cooperative  and no distress Lochia: appropriate Uterine Fundus: firm Incision: N/A DVT Evaluation: No evidence of DVT seen on physical exam. Labs: Lab Results  Component Value Date   WBC 17.6 (H) 04/21/2019   HGB 9.9 (L) 04/21/2019   HCT 32.7 (L) 04/21/2019   MCV 80.0 04/21/2019   PLT 264 04/21/2019   CMP Latest Ref Rng & Units 04/21/2019  Glucose 70 - 99 mg/dL 170(H)  BUN 6 - 20 mg/dL 7  Creatinine 0.44 - 1.00 mg/dL 0.78  Sodium 135 - 145 mmol/L 138  Potassium 3.5 - 5.1 mmol/L 3.4(L)  Chloride 98 - 111 mmol/L 108  CO2 22 - 32 mmol/L 18(L)  Calcium 8.9 - 10.3 mg/dL 8.2(L)  Total Protein 6.5 - 8.1 g/dL 4.9(L)  Total Bilirubin 0.3 - 1.2 mg/dL 0.9  Alkaline Phos 38 - 126 U/L 161(H)  AST 15 - 41 U/L 46(H)  ALT 0 - 44 U/L 59(H)  Edinburgh Score: Edinburgh Postnatal Depression Scale Screening Tool 04/22/2019  I have been able to laugh and see the funny side of things. 0  I have looked forward with enjoyment to things. 0  I have blamed myself unnecessarily when things went wrong. 0  I have been anxious or worried for no good reason. 0  I have felt scared or panicky for no good reason. 0  Things have been getting on top of me. 0  I have been so unhappy that I have had difficulty sleeping. 0  I have felt sad or miserable. 0  I have been so unhappy that I have been crying. 0  The thought of harming myself has occurred to me. 0  Edinburgh Postnatal Depression Scale Total 0    Discharge instruction: per After Visit Summary and "Baby and Me Booklet".  After visit meds:  Allergies as of 04/22/2019      Reactions   Shrimp [shellfish Allergy] Itching   Tylenol [acetaminophen] Rash      Medication List    STOP taking these medications   famotidine 20 MG tablet Commonly known as: PEPCID   predniSONE 10 MG (21) Tbpk tablet Commonly known as: STERAPRED UNI-PAK 21 TAB   ursodiol 500 MG tablet Commonly known as: ACTIGALL     TAKE these medications   albuterol 108 (90 Base) MCG/ACT  inhaler Commonly known as: VENTOLIN HFA Inhale 1-2 puffs into the lungs every 6 (six) hours as needed for wheezing or shortness of breath.   Blood Pressure Kit Devi 1 kit by Does not apply route once a week. Check BP regularly and record readings into the Babyscripts App.  Large Cuff.  Dx O90.0   ibuprofen 600 MG tablet Commonly known as: ADVIL Take 1 tablet (600 mg total) by mouth every 6 (six) hours.   multivitamin-prenatal 27-0.8 MG Tabs tablet Take 1 tablet by mouth daily at 12 noon.   senna-docusate 8.6-50 MG tablet Commonly known as: Senokot-S Take 2 tablets by mouth daily. Start taking on: April 23, 2019       Diet: routine diet  Activity: Advance as tolerated. Pelvic rest for 6 weeks.   Outpatient follow up:4 weeks Follow up Appt: Future Appointments  Date Time Provider [place]  05/06/2019  2:00 PM Bobbitt, Sed[place] Muta, MD [facility] None  05/21/2019 10:00 AM Chancy Milroy, MD [facility] None   Follow up Visit:   Please schedule this patient for Postpartum visit in: 4 weeks with the following provider: Any provider Virtual For C/S patients schedule nurse incision check in weeks 2 weeks: no High risk pregnancy complicated by: cholestasis Delivery mode:  SVD Anticipated Birth Control:  Nexplanon PP Procedures needed: n/a  Schedule Integrated [place] visit: no   Newborn Data: Live born female  Birth Weight: 3080g APGAR: 51, 9  Newborn Delivery   Birth date/time: 04/21/2019 09:25:00 Delivery type: Vaginal, Spontaneous      Baby Feeding: Breast Disposition:home with mother

## 2019-04-21 NOTE — Anesthesia Postprocedure Evaluation (Signed)
Anesthesia Post Note  Patient: Dawn Haney  Procedure(s) Performed: AN AD HOC LABOR EPIDURAL     Patient location during evaluation: Mother Baby Anesthesia Type: Epidural Level of consciousness: awake and alert Pain management: pain level controlled Vital Signs Assessment: post-procedure vital signs reviewed and stable Respiratory status: spontaneous breathing, nonlabored ventilation and respiratory function stable Cardiovascular status: stable Postop Assessment: no headache, no backache, epidural receding, no apparent nausea or vomiting, patient able to bend at knees, adequate PO intake and able to ambulate Anesthetic complications: no    Last Vitals:  Vitals:   04/21/19 1407 04/21/19 1630  BP: 101/70 (!) 85/59  Pulse: (!) 50 (!) 52  Resp: 16 20  Temp: 36.8 C 36.7 C  SpO2: 100%     Last Pain:  Vitals:   04/21/19 1630  TempSrc:   PainSc: 0-No pain   Pain Goal: Patients Stated Pain Goal: 0 (04/20/19 1955)              Epidural/Spinal Function Cutaneous sensation: Normal sensation (04/21/19 1630)  Joda Braatz Hristova

## 2019-04-21 NOTE — Progress Notes (Addendum)
Labor Progress Note Dawn Haney is a 33 y.o. [id number] at [redacted]w[redacted]d presented for IOL for cholestasis.  S: Comfortable, not feeling painful ctx.    O:  BP 101/69   Pulse (!) 58   Temp 98.1 F (36.7 C) (Oral)   Resp 15   Ht 5' (1.524 m)   Wt 63.5 kg   LMP 08/03/2018   SpO2 95%   BMI 27.32 kg/m  EFM: 130, moderate variability, pos accels, no decels, reactive TOCO: q28m  CVE: Dilation: 4.5 Effacement (%): 50 Cervical Position: Posterior Station: Ballotable, -3 Presentation: Vertex Exam by:: Wilfred Lacy RN   A&P: 34 y.o. [id number] [redacted]w[redacted]d [name].  #Labor: S/p Cytotec x1 and Foley bulb. Pit started at 2020. AROM as indicated but baby still high. Will recheck in 2-3 hours and consider Cytotec if no cervical change on Pit. Anticipate SVD. #Pain: per patient request #FWB: Cat I #GBS negative  Joselyn Arrow, MD 1:15 AM

## 2019-04-21 NOTE — Anesthesia Preprocedure Evaluation (Addendum)
Anesthesia Evaluation  Patient identified by MRN, date of birth, ID band Patient awake    Reviewed: Allergy & Precautions, NPO status , Patient's Chart, lab work & pertinent test results  Airway Mallampati: II  TM Distance: >3 FB Neck ROM: Full    Dental no notable dental hx. (+) Teeth Intact   Pulmonary asthma ,    Pulmonary exam normal breath sounds clear to auscultation       Cardiovascular negative cardio ROS Normal cardiovascular exam Rhythm:Regular Rate:Normal     Neuro/Psych negative neurological ROS  negative psych ROS   GI/Hepatic negative GI ROS, Neg liver ROS,   Endo/Other  negative endocrine ROS  Renal/GU negative Renal ROS     Musculoskeletal negative musculoskeletal ROS (+)   Abdominal   Peds  Hematology Hgb 11.2 plt 330   Anesthesia Other Findings ALL tylenol and Shellfish  Reproductive/Obstetrics (+) Pregnancy                             Anesthesia Physical Anesthesia Plan  ASA: III  Anesthesia Plan: Epidural   Post-op Pain Management:    Induction:   PONV Risk Score and Plan:   Airway Management Planned:   Additional Equipment:   Intra-op Plan:   Post-operative Plan:   Informed Consent: I have reviewed the patients History and Physical, chart, labs and discussed the procedure including the risks, benefits and alternatives for the proposed anesthesia with the patient or authorized representative who has indicated his/her understanding and acceptance.       Plan Discussed with:   Anesthesia Plan Comments: (37 2/7 wk G3P2 w  Hx of asthma & allergy to tylenol for LEA)        Anesthesia Quick Evaluation

## 2019-04-21 NOTE — Progress Notes (Signed)
Labor Progress Note Dawn Haney is a 34 y.o. [id number] at [redacted]w[redacted]d presented for IOL for cholestasis.  S: Comfortable, not feeling ctx.    O:  BP (!) 91/53   Pulse (!) 57   Temp 98.1 F (36.7 C) (Oral)   Resp 15   Ht 5' (1.524 m)   Wt 63.5 kg   LMP 08/03/2018   SpO2 95%   BMI 27.32 kg/m  EFM: 130, moderate variability, pos accels, no decels, reactive TOCO: q42m  CVE: Dilation: 5 Effacement (%): 70 Cervical Position: Posterior Station: -2 Presentation: Vertex Exam by: Haroon Shatto   A&P: 34 y.o. [id number] [redacted]w[redacted]d [name].  #Labor: S/p Cytotec x1 and Foley bulb. Pit started at 2020. AROM at this check as fetal head better engaged and cervix thinning/soft. IUPC placed to left lateral to better monitor ctx as unable to increase Pit currently due to frequency of ctx. Anticipate SVD. #Pain: per patient request #FWB: Cat I #GBS negative  Joselyn Arrow, MD 2:30 AM

## 2019-04-21 NOTE — Progress Notes (Signed)
MD (1st Call)called concerning low B/P  No new orders received   Pt up to BR with stand by assist   No complaints of dizziness   Encouraged her to eat meal that was sitting on table untouched  And instructed her to drink more

## 2019-04-21 NOTE — Lactation Note (Addendum)
This note was copied from a baby's chart. Lactation Consultation Note  Patient Name: Dawn Haney CVELF'Y Date: 04/21/2019 Reason for consult: Initial assessment;Early term 37-38.6wks P1, 13 hour female ETI infant. Per mom, her feeding choice is to pump and formula fed she doesn't want to latch infant to breast. Per mom, she had high milk supply with her other children.  Per mom, she doesn't want to use Clydie Braun interpreter [facility] had it confirmed by [facility] interpreter Zeus # [phone] Mom used harmony hand pump and had pumped 30 mls of colostrum in bottle but  infant only had  consumed 15 mls of EBM  Prior to [facility] walking in the room and was asleep in mom's arms. LC discussed with mom how to convert a harmony cylinder hand pump into a DEBP for home use until she can purchase her own DEBP. Mom knows that due to infant drinking the EBM from the bottle and not all 30 mls , if not used within 1-2 hours the EBM  must be discarded  to avoid this in the future,  LC asked mom to put small amounts of EBM in bottles given by LC. Mom understands if she  is pumping 30 mls  or more per pumping session example given  within first 24 hours maybe  put  10 mls in 3 bottles each as not to waste her EBM and labels with time if she stores them in the fridge.  LC discussed infant intake from different age stages due mom not latching infant at breast mom understands to give infant from birth -24 hours ( 5-15 mls), 24-48 hours of life ( 15-30 mls) and 48-72 hours of life (30-60 mls ) sheet given. Mom will give infant her pumped breast milk first before supplementing with formula. LC set mom up with the DEBP, mom understands to pump every 3 hours for 15 minutes on initial settings .    Mom shown how to use DEBP & how to disassemble, clean, & reassemble parts. Mom knows to feeding infant according to cues, on demand , 8 to 12 times within 24 hours and not to exceed 3 hours without feeding infant. Mom will do as much STS  as possible with infant. Mom knows to call RN or LC if she has any questions or concerns. Mom made aware of O/P services, breastfeeding support groups, community resources, and our phone # for post-discharge questions.  Maternal Data Formula Feeding for Exclusion: Yes Reason for exclusion: Mother's choice to formula and breast feed on admission Does the patient have breastfeeding experience prior to this delivery?: Yes  Feeding    LATCH Score                   Interventions Interventions: Hand pump;DEBP;Expressed milk;Breast massage;Skin to skin  Lactation Tools Discussed/Used WIC Program: No Pump Review: Setup, frequency, and cleaning;Milk Storage   Consult Status Consult Status: Follow-up Date: 04/22/19 Follow-up type: In-patient    Danelle Earthly 04/21/2019, 10:26 PM

## 2019-04-22 DIAGNOSIS — Z30017 Encounter for initial prescription of implantable subdermal contraceptive: Secondary | ICD-10-CM

## 2019-04-22 MED ORDER — IBUPROFEN 600 MG PO TABS: 600.0000 mg | ORAL_TABLET | Freq: Four times a day (QID) | ORAL | 0 refills | Status: DC

## 2019-04-22 MED ORDER — SENNOSIDES-DOCUSATE SODIUM 8.6-50 MG PO TABS: 2.0000 | ORAL_TABLET | ORAL | 0 refills | Status: DC

## 2019-04-22 MED ORDER — LIDOCAINE HCL 1 % IJ SOLN
INTRAMUSCULAR | Status: AC
  Administered 2019-04-22: 20 mL
  Filled 2019-04-22: qty 20

## 2019-04-22 MED ORDER — ETONOGESTREL 68 MG ~~LOC~~ IMPL
68.0000 mg | DRUG_IMPLANT | Freq: Once | SUBCUTANEOUS | Status: AC
  Administered 2019-04-22: 68 mg via SUBCUTANEOUS
  Filled 2019-04-22: qty 1

## 2019-04-22 MED ORDER — LIDOCAINE HCL 1 % IJ SOLN: 0.0000 mL | Freq: Once | INTRAMUSCULAR | Status: DC | PRN

## 2019-04-22 NOTE — Procedures (Addendum)
Post-Placental Nexplanon Insertion Procedure Note  Patient was identified. Informed consent was signed, signed copy in chart. A time-out was performed.    The insertion site was identified 8-10 cm (3-4 inches) from the medial epicondyle of the humerus and 3-5 cm (1.25-2 inches) posterior to (below) the sulcus (groove) between the biceps and triceps muscles of the patient's left arm and marked. The site was prepped and draped in the usual sterile fashion. Pt was prepped with alcohol swab and then injected with 3 cc of 1% lidocaine. The site was prepped with chlorhexidine solution as she has a shellfish allergy . Nexplanon removed form packaging,  Device confirmed in needle, then inserted full length of needle and withdrawn per handbook instructions. Provider and patient verified presence of the implant in the woman's arm by palpation. Pt insertion site was covered with steristrips/adhesive bandage and pressure bandage. There was minimal blood loss. Patient tolerated procedure well.  Patient was given post procedure instructions and Nexplanon user card with expiration date. Condoms were recommended for STI prevention. Patient was asked to keep the pressure dressing on for 24 hours to minimize bruising and keep the adhesive bandage on for 3-5 days. The patient verbalized understanding of the plan of care and agrees.   Lot # [id number] Expiration Date 08/09/2021   Leonides Cave, DO, PGY-1 OBGYN Faculty Teaching Service   I was immediately available during the procedure.  Jerilynn Birkenhead, MD [facility] Family Medicine Fellow, [facility] for [employer], [facility]

## 2019-04-22 NOTE — Lactation Note (Signed)
This note was copied from a baby's chart. Lactation Consultation Note  Patient Name: Girl Chantel Teti [id number]'[id number] Date: 04/22/2019 Reason for consult: Follow-up assessment;Infant weight loss;Other (Comment)(per mom  plans to pump and bottle feed)  Baby is 26 hours old  Per mom I prefer to pump and bottle feed.  Not active with [facility] but is interested in signing up.  LC provided the [place] phone number and recommended calling WIC and LC  Informed mom the LC would fax a request to [facility] to sign up and to obtain a DEBP. LC explained if WIC was unable to sign her up she would have to purchase a DEBP. Mom mentioned she would look at [facility].  LC reviewed supply and demand / importance of being consistent with pumping around the clock 8-10 times both breast for 15 -20 mins to establish milk supply and  Protect the level. Per mom I pumped with my premie baby for 5 months.  S/S of mastitis reviewed, storage of breast milk.  Mom aware she can call with questions even though pumping and bottle feeding.     Maternal Data    Feeding Feeding Type: (per mom the baby recently fed) Nipple Type: Slow - flow  LATCH Score                   Interventions Interventions: Breast feeding basics reviewed  Lactation Tools Discussed/Used Tools: Pump Breast pump type: Manual;Double-Electric Breast Pump WIC Program: (per mom plans to pump and bottle feed. - LC provided the resource with [facility] phone number and LC faxed a request to [facility] to sign up and DEBP - [place]) Pump Review: Milk Storage(DEBP and hand pump already set up)   Consult Status Consult Status: Complete Date: 04/22/19    Kathrin Greathouse 04/22/2019, 11:27 AM

## 2019-05-06 ENCOUNTER — Ambulatory Visit: Payer: Medicaid Other | Admitting: Allergy and Immunology

## 2019-05-21 ENCOUNTER — Encounter: Payer: Self-pay | Admitting: Obstetrics and Gynecology

## 2019-05-21 ENCOUNTER — Telehealth (INDEPENDENT_AMBULATORY_CARE_PROVIDER_SITE_OTHER): Payer: Medicaid Other | Admitting: Obstetrics and Gynecology

## 2019-05-21 DIAGNOSIS — Z975 Presence of (intrauterine) contraceptive device: Secondary | ICD-10-CM

## 2019-05-21 MED ORDER — PRENATAL 27-0.8 MG PO TABS: 1.0000 | ORAL_TABLET | Freq: Every day | ORAL | 11 refills | Status: DC

## 2019-05-21 NOTE — Progress Notes (Signed)
I connected with@ on 05/21/19 at 10:00 AM EDT by: My Chart and verified that I am speaking with the correct person using two identifiers.  Patient is located at home and provider is located at [place].     The purpose of this virtual visit is to provide medical care while limiting exposure to the novel coronavirus. I discussed the limitations, risks, security and privacy concerns of performing an evaluation and management service by MyChart and the availability of in person appointments. I also discussed with the patient that there may be a patient responsible charge related to this service. By engaging in this virtual visit, you consent to the provision of healthcare.  Additionally, you authorize for your insurance to be billed for the services provided during this visit.  The patient expressed understanding and agreed to proceed.  The following staff members participated in the virtual visit:  Pt, nurse, pt and interrupter  Post Partum Visit Note Subjective:    Ms. Dawn Haney is a 34 y.o. [phone] female who presents for a postpartum visit. She is 4 weeks postpartum following a spontaneous vaginal delivery. I have fully reviewed the prenatal and intrapartum course. The delivery was at 37 gestational weeks.d/t cholestasias of pregnancy Outcome: spontaneous vaginal delivery. Anesthesia: epidural. Postpartum course has been unremarkable. Baby's course has been unremarkable. Baby is feeding by both breast and bottle - Similac with Iron. Bleeding staining only, brown, red and clots. Bowel function is normal. Bladder function is normal. Patient is not sexually active. Contraception method is Nexplanon. Postpartum depression screening: negative.  The following portions of the patient's history were reviewed and updated as appropriate: allergies, current medications, past family history, past medical history, past social history, past surgical history and problem list.  Review of Systems Pertinent  items noted in HPI and remainder of comprehensive ROS otherwise negative.   Objective:  There were no vitals filed for this visit. Self-Obtained       Assessment:    Nl postpartum exam. Pap smear not done at today's visit.   Plan:    1. Contraception: Nexplanon 2. Return to nl ADL's as tolerates 3. Follow up in 1 yr or as needed.   10 minutes of non-face-to-face time spent with the patient   Thom Chimes, [facility] 05/21/2019 9:53 AM

## 2019-05-24 ENCOUNTER — Encounter: Payer: Self-pay | Admitting: Allergy

## 2019-05-24 ENCOUNTER — Other Ambulatory Visit: Payer: Self-pay

## 2019-05-24 ENCOUNTER — Ambulatory Visit (INDEPENDENT_AMBULATORY_CARE_PROVIDER_SITE_OTHER): Payer: Medicaid Other | Admitting: Allergy

## 2019-05-24 VITALS — BP 108/66 | HR 90 | Temp 98.7°F | Resp 18 | Ht 59.0 in | Wt 120.6 lb

## 2019-05-24 DIAGNOSIS — H109 Unspecified conjunctivitis: Secondary | ICD-10-CM

## 2019-05-24 DIAGNOSIS — J4550 Severe persistent asthma, uncomplicated: Secondary | ICD-10-CM | POA: Diagnosis not present

## 2019-05-24 DIAGNOSIS — T7800XA Anaphylactic reaction due to unspecified food, initial encounter: Secondary | ICD-10-CM

## 2019-05-24 DIAGNOSIS — J310 Chronic rhinitis: Secondary | ICD-10-CM | POA: Diagnosis not present

## 2019-05-24 DIAGNOSIS — T50905D Adverse effect of unspecified drugs, medicaments and biological substances, subsequent encounter: Secondary | ICD-10-CM

## 2019-05-24 MED ORDER — BUDESONIDE-FORMOTEROL FUMARATE 160-4.5 MCG/ACT IN AERO: 2.0000 | INHALATION_SPRAY | Freq: Two times a day (BID) | RESPIRATORY_TRACT | 2 refills | Status: DC

## 2019-05-24 MED ORDER — ALBUTEROL SULFATE HFA 108 (90 BASE) MCG/ACT IN AERS: 2.0000 | INHALATION_SPRAY | Freq: Four times a day (QID) | RESPIRATORY_TRACT | 1 refills | Status: DC | PRN

## 2019-05-24 MED ORDER — MONTELUKAST SODIUM 10 MG PO TABS: 10.0000 mg | ORAL_TABLET | Freq: Every day | ORAL | 2 refills | Status: DC

## 2019-05-24 MED ORDER — ALBUTEROL SULFATE (2.5 MG/3ML) 0.083% IN NEBU: 2.5000 mg | INHALATION_SOLUTION | Freq: Four times a day (QID) | RESPIRATORY_TRACT | 1 refills | Status: DC | PRN

## 2019-05-24 MED ORDER — EPINEPHRINE 0.3 MG/0.3ML IJ SOAJ: 0.3000 mg | Freq: Once | INTRAMUSCULAR | 1 refills | Status: AC

## 2019-05-24 NOTE — Progress Notes (Signed)
New Patient Note  RE: Dawn Haney MRN: [id number] DOB: [birth year] Date of Office Visit: 05/24/2019  Referring provider: Sloan Leiter, MD Primary care provider: Patient, No Pcp Per  Chief Complaint: asthma  History of present illness: Dawn Haney is a 34 y.o. female presenting today for evaluation of asthma.  A Dawn Haney interpreter was available via online for this visit.  She has history of asthma that she states she was diagnosed with 2 years ago.  She reports symptoms of difficulty breathing, shortness of breath, wheezing, chest tightness, coughing.  She does have nighttime awakenings.  Symptoms tend to be worse when she has URI symptoms as well as strong odors/smell.  She states she has been having the symptoms more lately.  She does have a nebulizer machine that she feels works better than the albuterol inhaler.  For the past 2 weeks she has needed to use nebulizer daily and multiple times a day even.  She does not have a spacer.  She had an ICU admission in Dec 2021 while in her 3rd trimester where she required Bipap therapy and steroids and antibiotics.    She completed an azithromycin course prescribed from ED and she states she felt better. She also used Mucinex as well.   She does report itchy/watery eyes.    She states with shrimp ingestion she starts to have itching in her throat and red dots on her skin.  This occurred initially about 10 years ago.  She states if she eats uncooked beef she has throat itchiness but if it is well-done she has no issue.   She can eat fish without issue.   Tylenol she reports she has similar reaction as with shellfish.   Review of systems: Review of Systems  Constitutional: Negative.   HENT:       See HPI  Eyes: Negative.   Respiratory:       See HPI  Cardiovascular: Negative.   Gastrointestinal: Negative.   Musculoskeletal: Negative.   Skin: Negative.   Neurological: Negative.     All other systems negative unless  noted above in HPI  Past medical history: Past Medical History:  Diagnosis Date  . Acute respiratory failure with hypoxia ([place]) 03/07/2019  . Asthma   . Asthma affecting pregnancy in third trimester 03/07/2019  . Bronchitis with asthma, acute 03/08/2019  . Bronchitis with asthma, acute 03/08/2019  . Cholestasis during pregnancy   . Cholestasis of pregnancy 04/20/2019  . Cholestasis of pregnancy, antepartum 01/17/2019   Cholestasis - O26.619, K83.1 Q 4 wks 28 37 or as per MFM   . Placenta previa 03/07/2019   02/21/19: posterior previa 03/04/19: resolved  . Previous preterm delivery, antepartum 01/14/2019   SVD at 31 weeks in 2012  . Supervision of high risk pregnancy, antepartum 01/08/2019    Nursing Staff Provider Office Location  [place] Dating   LMP Language    Karen/English  Anatomy US   Flu Vaccine   Given 04/11/19 Genetic Screen  NIPS: low risks   AFP:   First Screen:  Quad:   TDaP vaccine   Declined Hgb A1C or  GTT Early  Third trimester: normal 2hr GTT  Rhogam     LAB RESULTS  Feeding Plan  undecided Blood Type B/Positive/-- (11/09 1005)  Contraception  Nexplanon Antibody Evelena Leyden    Past surgical history: Past Surgical History:  Procedure Laterality Date  . NO PAST SURGERIES      Family history:  Family History  Problem Relation Age  of Onset  . Asthma Neg Hx   . Allergies Neg Hx   . Eczema Neg Hx     Social history: She lives in a home without carpeting with electric heating and a heat pump.  There are cats outside the home.  No concern for water damage, mildew averages in the home.  She does not work at this time.  She denies a smoking history.  Medication List: Current Outpatient Medications  Medication Sig Dispense Refill  . albuterol (VENTOLIN HFA) 108 (90 Base) MCG/ACT inhaler Inhale 1-2 puffs into the lungs every 6 (six) hours as needed for wheezing or shortness of breath. 18 g 1  . Blood Pressure Monitoring (BLOOD PRESSURE KIT) DEVI 1 kit by Does not apply route once a  week. Check BP regularly and record readings into the Bab[employer] App.  Large Cuff.  Dx O90.0 1 each 0  . Prenatal Vit-Fe Fumarate-FA (MULTIVITAMIN-PRENATAL) 27-0.8 MG TABS tablet Take 1 tablet by mouth daily at 12 noon. 30 tablet 11  . albuterol (PROVENTIL) (2.5 MG/3ML) 0.083% nebulizer solution Take 3 mLs (2.5 mg total) by nebulization every 6 (six) hours as needed for wheezing or shortness of breath. 75 mL 1  . albuterol (VENTOLIN HFA) 108 (90 Base) MCG/ACT inhaler Inhale 2 puffs into the lungs every 6 (six) hours as needed for wheezing or shortness of breath. 18 g 1  . budesonide-formoterol (SYMBICORT) 160-4.5 MCG/ACT inhaler Inhale 2 puffs into the lungs 2 (two) times daily. 1 Inhaler 2  . EPINEPHrine (EPIPEN 2-PAK) 0.3 mg/0.3 mL IJ SOAJ injection Inject 0.3 mLs (0.3 mg total) into the muscle once for 1 dose. 0.3 mL 1  . montelukast (SINGULAIR) 10 MG tablet Take 1 tablet (10 mg total) by mouth at bedtime. 30 tablet 2   No current facility-administered medications for this visit.    Known medication allergies: Allergies  Allergen Reactions  . Shrimp [Shellfish Allergy] Itching  . Tylenol [Acetaminophen] Rash     Physical examination: Blood pressure 108/66, pulse 90, temperature 98.7 F (37.1 C), temperature source Temporal, resp. rate 18, height 4' 11"  (1.499 m), weight 120 lb 9.6 oz (54.7 kg), SpO2 97 %  General: Alert, interactive, in no acute distress. HEENT: PERRLA, TMs pearly gray, turbinates non-edematous without discharge, post-pharynx non erythematous. Neck: Supple without lymphadenopathy. Lungs: Clear to auscultation without wheezing, rhonchi or rales. {no increased work of breathing.   Pt used albuterol nebulizer prior to visit CV: Normal S1, S2 without murmurs. Abdomen: Nondistended, nontender. Skin: Warm and dry, without lesions or rashes. Extremities:  No clubbing, cyanosis or edema. Neuro:   Grossly intact.  Diagnositics/Labs: Labs:  Component     Latest Ref Rng  & Units 03/07/2019 03/08/2019  WBC     4.0 - 10.5 K/uL 12.5 (H) 14.7 (H)  RBC     3.87 - 5.11 MIL/uL 4.16 3.89  Hemoglobin     12.0 - 15.0 g/dL 10.7 (L) 9.9 (L)  HCT     36.0 - 46.0 % 33.9 (L) 31.3 (L)  MCV     80.0 - 100.0 fL 81.5 80.5  MCH     26.0 - 34.0 pg 25.7 (L) 25.4 (L)  MCHC     30.0 - 36.0 g/dL 31.6 31.6  RDW     11.5 - 15.5 % 14.3 14.6  Platelets     150 - 400 K/uL 334 360  nRBC     0.0 - 0.2 % 0.0 0.0  Neutrophils     % 78  90  NEUT#     1.7 - 7.7 K/uL 9.8 (H) 13.1 (H)  Lymphocytes     % 12 6  Lymphocyte #     0.7 - 4.0 K/uL 1.6 0.9  Monocytes Relative     % 6 3  Monocyte #     0.1 - 1.0 K/uL 0.8 0.4  Eosinophil     % 2 0  Eosinophils Absolute     0.0 - 0.5 K/uL 0.3 0.0  Basophil     % 1 0  Basophils Absolute     0.0 - 0.1 K/uL 0.1 0.0  Immature Granulocytes     % 1 1  Abs Immature Granulocytes     0.00 - 0.07 K/uL 0.06 0.17 (H)  03/07/19 CBC is s/p steroid use  Spirometry: FEV1: 1.59L 71%, FVC: 1.91L 73% predicted. FVC and FEV1 are both reduced.  Ratio consistent with restrive pattern.  Effort was poor however.  1st spirometry attempt.  She also used albuterol neb prior to visit.    Assessment and plan:   Asthma, severe persistent  - currently not well controlled  - have access to albuterol inhaler 2 puffs or albuterol 1 vial via nebulizer every 4-6 hours as needed for cough/wheeze/shortness of breath/chest tightness.  May use 15-20 minutes prior to activity.   Monitor frequency of use.    - start Symbicort 14mg 2 puffs twice a day with spacer  - start Singulair 158mdaily - take at bedtime.  If you notice any change in mood/behavior/sleep after starting Singulair then stop this medication and let usKoreanow.  Symptoms resolve after stopping the medication.    - both Symbicort and Singulair are safe to use while breastfeeding  - CBC from Jan 2021 does show absolute eosinophil count of 300 thus you would be eligible for step-up in therapy for a asthma  biologic agent.   Asthma control goals:   Full participation in all desired activities (may need albuterol before activity)  Albuterol use two time or less a week on average (not counting use with activity)  Cough interfering with sleep two time or less a month  Oral steroids no more than once a year  No hospitalizations  Rhinoconjunctivitis, presumed allergic  - will obtain environmental allergy panel  - start singulair as above  Anaphylaxis due to food  -  continue avoidance of shellfish  - have access to self-injectable epinephrine Epipen 0.57m24mt all times  - follow emergency action plan in case of allergic reaction  Drug allergy  - continue to avoid tylenol (acetominphen)   Follow-up 6-8 weeks or sooner if needed   I appreciate the opportunity to take part in Srija's care. Please do not hesitate to contact me with questions.  Sincerely,   ShaPrudy FeelerD Allergy/Immunology [facility]

## 2019-05-24 NOTE — Patient Instructions (Addendum)
Asthma  - currently not well controlled  - have access to albuterol inhaler 2 puffs or albuterol 1 vial via nebulizer every 4-6 hours as needed for cough/wheeze/shortness of breath/chest tightness.  May use 15-20 minutes prior to activity.   Monitor frequency of use.    - start Symbicort 2 puffs twice a day with spacer  - start Singulair 10mg  daily - take at bedtime.  If you notice any change in mood/behavior/sleep after starting Singulair then stop this medication and let know.  Symptoms resolve after stopping the medication.    - both Symbicort and Singulair are safe to use while breastfeeding  - CBC from Jan 2021 does show absolute eosinophil count of 300 thus you would be eligible for step-up in therapy for a asthma biologic agent.   Asthma control goals:   Full participation in all desired activities (may need albuterol before activity)  Albuterol use two time or less a week on average (not counting use with activity)  Cough interfering with sleep two time or less a month  Oral steroids no more than once a year  No hospitalizations  Allergies  - will obtain environmental allergy panel  - start singulair as above  Food allergy  -  continue avoidance of shellfish  - have access to self-injectable epinephrine Epipen 0.3mg  at all times  - follow emergency action plan in case of allergic reaction  Drug allergy  - continue to avoid tylenol (acetominphen)   Follow-up 6-8 weeks or sooner if needed

## 2019-05-27 LAB — ALLERGENS W/TOTAL IGE AREA 2
Alternaria Alternata IgE: <0.1 kU/L
Aspergillus Fumigatus IgE: <0.1 kU/L
Bermuda Grass IgE: <0.1 kU/L
Cat Dander IgE: <0.1 kU/L
Cedar, Mountain IgE: <0.1 kU/L
Cladosporium Herbarum IgE: <0.1 kU/L
Cockroach, German IgE: 0.23 kU/L — AB
Common Silver Birch IgE: <0.1 kU/L
Cottonwood IgE: <0.1 kU/L
D Farinae IgE: <0.1 kU/L
D Pteronyssinus IgE: <0.1 kU/L
Dog Dander IgE: <0.1 kU/L
Elm, American IgE: <0.1 kU/L
IgE (Immunoglobulin E), Serum: 66 IU/mL (ref 6–495)
Johnson Grass IgE: <0.1 kU/L
Maple/Box Elder IgE: <0.1 kU/L
Mouse Urine IgE: <0.1 kU/L
Oak, White IgE: <0.1 kU/L
Pecan, Hickory IgE: <0.1 kU/L
Penicillium Chrysogen IgE: <0.1 kU/L
Pigweed, Rough IgE: <0.1 kU/L
Ragweed, Short IgE: <0.1 kU/L
Sheep Sorrel IgE Qn: <0.1 kU/L
Timothy Grass IgE: <0.1 kU/L
White Mulberry IgE: <0.1 kU/L

## 2019-05-27 LAB — ALLERGEN PROFILE, SHELLFISH
Clam IgE: <0.1 kU/L
F023-IgE Crab: 0.12 kU/L — AB
F080-IgE Lobster: 0.1 kU/L — AB
F290-IgE Oyster: <0.1 kU/L
Scallop IgE: <0.1 kU/L
Shrimp IgE: 0.25 kU/L — AB

## 2019-06-15 ENCOUNTER — Ambulatory Visit: Payer: Medicaid Other | Attending: Internal Medicine

## 2019-06-15 DIAGNOSIS — Z23 Encounter for immunization: Secondary | ICD-10-CM

## 2019-06-15 NOTE — Progress Notes (Signed)
   Covid-19 Vaccination Clinic  Name:  Dawn Haney    MRN: [id number] DOB: [birth year]  06/15/2019  Dawn Haney was observed post Covid-19 immunization for 15 minutes without incident. She was provided with Vaccine Information Sheet and instruction to access the V-Safe system.   Dawn Haney was instructed to call 911 with any severe reactions post vaccine: Marland Kitchen Difficulty breathing  . Swelling of face and throat  . A fast heartbeat  . A bad rash all over body  . Dizziness and weakness   Immunizations Administered    Name Date Dose VIS Date Route   Pfizer COVID-19 Vaccine 06/15/2019  3:05 PM 0.3 mL 02/15/2019 Intramuscular   Manufacturer: [employer], [employer]   Lot: [id number]   NDC: [address]-2

## 2019-06-28 ENCOUNTER — Other Ambulatory Visit: Payer: Self-pay

## 2019-06-28 ENCOUNTER — Ambulatory Visit (INDEPENDENT_AMBULATORY_CARE_PROVIDER_SITE_OTHER): Payer: Medicaid Other | Admitting: Allergy

## 2019-06-28 ENCOUNTER — Encounter: Payer: Self-pay | Admitting: Allergy

## 2019-06-28 VITALS — BP 108/70 | HR 81 | Temp 98.7°F | Resp 16 | Wt 120.2 lb

## 2019-06-28 DIAGNOSIS — J3089 Other allergic rhinitis: Secondary | ICD-10-CM | POA: Diagnosis not present

## 2019-06-28 DIAGNOSIS — T7800XA Anaphylactic reaction due to unspecified food, initial encounter: Secondary | ICD-10-CM | POA: Diagnosis not present

## 2019-06-28 DIAGNOSIS — J4550 Severe persistent asthma, uncomplicated: Secondary | ICD-10-CM

## 2019-06-28 DIAGNOSIS — L505 Cholinergic urticaria: Secondary | ICD-10-CM | POA: Diagnosis not present

## 2019-06-28 DIAGNOSIS — T50905D Adverse effect of unspecified drugs, medicaments and biological substances, subsequent encounter: Secondary | ICD-10-CM

## 2019-06-28 DIAGNOSIS — H1013 Acute atopic conjunctivitis, bilateral: Secondary | ICD-10-CM

## 2019-06-28 MED ORDER — MONTELUKAST SODIUM 10 MG PO TABS: 10.0000 mg | ORAL_TABLET | Freq: Every day | ORAL | 2 refills | Status: DC

## 2019-06-28 MED ORDER — BUDESONIDE-FORMOTEROL FUMARATE 160-4.5 MCG/ACT IN AERO: 2.0000 | INHALATION_SPRAY | Freq: Two times a day (BID) | RESPIRATORY_TRACT | 2 refills | Status: DC

## 2019-06-28 MED ORDER — EPINEPHRINE 0.3 MG/0.3ML IJ SOAJ: 0.3000 mg | Freq: Once | INTRAMUSCULAR | 2 refills | Status: AC

## 2019-06-28 NOTE — Progress Notes (Signed)
Follow-up Note  RE: Dawn Haney MRN: [id number] DOB: 1985/03/26 Date of Office Visit: 06/28/2019   History of present illness: Dawn Haney is a 34 y.o. female presenting today for follow-up of severe persistent asthma, allergic rhinitis with conjunctivitis, food allergy, drug allergy.  She was last seen for her initial visit on 05/24/2019 by myself.  A Dawn Haney translator is available via [employer] for this visit. In regards to her asthma she states that she is doing much better.  She states since starting the Symbicort 162 puffs twice a day with a spacer that she has not needed to use her albuterol at all.  She also taking Singulair daily as well.  She states she tried to see if she did not need to take the Singulair and when she was not taking it she states she did have the more difficulty breathing and resume use and her breathing improved.  She denies any nighttime awakenings.  She has not required any ED or urgent care visits or any systemic steroid needs since her last visit.  She is very happy that her breathing is doing better. From an allergy standpoint that she is having some nasal congestion and she is using a nose spray but she is not sure what the spray is. She also states that when she gets too hot from being outside or if she eats hot food that her skin starts to get itchy and she develops little bumps on her skin. She has been avoiding shellfish and has not had any reactions.  She states that the pharmacy did not have the prescription for EpiPen when she went to pick it up after the last visit. She continues to avoid acetaminophen.  She is concerned how she will afford her medications soon.  She states that her Medicaid is set to run out in early May as it is the Medicaid related to pregnancy and she is no longer pregnant.  Review of systems: Review of Systems  Constitutional: Negative.   HENT:       See HPI  Eyes: Negative.   Respiratory: Negative.     Cardiovascular: Negative.   Gastrointestinal: Negative.   Musculoskeletal: Negative.   Skin:       See HPI  Neurological: Negative.     All other systems negative unless noted above in HPI  Past medical/social/surgical/family history have been reviewed and are unchanged unless specifically indicated below.  No changes  Medication List: Current Outpatient Medications  Medication Sig Dispense Refill  . albuterol (PROVENTIL) (2.5 MG/3ML) 0.083% nebulizer solution Take 3 mLs (2.5 mg total) by nebulization every 6 (six) hours as needed for wheezing or shortness of breath. 75 mL 1  . albuterol (VENTOLIN HFA) 108 (90 Base) MCG/ACT inhaler Inhale 2 puffs into the lungs every 6 (six) hours as needed for wheezing or shortness of breath. 18 g 1  . budesonide-formoterol (SYMBICORT) 160-4.5 MCG/ACT inhaler Inhale 2 puffs into the lungs 2 (two) times daily. 1 Inhaler 2  . montelukast (SINGULAIR) 10 MG tablet Take 1 tablet (10 mg total) by mouth at bedtime. 30 tablet 2  . Blood Pressure Monitoring (BLOOD PRESSURE KIT) DEVI 1 kit by Does not apply route once a week. Check BP regularly and record readings into the [employer] App.  Large Cuff.  Dx O90.0 1 each 0  . Prenatal Vit-Fe Fumarate-FA (MULTIVITAMIN-PRENATAL) 27-0.8 MG TABS tablet Take 1 tablet by mouth daily at 12 noon. 30 tablet 11   No current facility-administered medications for  this visit.     Known medication allergies: Allergies  Allergen Reactions  . Shrimp [Shellfish Allergy] Itching  . Tylenol [Acetaminophen] Rash     Physical examination: Blood pressure 108/70, pulse 81, temperature 98.7 F (37.1 C), temperature source Temporal, resp. rate 16, weight 120 lb 3.2 oz (54.5 kg), SpO2 97 %, unknown if currently breastfeeding.  General: Alert, interactive, in no acute distress. HEENT: TMs pearly gray, turbinates moderately edematous without discharge, post-pharynx non erythematous. Neck: Supple without  lymphadenopathy. Lungs: Clear to auscultation without wheezing, rhonchi or rales. {no increased work of breathing. CV: Normal S1, S2 without murmurs. Abdomen: Nondistended, nontender. Skin: Warm and dry, without lesions or rashes. Extremities:  No clubbing, cyanosis or edema. Neuro:   Grossly intact.  Diagnositics/Labs: Labs:  Component     Latest Ref Rng & Units 05/24/2019  Clam IgE     Class 0 kU/L <0.10  F023-IgE Crab     Class 0/I kU/L 0.12 (A)  Shrimp IgE     Class 0/I kU/L 0.25 (A)  Scallop IgE     Class 0 kU/L <0.10  F290-IgE Oyster     Class 0 kU/L <0.10  F080-IgE Lobster     Class 0/I kU/L 0.10 (A)   Component     Latest Ref Rng & Units 05/24/2019  IgE (Immunoglobulin E), Serum     6 - 495 IU/mL 66  D Pteronyssinus IgE     Class 0 kU/L <0.10  D Farinae IgE     Class 0 kU/L <0.10  Cat Dander IgE     Class 0 kU/L <0.10  Dog Dander IgE     Class 0 kU/L <0.10  [place] Grass IgE     Class 0 kU/L <0.10  Timothy Grass IgE     Class 0 kU/L <0.10  Johnson Grass IgE     Class 0 kU/L <0.10  Cockroach, German IgE     Class 0/I kU/L 0.23 (A)  Penicillium Chrysogen IgE     Class 0 kU/L <0.10  Cladosporium Herbarum IgE     Class 0 kU/L <0.10  Aspergillus Fumigatus IgE     Class 0 kU/L <0.10  Alternaria Alternata IgE     Class 0 kU/L <0.10  Maple/Box Elder IgE     Class 0 kU/L <0.10  Common Silver Wendee Copp IgE     Class 0 kU/L <0.10  Cedar, [place] IgE     Class 0 kU/L <0.10  Oak, White IgE     Class 0 kU/L <0.10  Elm, American IgE     Class 0 kU/L <0.10  Cottonwood IgE     Class 0 kU/L <0.10  Pecan, Hickory IgE     Class 0 kU/L <0.10  White Mulberry IgE     Class 0 kU/L <0.10  Ragweed, Short IgE     Class 0 kU/L <0.10  Pigweed, Rough IgE     Class 0 kU/L <0.10  Sheep Sorrel IgE Qn     Class 0 kU/L <0.10  Mouse Urine IgE     Class 0 kU/L <0.10    Spirometry: FEV1: 2.05 L 91% , FVC: 3.62 L 139%, ratio consistent with Nonobstructive pattern.  This is  much improved from initial study.  Assessment and plan:   Asthma, severe persistent  - under better control!  - lung function is normal today!  - have access to albuterol inhaler 2 puffs or albuterol 1 vial via nebulizer every 4-6 hours as needed for cough/wheeze/shortness of  breath/chest tightness.  May use 15-20 minutes prior to activity.   Monitor frequency of use.    - continue Symbicort 160mg 2 puffs twice a day with spacer  - continue Singulair 140mdaily - take at bedtime.    - both Symbicort and Singulair are safe to use while breastfeeding  - CBC from Jan 2021 does show absolute eosinophil count of 300 thus you would be eligible for step-up in therapy for a asthma biologic agent.   Asthma control goals:   Full participation in all desired activities (may need albuterol before activity)  Albuterol use two time or less a week on average (not counting use with activity)  Cough interfering with sleep two time or less a month  Oral steroids no more than once a year  No hospitalizations  Allergic rhinitis with conjunctivitis  - environmental allergy panel is positive to cockroach.  Allergen avoidance measures provided  - can use Nasacort 2 sprays each nostril daily as needed for nasal congestion  - singulair as above  Urticaria, cholinergic  -Cholinergic urticaria is typically very fine papules triggered by increase in body temperature  - take Xyzal 52m61maily during warmer weather months.  May take additional doses if necessary.  If symptoms continue will recommend she add Pepcid to this regimen.  Anaphylaxis due to food  -  continue avoidance of shellfish  - have access to self-injectable epinephrine Epipen 0.3mg98m all times  - follow emergency action plan in case of allergic reaction  Drug allergy  - continue to avoid tylenol (acetominphen)   Follow-up 3-4 months or sooner if needed   I appreciate the opportunity to take part in Kyrene's care. Please do not  hesitate to contact me with questions.  Sincerely,   ShayPrudy Feeler [facility]

## 2019-06-28 NOTE — Patient Instructions (Addendum)
Asthma  - under better control!  - lung function is normal today!  - have access to albuterol inhaler 2 puffs or albuterol 1 vial via nebulizer every 4-6 hours as needed for cough/wheeze/shortness of breath/chest tightness.  May use 15-20 minutes prior to activity.   Monitor frequency of use.    - continue Symbicort 2 puffs twice a day with spacer  - continue Singulair 10mg  daily - take at bedtime.    - both Symbicort and Singulair are safe to use while breastfeeding  - CBC from Jan 2021 does show absolute eosinophil count of 300 thus you would be eligible for step-up in therapy for a asthma biologic agent.   Asthma control goals:   Full participation in all desired activities (may need albuterol before activity)  Albuterol use two time or less a week on average (not counting use with activity)  Cough interfering with sleep two time or less a month  Oral steroids no more than once a year  No hospitalizations  Allergies  - environmental allergy panel is positive to cockroach.  Allergen avoidance measures provided  - can use Nasacort 2 sprays each nostril daily as needed for nasal congestion  - singulair as above  Hives  - you are having cholinergic hives triggered by being over-heated  - take Xyzal 5mg  daily during warmer weather months  Food allergy  -  continue avoidance of shellfish  - have access to self-injectable epinephrine Epipen 0.3mg  at all times  - follow emergency action plan in case of allergic reaction  Drug allergy  - continue to avoid tylenol (acetominphen)   Follow-up 3-4 months or sooner if needed

## 2019-07-10 ENCOUNTER — Ambulatory Visit: Payer: Medicaid Other | Attending: Internal Medicine

## 2019-07-10 DIAGNOSIS — Z23 Encounter for immunization: Secondary | ICD-10-CM

## 2019-07-10 NOTE — Progress Notes (Signed)
   Covid-19 Vaccination Clinic  Name:  Dawn Haney    MRN: [id number] DOB: Sep 29, 1985  07/10/2019  Dawn Haney was observed post Covid-19 immunization for 15 minutes without incident. She was provided with Vaccine Information Sheet and instruction to access the V-Safe system.   Dawn Haney was instructed to call 911 with any severe reactions post vaccine: Marland Kitchen Difficulty breathing  . Swelling of face and throat  . A fast heartbeat  . A bad rash all over body  . Dizziness and weakness   Immunizations Administered    Name Date Dose VIS Date Route   Pfizer COVID-19 Vaccine 07/10/2019  9:27 AM 0.3 mL 05/01/2018 Intramuscular   Manufacturer: [employer], [employer]   Lot: [id number]   NDC: [address]-9

## 2019-07-23 ENCOUNTER — Other Ambulatory Visit: Payer: Self-pay

## 2019-07-23 MED ORDER — BUDESONIDE-FORMOTEROL FUMARATE 160-4.5 MCG/ACT IN AERO: 2.0000 | INHALATION_SPRAY | Freq: Two times a day (BID) | RESPIRATORY_TRACT | 5 refills | Status: DC

## 2019-07-23 MED ORDER — MONTELUKAST SODIUM 10 MG PO TABS: 10.0000 mg | ORAL_TABLET | Freq: Every day | ORAL | 5 refills | Status: DC

## 2019-07-23 NOTE — Telephone Encounter (Signed)
Patient called stating she was out of refills for her Symbicort & Singulair. She called the pharmacy and was told to give [place] a call.  Scientist, research (life sciences)- [address].

## 2019-07-23 NOTE — Telephone Encounter (Signed)
Prescriptions have been sent in. Called and left a detailed voicemail per DPR permission advising of medication being sent in.

## 2019-08-22 ENCOUNTER — Other Ambulatory Visit: Payer: Self-pay | Admitting: Allergy

## 2019-09-27 ENCOUNTER — Ambulatory Visit: Payer: Medicaid Other | Admitting: Allergy

## 2019-10-03 ENCOUNTER — Encounter: Payer: Self-pay | Admitting: Allergy & Immunology

## 2019-10-03 ENCOUNTER — Ambulatory Visit (INDEPENDENT_AMBULATORY_CARE_PROVIDER_SITE_OTHER): Payer: Medicaid Other | Admitting: Allergy & Immunology

## 2019-10-03 ENCOUNTER — Other Ambulatory Visit: Payer: Self-pay

## 2019-10-03 VITALS — BP 92/60 | HR 83 | Resp 18 | Ht 59.0 in

## 2019-10-03 DIAGNOSIS — J4550 Severe persistent asthma, uncomplicated: Secondary | ICD-10-CM | POA: Diagnosis not present

## 2019-10-03 DIAGNOSIS — J3089 Other allergic rhinitis: Secondary | ICD-10-CM

## 2019-10-03 DIAGNOSIS — T50905D Adverse effect of unspecified drugs, medicaments and biological substances, subsequent encounter: Secondary | ICD-10-CM

## 2019-10-03 DIAGNOSIS — T7800XA Anaphylactic reaction due to unspecified food, initial encounter: Secondary | ICD-10-CM

## 2019-10-03 DIAGNOSIS — L505 Cholinergic urticaria: Secondary | ICD-10-CM | POA: Diagnosis not present

## 2019-10-03 DIAGNOSIS — T7800XD Anaphylactic reaction due to unspecified food, subsequent encounter: Secondary | ICD-10-CM

## 2019-10-03 MED ORDER — BUDESONIDE-FORMOTEROL FUMARATE 160-4.5 MCG/ACT IN AERO: 2.0000 | INHALATION_SPRAY | Freq: Two times a day (BID) | RESPIRATORY_TRACT | 5 refills | Status: DC

## 2019-10-03 MED ORDER — MONTELUKAST SODIUM 10 MG PO TABS: 10.0000 mg | ORAL_TABLET | Freq: Every day | ORAL | 5 refills | Status: DC

## 2019-10-03 NOTE — Progress Notes (Signed)
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 Severe persistent asthma, uncomplicated  - Lung testing looked great today. - We need to be better about using controllers on a more consistent basis.  - Spacer use reviewed. - Daily controller medication(s): Singulair 10mg  daily and Symbicort 160/4.50mcg two puffs twice daily with spacer  - Prior to physical activity: albuterol 2 puffs 10-15 minutes before physical activity. - Rescue medications: albuterol 4 puffs every 4-6 hours as needed - Asthma control goals:  * Full participation in all desired activities (may need albuterol before activity) * Albuterol use two time or less a week on average (not counting use with activity) * Cough interfering with sleep two time or less a month * Oral steroids no more than once a year * No hospitalizations  2. Perennial allergic rhinitis (cockroach) - Continue with cetirizine 10mg  daily as needed. - Continue with Rhinocort one spray per nostril daily as needed.  - Continue with montelukast 10mg  daily.   3 Cholinergic urticaria - Continue with Xyzal 5mg  daily during warmer weather months  4. Food allergy  -  continue avoidance of shellfish  - have access to self-injectable epinephrine Epipen 0.3mg  at all times  - follow emergency action plan in case of allergic reaction  5. Drug allergy - Continue with avoidance of Tylenol (acetominphen)   6. Return in about 3 months (around 01/03/2020). This can be an in-person, a virtual Webex or a telephone follow up visit.  Subjective:   Dawn Haney is a 34 y.o. female presenting today for follow up of  Chief Complaint  Patient presents with   Asthma    Dawn Haney has a history of the following: Patient Active  Problem List   Diagnosis Date Noted   Postpartum care following vaginal delivery 05/21/2019   Nexplanon in place 05/21/2019   Language barrier 02/11/2019   Alpha thalassemia silent carrier 02/07/2019    History obtained from: chart review and patient.  Dawn Haney is a 34 y.o. female presenting for a follow up visit. She was last seen in April 2021 by Dr. 14/05/2018. At that time, lung function looked great. She continued with Symbicort two puffs BID. She has a history of allergic rhinitis and has been positive to cockroach in the past. She was on Nasacort 2 sprays per nostril daily as needed as well as the Singulair for her symptoms. For her cholinergic urticaria, she was started on Xyzal 5 mg daily during the warmer months. It was recommended that she continue to avoid shellfish as well as acetaminophen.  Since last visit, she has done very well.  Asthma/Respiratory Symptom History: She is only using her Symbicort as needed. She is using her albuterol every day and the montelukast as needed. ACT score is 25, indicating excellent asthma control. She seems to have her inhalers confused slightly. Regardless, she has not needed to go to the emergency room has not needed prednisone for her breathing. She does report nighttime awakening around 1-2 times per week. She has not been using her Singulair.  Allergic Rhinitis Symptom History: She has not been using any kind of nose spray. She has not needed any antibiotics for sinus infections. She denies any epistaxis. She has not been on any antihistamines.  Food Allergy Symptom History: She continues to avoid shellfish. There've been no accidental  exposures. She does have an up-to-date EpiPen.  Urticaria Symptom History: Her urticaria have been controlled. She has not been using her antihistamines at all.  Her breathing tends to get worse in the winter months as well as when she is pregnant. Her youngest is now 60 months old and is enjoying eating rice  cereal. She shows me some amusing movies of this.  Otherwise, there have been no changes to her past medical history, surgical history, family history, or social history.    Review of Systems  Constitutional: Negative.  Negative for chills, fever, malaise/fatigue and weight loss.  HENT: Positive for congestion. Negative for ear discharge, ear pain and sinus pain.   Eyes: Negative for pain, discharge and redness.  Respiratory: Negative for cough, sputum production, shortness of breath and wheezing.   Cardiovascular: Negative.  Negative for chest pain and palpitations.  Gastrointestinal: Negative for abdominal pain, constipation, diarrhea, heartburn, nausea and vomiting.  Skin: Negative.  Negative for itching and rash.  Neurological: Negative for dizziness and headaches.  Endo/Heme/Allergies: Positive for environmental allergies. Does not bruise/bleed easily.       Objective:   Blood pressure (!) 92/60, pulse 83, resp. rate 18, height 4\' 11"  (1.499 m), SpO2 98 %, unknown if currently breastfeeding. Body mass index is 24.28 kg/m.   Physical Exam:  Physical Exam Constitutional:      Appearance: She is well-developed.     Comments: Smiling and pleasant.   HENT:     Head: Normocephalic and atraumatic.     Right Ear: Tympanic membrane, ear canal and external ear normal.     Left Ear: Tympanic membrane, ear canal and external ear normal.     Nose: No nasal deformity, septal deviation, mucosal edema or rhinorrhea.     Right Turbinates: Enlarged and swollen.     Left Turbinates: Enlarged and swollen.     Right Sinus: No maxillary sinus tenderness or frontal sinus tenderness.     Left Sinus: No maxillary sinus tenderness or frontal sinus tenderness.     Comments: Scant clear mucous present.     Mouth/Throat:     Mouth: Mucous membranes are not pale and not dry.     Pharynx: Uvula midline.     Comments: Cobblestoning present in the posterior oropharynx.  Eyes:     General:         Right eye: No discharge.        Left eye: No discharge.     Conjunctiva/sclera: Conjunctivae normal.     Right eye: Right conjunctiva is not injected. No chemosis.    Left eye: Left conjunctiva is not injected. No chemosis.    Pupils: Pupils are equal, round, and reactive to light.  Cardiovascular:     Rate and Rhythm: Normal rate and regular rhythm.     Heart sounds: Normal heart sounds.  Pulmonary:     Effort: Pulmonary effort is normal. No tachypnea, accessory muscle usage or respiratory distress.     Breath sounds: Normal breath sounds. No wheezing, rhonchi or rales.     Comments: Moving air well in all lung fields. No increased work of breathing noted. Chest:     Chest wall: No tenderness.  Lymphadenopathy:     Cervical: No cervical adenopathy.  Skin:    Coloration: Skin is not pale.     Findings: No abrasion, erythema, petechiae or rash. Rash is not papular, urticarial or vesicular.     Comments: She is scratching over her body, but overall there  are no urticaria present.   Neurological:     Mental Status: She is alert.  Psychiatric:        Behavior: Behavior is cooperative.      Diagnostic studies:    Spirometry: results normal (FEV1: 1.91/81%, FVC: 2.48/92%, FEV1/FVC: 77%).    Spirometry consistent with normal pattern.   Allergy Studies: none       Malachi Bonds, MD  [facility]

## 2019-10-03 NOTE — Patient Instructions (Addendum)
1. Severe persistent asthma, uncomplicated  - Lung testing looked great today. - We need to be better about using controllers on a more consistent basis.  - Spacer use reviewed. - Daily controller medication(s): Singulair 10mg  daily and Symbicort 160/4.43mcg two puffs twice daily with spacer    - Prior to physical activity: albuterol 2 puffs 10-15 minutes before physical activity. - Rescue medications: albuterol 4 puffs every 4-6 hours as needed - Asthma control goals:  * Full participation in all desired activities (may need albuterol before activity) * Albuterol use two time or less a week on average (not counting use with activity) * Cough interfering with sleep two time or less a month * Oral steroids no more than once a year * No hospitalizations  2. Perennial allergic rhinitis (cockroach) - Continue with cetirizine 10mg  daily as needed. - Continue with Rhinocort one spray per nostril daily as needed.  - Continue with montelukast 10mg  daily.   3 Cholinergic urticaria - Continue with Xyzal 5mg  daily during warmer weather months  4. Food allergy  -  continue avoidance of shellfish  - have access to self-injectable epinephrine Epipen 0.3mg  at all times  - follow emergency action plan in case of allergic reaction  5. Drug allergy - Continue with avoidance of Tylenol (acetominphen)   6. Return in about 3 months (around 01/03/2020). This can be an in-person, a virtual Webex or a telephone follow up visit.   Please inform of any Emergency Department visits, hospitalizations, or changes in symptoms. Call before going to the ED for breathing or allergy symptoms since we might be able to fit you in for a sick visit. Feel free to contact anytime with any questions, problems, or concerns.  It was a pleasure to meet you today!  Websites that have reliable patient information: 1. American Academy of Asthma, Allergy, and Immunology: www.aaaai.org 2. Food Allergy Research and  Education (FARE): foodallergy.org 3. Mothers of Asthmatics: http://www.asthmacommunitynetwork.org 4. American College of Allergy, Asthma, and Immunology: www.acaai.org   COVID-19 Vaccine Information can be found at: 01/05/2020 For questions related to vaccine distribution or appointments, please email [email or web address] or call [phone].     "Like" [place] on Facebook and Instagram for our latest updates!        Make sure you are registered to vote! If you have moved or changed any of your contact information, you will need to get this updated before voting!  In some cases, you MAY be able to register to vote online: [place]

## 2020-01-07 ENCOUNTER — Ambulatory Visit: Payer: Medicaid Other | Admitting: Allergy & Immunology

## 2020-01-07 ENCOUNTER — Ambulatory Visit (INDEPENDENT_AMBULATORY_CARE_PROVIDER_SITE_OTHER): Payer: Medicaid Other | Admitting: Allergy & Immunology

## 2020-01-07 ENCOUNTER — Other Ambulatory Visit: Payer: Self-pay

## 2020-01-07 ENCOUNTER — Encounter: Payer: Self-pay | Admitting: Allergy & Immunology

## 2020-01-07 VITALS — BP 98/82 | HR 90 | Temp 98.1°F | Resp 14 | Ht 60.0 in | Wt 120.6 lb

## 2020-01-07 DIAGNOSIS — J3089 Other allergic rhinitis: Secondary | ICD-10-CM | POA: Diagnosis not present

## 2020-01-07 DIAGNOSIS — T7800XD Anaphylactic reaction due to unspecified food, subsequent encounter: Secondary | ICD-10-CM | POA: Diagnosis not present

## 2020-01-07 DIAGNOSIS — J4550 Severe persistent asthma, uncomplicated: Secondary | ICD-10-CM

## 2020-01-07 DIAGNOSIS — T50905D Adverse effect of unspecified drugs, medicaments and biological substances, subsequent encounter: Secondary | ICD-10-CM

## 2020-01-07 DIAGNOSIS — L505 Cholinergic urticaria: Secondary | ICD-10-CM | POA: Diagnosis not present

## 2020-01-07 DIAGNOSIS — T7800XA Anaphylactic reaction due to unspecified food, initial encounter: Secondary | ICD-10-CM

## 2020-01-07 MED ORDER — MONTELUKAST SODIUM 10 MG PO TABS: 10.0000 mg | ORAL_TABLET | Freq: Every day | ORAL | 5 refills | Status: DC

## 2020-01-07 MED ORDER — BUDESONIDE-FORMOTEROL FUMARATE 160-4.5 MCG/ACT IN AERO: 2.0000 | INHALATION_SPRAY | Freq: Two times a day (BID) | RESPIRATORY_TRACT | 5 refills | Status: DC

## 2020-01-07 MED ORDER — ALBUTEROL SULFATE (2.5 MG/3ML) 0.083% IN NEBU: 2.5000 mg | INHALATION_SOLUTION | Freq: Four times a day (QID) | RESPIRATORY_TRACT | 1 refills | Status: DC | PRN

## 2020-01-07 NOTE — Patient Instructions (Addendum)
1. Severe persistent asthma, uncomplicated  - Lung testing deferred today.  - You are doing very well!  - We will not make any medication changes today. - But we may change the medicine after your Medicaid runs out in February. - Daily controller medication(s): Singulair 10mg  daily and Symbicort 160/4.65mcg two puffs twice daily with spacer    - Prior to physical activity: albuterol 2 puffs 10-15 minutes before physical activity. - Rescue medications: albuterol 4 puffs every 4-6 hours as needed - Asthma control goals:  * Full participation in all desired activities (may need albuterol before activity) * Albuterol use two time or less a week on average (not counting use with activity) * Cough interfering with sleep two time or less a month * Oral steroids no more than once a year * No hospitalizations  2. Perennial allergic rhinitis (cockroach) - Continue with cetirizine 10mg  daily as needed. - Continue with Rhinocort one spray per nostril daily as needed.  - Continue with montelukast 10mg  daily.   3 Cholinergic urticaria - Continue with Xyzal 5mg  daily during warmer weather months  4. Food allergy  - Continue avoidance of shellfish  - Have access to self-injectable epinephrine Epipen 0.3mg  at all times  - Follow emergency action plan in case of allergic reaction   5. Drug allergy - Continue with avoidance of Tylenol (acetominphen)   6. Return in about 3 months (around 04/08/2020).    Please inform of any Emergency Department visits, hospitalizations, or changes in symptoms. Call before going to the ED for breathing or allergy symptoms since we might be able to fit you in for a sick visit. Feel free to contact anytime with any questions, problems, or concerns.  It was a pleasure to see you and your family again today!  Websites that have reliable patient information: 1. American Academy of Asthma, Allergy, and Immunology: www.aaaai.org 2. Food Allergy Research and  Education (FARE): foodallergy.org 3. Mothers of Asthmatics: http://www.asthmacommunitynetwork.org 4. American College of Allergy, Asthma, and Immunology: www.acaai.org   COVID-19 Vaccine Information can be found at: 06/06/2020 For questions related to vaccine distribution or appointments, please email [email or web address] or call [phone].     "Like" [place] on Facebook and Instagram for our latest updates!     HAPPY FALL!     Make sure you are registered to vote! If you have moved or changed any of your contact information, you will need to get this updated before voting!  In some cases, you MAY be able to register to vote online: [place]

## 2020-01-07 NOTE — Progress Notes (Signed)
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 Severe persistent asthma, uncomplicated  - Lung testing deferred today.  - You are doing very well!  - We will not make any medication changes today. - But we may change the medicine after your Medicaid runs out in February. - Daily controller medication(s): Singulair 10mg  daily and Symbicort 160/4.56mcg two puffs twice daily with spacer - Prior to physical activity: albuterol 2 puffs 10-15 minutes before physical activity. - Rescue medications: albuterol 4 puffs every 4-6 hours as needed - Asthma control goals:  * Full participation in all desired activities (may need albuterol before activity) * Albuterol use two time or less a week on average (not counting use with activity) * Cough interfering with sleep two time or less a month * Oral steroids no more than once a year * No hospitalizations  2. Perennial allergic rhinitis (cockroach) - Continue with cetirizine 10mg  daily as needed. - Continue with Rhinocort one spray per nostril daily as needed.  - Continue with montelukast 10mg  daily.   3 Cholinergic urticaria - Continue with Xyzal 5mg  daily during warmer weather months  4. Food allergy  - Continue avoidance of shellfish  - Have access to self-injectable epinephrine Epipen 0.3mg  at all times  - Follow emergency action plan in case of allergic reaction   5. Drug allergy - Continue with avoidance of Tylenol (acetominphen)   6. Return in about 3 months (around 04/08/2020).   Subjective:   Dawn Haney is a 34 y.o. female presenting today for follow up of  Chief Complaint  Patient presents with  . Asthma     No concerns at this time    Dawn Haney has a history of the following: Patient Active Problem List   Diagnosis Date Noted  . Postpartum care following vaginal delivery 05/21/2019  . Nexplanon in place 05/21/2019  . Language barrier 02/11/2019  . Alpha thalassemia silent carrier 02/07/2019    History obtained from: chart review and patient.  Dawn Haney is a 34 y.o. female presenting for a follow up visit.  She was last seen in July 2021.  At that time, we did recommend using her controllers on a more consistent basis.  We continued with Singulair as well as Symbicort 2 puffs twice daily.  She does have albuterol to use as needed.  For her rhinitis, we continued with cetirizine as well as Rhinocort and montelukast.  She was on Xyzal 5 mg daily for her cholinergic urticaria control.  We recommended continued avoidance of shellfish and made sure her EpiPen was up-to-date.  We also recommended continued avoidance of Tylenol.   Since the last visit, she has done very well. She has been more compliant with her medications.   Asthma/Respiratory Symptom History: She remains on the Symbicort two puffs twice daily with a spacer.  Hollis's asthma has been well controlled. She has not required rescue medication, experienced nocturnal awakenings due to lower respiratory symptoms, nor have activities of daily living been limited. She has required no Emergency Department or Urgent Care visits for her asthma. She has required zero courses of systemic steroids for asthma exacerbations since the last visit. ACT score today is 24, indicating excellent asthma symptom control.   Allergic Rhinitis  Symptom History: She has been using her cetirizine and montelukast. She is not using her Rhinocort on a daily basis. She has not needed antibiotics at all since the last visit.  Food Allergy Symptom History: She denies any shellfish exposure. She has not needed to use her EpiPen at all.    Urticaria Symptom  History: She has had no problems with her urticaria at all with the Xyzal. She has not needed any epinephrine for throat swelling.    Otherwise, there have been no changes to her past medical history, surgical history, family history, or social history.    Review of Systems  Constitutional: Negative.  Negative for chills, fever, malaise/fatigue and weight loss.  HENT: Negative.  Negative for congestion, ear discharge, ear pain and sore throat.   Eyes: Negative for pain, discharge and redness.  Respiratory: Negative for cough, sputum production, shortness of breath and wheezing.   Cardiovascular: Negative.  Negative for chest pain and palpitations.  Gastrointestinal: Negative for abdominal pain, blood in stool, constipation, diarrhea, heartburn, nausea and vomiting.  Skin: Negative.  Negative for itching and rash.  Neurological: Negative for dizziness and headaches.  Endo/Heme/Allergies: Negative for environmental allergies. Does not bruise/bleed easily.       Objective:   Blood pressure 98/82, pulse 90, temperature 98.1 F (36.7 C), resp. rate 14, height 5' (1.524 m), weight 120 lb 9.6 oz (54.7 kg), SpO2 96 %, unknown if currently breastfeeding. Body mass index is 23.55 kg/m.   Physical Exam:  Physical Exam Constitutional:      Appearance: She is well-developed.     Comments: Very pleasant and cooperative with the exam.   HENT:     Head: Normocephalic and atraumatic.     Right Ear: Tympanic membrane, ear canal and external ear normal.     Left Ear: Tympanic membrane, ear canal and external ear normal.     Nose: No nasal deformity, septal deviation, mucosal edema or rhinorrhea.     Right Turbinates: Enlarged and swollen.     Left Turbinates: Enlarged and swollen.     Right Sinus: No maxillary sinus tenderness or frontal sinus tenderness.     Left Sinus: No maxillary sinus tenderness or frontal sinus tenderness.     Mouth/Throat:     Mouth: Mucous membranes are not pale and  not dry.     Pharynx: Uvula midline.     Comments: Cobblestoning present in the posterior oropharynx.  Eyes:     General:        Right eye: No discharge.        Left eye: No discharge.     Conjunctiva/sclera: Conjunctivae normal.     Right eye: Right conjunctiva is not injected. No chemosis.    Left eye: Left conjunctiva is not injected. No chemosis.    Pupils: Pupils are equal, round, and reactive to light.  Cardiovascular:     Rate and Rhythm: Normal rate and regular rhythm.     Heart sounds: Normal heart sounds.  Pulmonary:     Effort: Pulmonary effort is normal. No tachypnea, accessory muscle usage or respiratory distress.     Breath sounds: Normal breath sounds. No wheezing, rhonchi or rales.     Comments: Moving air well in all lung fields.  Chest:     Chest wall: No tenderness.  Lymphadenopathy:     Cervical: No cervical adenopathy.  Skin:    Coloration: Skin is not pale.     Findings: No abrasion, erythema, petechiae or rash. Rash  is not papular, urticarial or vesicular.  Neurological:     Mental Status: She is alert.      Diagnostic studies: none       Malachi Bonds, MD  [facility]

## 2020-02-01 IMAGING — CT CT ANGIO CHEST
2 of 7 series · 18 of 46 positions shown · IV contrast (APPLIED)
Comparison: None.

CLINICAL DATA: Shortness of breath for 3 days. History of asthma
and third trimester pregnancy.

EXAM:
CT ANGIOGRAPHY CHEST WITH CONTRAST
TECHNIQUE: Multidetector CT imaging of the chest was performed using the
standard protocol during bolus administration of intravenous
contrast. Multiplanar CT image reconstructions and MIPs were
obtained to evaluate the vascular anatomy.
CONTRAST:  75mL OMNIPAQUE IOHEXOL 350 MG/ML SOLN

[Series 7: thins · axial · 0.74mm/px · z∈[+1352,+1588]mm · 15 of 379 slices shown]
[im 22/379  lung]
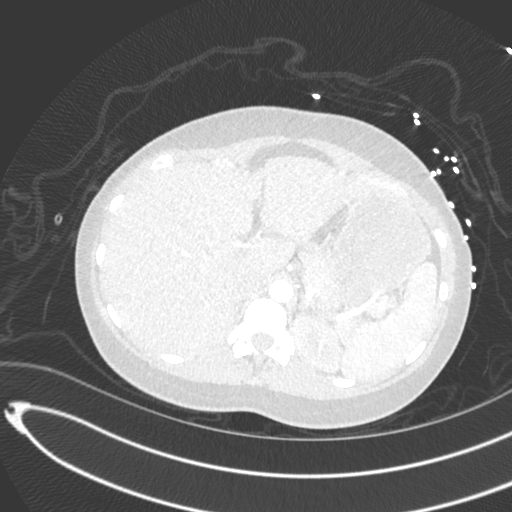
[im 43/379  soft-tissue]
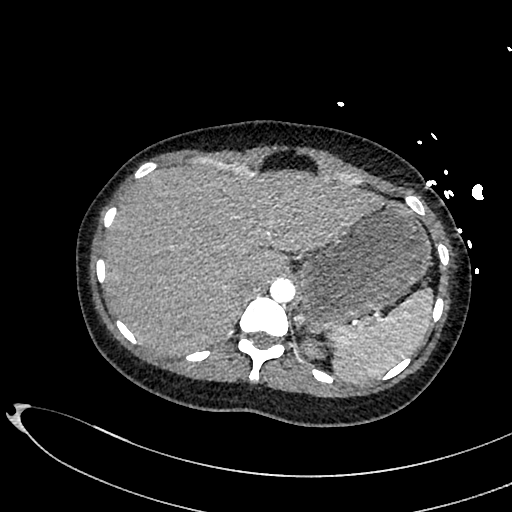
[im 64/379  lung]
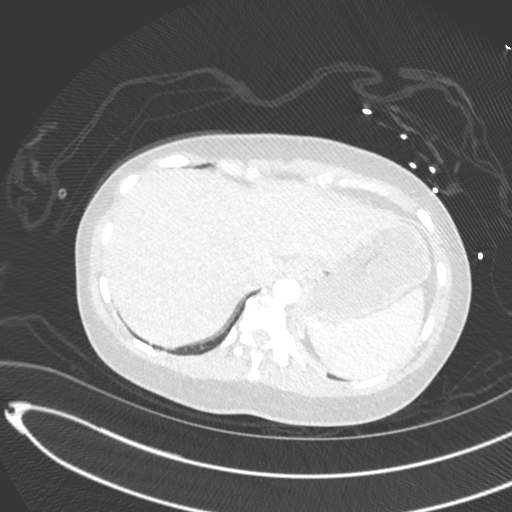
[im 85/379  soft-tissue]
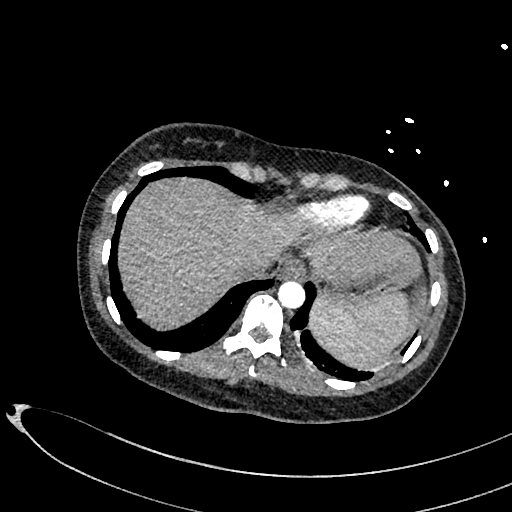
[im 127/379  lung]
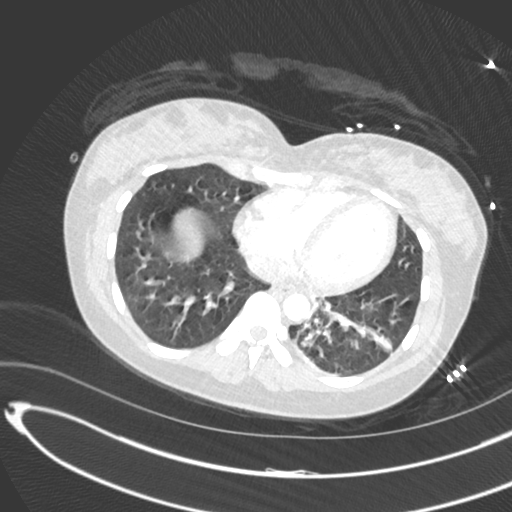
[im 148/379  soft-tissue]
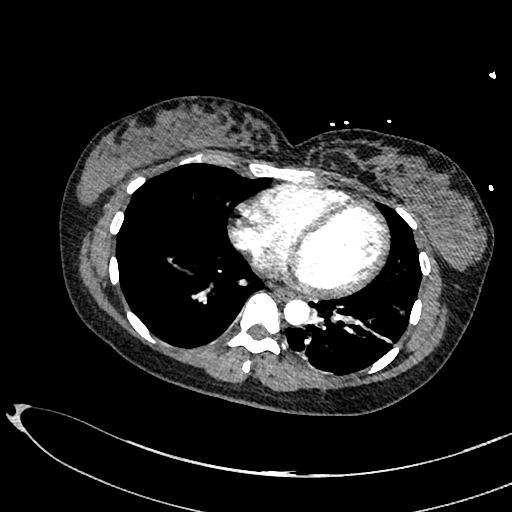
[im 169/379  lung]
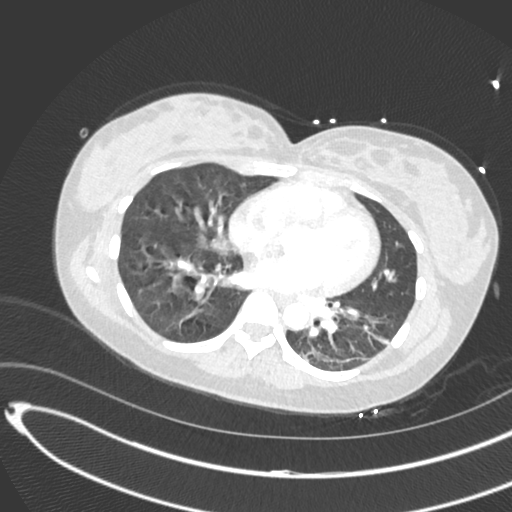
[im 190/379  soft-tissue]
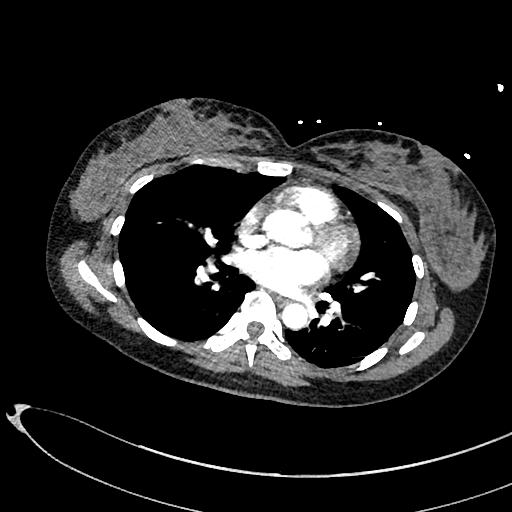
[im 211/379  lung]
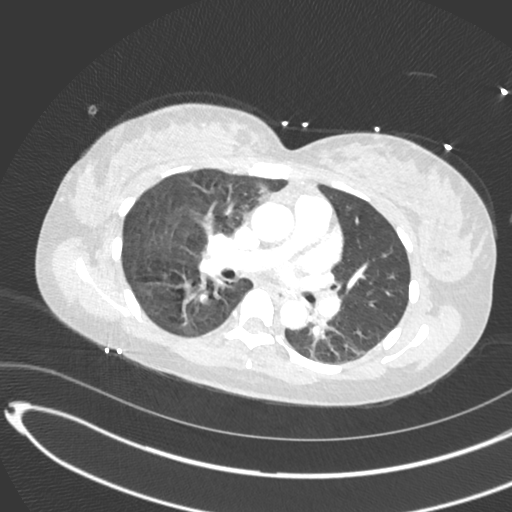
[im 232/379  soft-tissue]
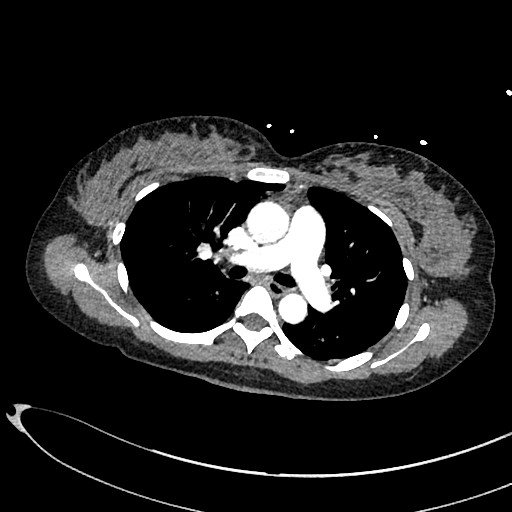
[im 253/379  lung]
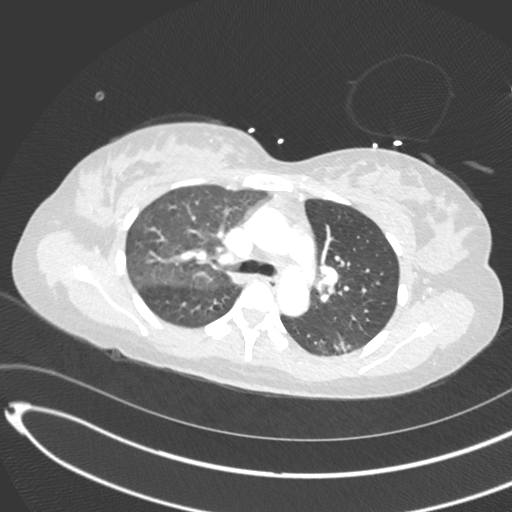
[im 295/379  soft-tissue]
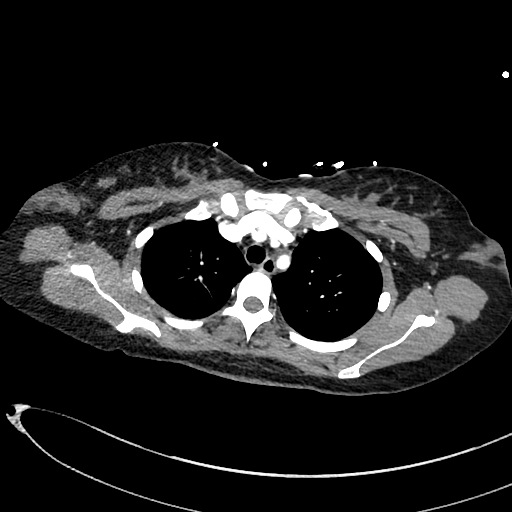
[im 316/379  lung]
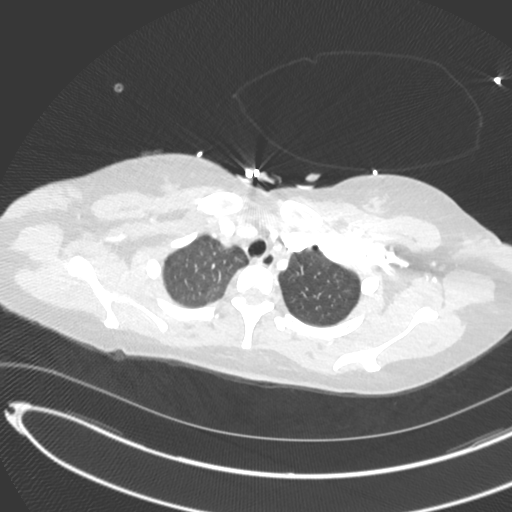
[im 337/379  soft-tissue]
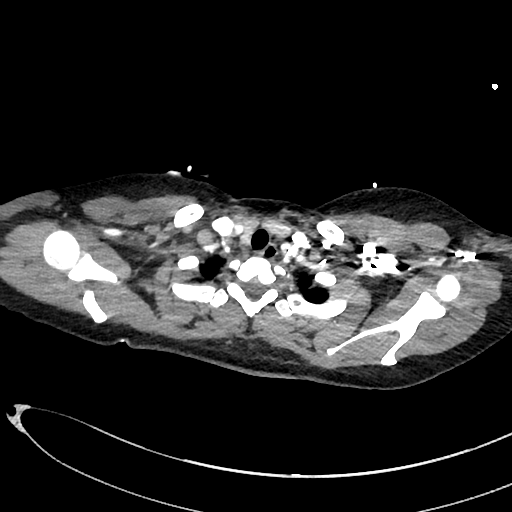
[im 358/379  lung]
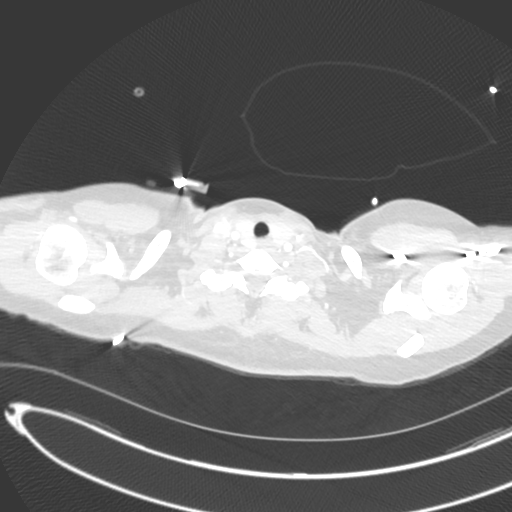

[Series 8: cor · coronal · 0.55mm/px · 3 of 116 slices shown]
[im 29/116  soft-tissue]
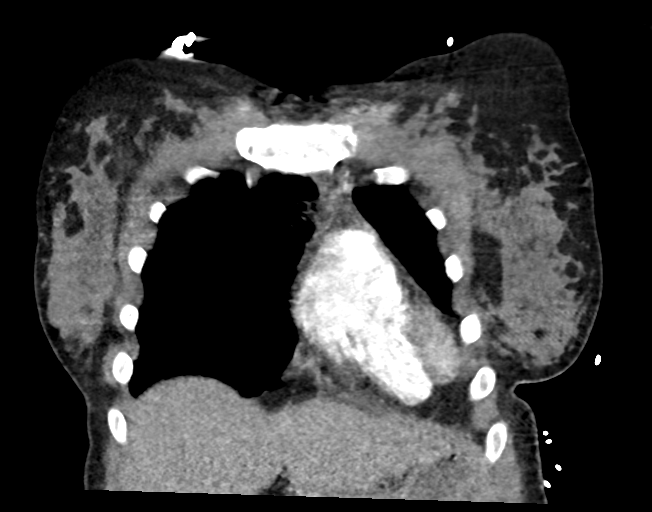
[im 58/116  soft-tissue]
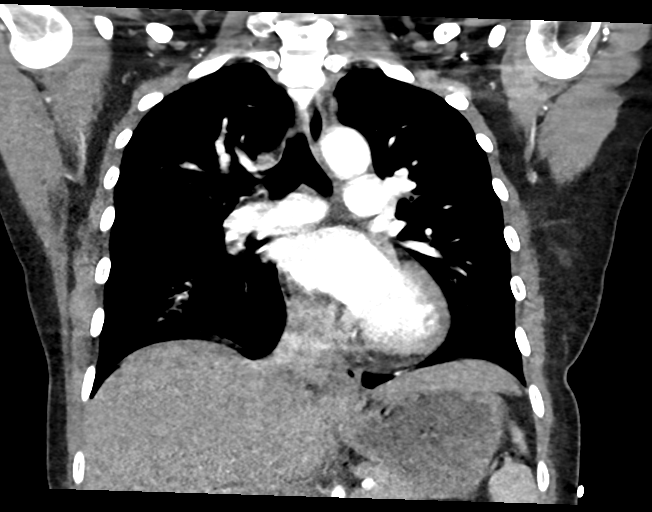
[im 87/116  soft-tissue]
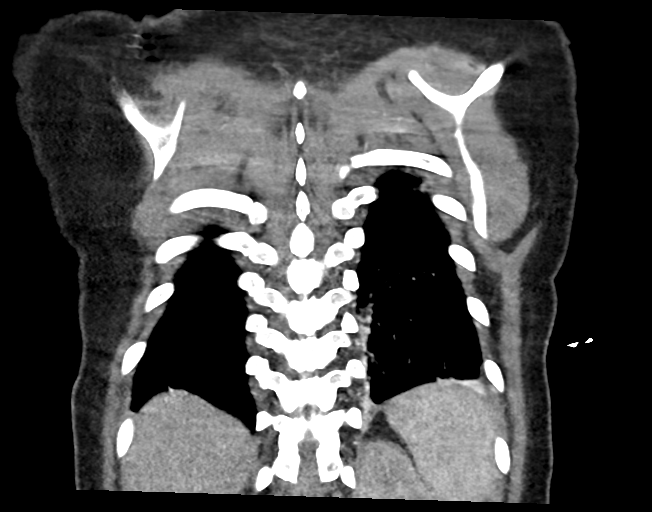

[18 of 46 positions shown; findings below may reference images not displayed]

FINDINGS: Cardiovascular: Adequate pulmonary artery opacification but
significantly limited by motion artifact. No evidence of pulmonary
embolism. Normal heart size. Normal aorta.

Mediastinum/Nodes: Remote granulomatous calcification at the left
hilum and mediastinum.

Lungs/Pleura: Streaky atelectasis in the left lower lobe with airway
narrowings. Small focus of airspace opacity in the right upper lobe.
Asymmetric upper lobe aeration, more lucent on the left.

Upper Abdomen: Negative

Musculoskeletal: Negative

Review of the MIP images confirms the above findings.
IMPRESSION: 1. Airway thickening with patchy atelectasis and small focus of
right upper lobe airspace disease. Findings correlate with history
of asthma; there could be superimposed infectious bronchitis.
2. Respiratory motion limits pulmonary artery evaluation. No
evidence of pulmonary embolism.
3. Remote granulomatous disease.

## 2020-02-05 ENCOUNTER — Encounter (HOSPITAL_COMMUNITY): Payer: Self-pay | Admitting: *Deleted

## 2020-02-05 ENCOUNTER — Other Ambulatory Visit: Payer: Self-pay

## 2020-02-05 ENCOUNTER — Ambulatory Visit (HOSPITAL_COMMUNITY)
Admission: EM | Admit: 2020-02-05 | Discharge: 2020-02-05 | Disposition: A | Discharge status: Home / Self Care | Payer: Medicaid Other | Attending: Family Medicine | Admitting: Family Medicine

## 2020-02-05 DIAGNOSIS — J069 Acute upper respiratory infection, unspecified: Secondary | ICD-10-CM | POA: Diagnosis not present

## 2020-02-05 DIAGNOSIS — U071 COVID-19: Secondary | ICD-10-CM | POA: Insufficient documentation

## 2020-02-05 MED ORDER — DM-GUAIFENESIN ER 30-600 MG PO TB12: 2.0000 | ORAL_TABLET | Freq: Two times a day (BID) | ORAL | 0 refills | Status: AC

## 2020-02-05 NOTE — ED Triage Notes (Signed)
Pt reports 2 days ago sx's started Cough,sore throatand nasal congestion. Pt reports having a mild HA.

## 2020-02-05 NOTE — Discharge Instructions (Signed)
Push fluids to ensure adequate hydration and keep secretions thin.  Tylenol and/or ibuprofen as needed for pain or fevers.  Mucinex d twice a day to help with your symptoms.  Use of your inhalers as needed for wheezing.  Self isolate until covid results are back and negative.  Will notify you by phone of any positive findings. Your negative results will be sent through your MyChart.     If symptoms worsen or do not improve in the next week to return to be seen or to follow up with your PCP.

## 2020-02-05 NOTE — ED Provider Notes (Signed)
[place]    CSN: [id number] Arrival date & time: 02/05/20  1530      History   Chief Complaint Chief Complaint  Patient presents with  . Cough  . Sore Throat  . Nasal Congestion    HPI Dawn Haney is a 34 y.o. female.   Dawn Haney presents with complaints of cough and congestion which started yesterday. No fevers. No gi symptoms. Mild sore throat. No ear pain. No shortness of breath. Children also have had URI symptoms, which started last week. History of asthma. Has inhalers and nebulizers for prn use.    ROS per HPI, negative if not otherwise mentioned.      Past Medical History:  Diagnosis Date  . Acute respiratory failure with hypoxia ([place]) 03/07/2019  . Asthma   . Asthma affecting pregnancy in third trimester 03/07/2019  . Bronchitis with asthma, acute 03/08/2019  . Bronchitis with asthma, acute 03/08/2019  . Cholestasis during pregnancy   . Cholestasis of pregnancy 04/20/2019  . Cholestasis of pregnancy, antepartum 01/17/2019   Cholestasis - O26.619, K83.1 Q 4 wks 28 37 or as per MFM   . Placenta previa 03/07/2019   02/21/19: posterior previa 03/04/19: resolved  . Previous preterm delivery, antepartum 01/14/2019   SVD at 31 weeks in 2012  . Supervision of high risk pregnancy, antepartum 01/08/2019    Nursing Staff Provider Office Location  [facility] Dating   LMP Language    Karen/English  Anatomy US   Flu Vaccine   Given 04/11/19 Genetic Screen  NIPS: low risks   AFP:   First Screen:  Quad:   TDaP vaccine   Declined Hgb A1C or  GTT Early  Third trimester: normal 2hr GTT  Rhogam     LAB RESULTS  Feeding Plan  undecided Blood Type B/Positive/-- (11/09 1005)  Contraception  Nexplanon Antibody Negativ    Patient Active Problem List   Diagnosis Date Noted  . Postpartum care following vaginal delivery 05/21/2019  . Nexplanon in place 05/21/2019  . Language barrier 02/11/2019  . Alpha thalassemia silent carrier 02/07/2019    Past Surgical  History:  Procedure Laterality Date  . NO PAST SURGERIES      OB History    Gravida  3   Para  3   Term  1   Preterm  2   AB  0   Living  3     SAB  0   TAB  0   Ectopic  0   Multiple  0   Live Births  3            Home Medications    Prior to Admission medications   Medication Sig Start Date End Date Taking? Authorizing Provider  albuterol (PROVENTIL) (2.5 MG/3ML) 0.083% nebulizer solution Take 3 mLs (2.5 mg total) by nebulization every 6 (six) hours as needed for wheezing or shortness of breath. 01/07/20  Yes Valentina Shaggy, MD  albuterol (VENTOLIN HFA) 108 (90 Base) MCG/ACT inhaler Inhale 2 puffs into the lungs every 6 (six) hours as needed for wheezing or shortness of breath. 05/24/19  Yes Padgett, Rae Halsted, MD  budesonide-formoterol [facility]) 160-4.5 MCG/ACT inhaler Inhale 2 puffs into the lungs 2 (two) times daily. 01/07/20  Yes Valentina Shaggy, MD  montelukast (SINGULAIR) 10 MG tablet Take 1 tablet (10 mg total) by mouth at bedtime. 01/07/20  Yes Valentina Shaggy, MD  Prenatal Vit-Fe Fumarate-FA (MULTIVITAMIN-PRENATAL) 27-0.8 MG TABS tablet Take 1 tablet by mouth daily  at 12 noon. 05/21/19  Yes Chancy Milroy, MD  Blood Pressure Monitoring (BLOOD PRESSURE KIT) DEVI 1 kit by Does not apply route once a week. Check BP regularly and record readings into the [employer] App.  Large Cuff.  Dx O90.0 Patient not taking: Reported on 01/07/2020 03/21/19   Sloan Leiter, MD  dextromethorphan-guaiFENesin [facility] DM) 30-600 MG 12hr tablet Take 2 tablets by mouth 2 (two) times daily for 5 days. 02/05/20 02/10/20  Augusto Gamble B, NP  EPINEPHrine 0.3 mg/0.3 mL IJ SOAJ injection Inject into the muscle. 07/05/19   [provider]  ursodiol (ACTIGALL) 500 MG tablet Take 500 mg by mouth 2 (two) times daily. Patient not taking: Reported on 01/07/2020 05/08/19   [provider]    Family History Family History  Problem Relation Age of  Onset  . Asthma Neg Hx   . Allergies Neg Hx   . Eczema Neg Hx     Social History Social History   Tobacco Use  . Smoking status: Never Smoker  . Smokeless tobacco: Never Used  Vaping Use  . Vaping Use: Never used  Substance Use Topics  . Alcohol use: No  . Drug use: No     Allergies   Shrimp [shellfish allergy] and Tylenol [acetaminophen]   Review of Systems Review of Systems   Physical Exam Triage Vital Signs ED Triage Vitals  Enc Vitals Group     BP 02/05/20 1738 98/69     Pulse Rate 02/05/20 1738 75     Resp 02/05/20 1738 18     Temp 02/05/20 1738 98 F (36.7 C)     Temp Source 02/05/20 1738 Oral     SpO2 02/05/20 1738 96 %     Weight 02/05/20 1739 119 lb (54 kg)     Height 02/05/20 1739 5' (1.524 m)     Head Circumference --      Peak Flow --      Pain Score 02/05/20 1739 0     Pain Loc --      Pain Edu? --      Excl. in [place]? --    No data found.  Updated Vital Signs BP 98/69 (BP Location: Left Arm)   Pulse 75   Temp 98 F (36.7 C) (Oral)   Resp 18   Ht 5' (1.524 m)   Wt 119 lb (54 kg)   LMP  (LMP Unknown)   SpO2 96%   Breastfeeding No   BMI 23.24 kg/m   Visual Acuity Right Eye Distance:   Left Eye Distance:   Bilateral Distance:    Right Eye Near:   Left Eye Near:    Bilateral Near:     Physical Exam Constitutional:      General: She is not in acute distress.    Appearance: She is well-developed.  HENT:     Nose: Rhinorrhea present.  Cardiovascular:     Rate and Rhythm: Normal rate.  Pulmonary:     Effort: Pulmonary effort is normal.     Breath sounds: No wheezing.  Skin:    General: Skin is warm and dry.  Neurological:     Mental Status: She is alert and oriented to person, place, and time.      UC Treatments / Results  Labs (all labs ordered are listed, but only abnormal results are displayed) Labs Reviewed  SARS CORONAVIRUS 2 (TAT 6-24 HRS)    EKG   Radiology No results found.  Procedures Procedures  (  including critical care time)  Medications Ordered in UC Medications - No data to display  Initial Impression / Assessment and Plan / UC Course  I have reviewed the triage vital signs and the nursing notes.  Pertinent labs & imaging results that were available during my care of the patient were reviewed by me and considered in my medical decision making (see chart for details).     Non toxic. Benign physical exam. No work of breathing or shortness of breath . Vitals stable. History and physical consistent with viral illness.  Supportive cares recommended. Return precautions provided. Patient verbalized understanding and agreeable to plan.    Final Clinical Impressions(s) / UC Diagnoses   Final diagnoses:  Upper respiratory tract infection, unspecified type     Discharge Instructions     Push fluids to ensure adequate hydration and keep secretions thin.  Tylenol and/or ibuprofen as needed for pain or fevers.  Mucinex d twice a day to help with your symptoms.  Use of your inhalers as needed for wheezing.  Self isolate until covid results are back and negative.  Will notify you by phone of any positive findings. Your negative results will be sent through your MyChart.     If symptoms worsen or do not improve in the next week to return to be seen or to follow up with your PCP.       ED Prescriptions    Medication Sig Dispense Auth. Provider   dextromethorphan-guaiFENesin (MUCINEX DM) 30-600 MG 12hr tablet Take 2 tablets by mouth 2 (two) times daily for 5 days. 20 tablet Zigmund Gottron, NP     PDMP not reviewed this encounter.   Zigmund Gottron, NP 02/05/20 1920

## 2020-02-06 LAB — SARS CORONAVIRUS 2 (TAT 6-24 HRS): SARS Coronavirus 2: POSITIVE — AB

## 2020-02-07 ENCOUNTER — Telehealth: Payer: Self-pay | Admitting: Physician Assistant

## 2020-02-07 NOTE — Telephone Encounter (Signed)
Called to discuss with Dawn Haney about Covid symptoms and the use of sotrovimab, casirivimab/imdevimab or bamlanivimab/etesevimab infusion for those with mild to moderate Covid symptoms and at a high risk of hospitalization.     Pt is qualified for this infusion at the monoclonal antibody infusion center due to co-morbid conditions and/or a member of an at-risk group, however declines infusion at this time. Symptoms tier reviewed as well as criteria for ending isolation.  Symptoms reviewed that would warrant ED/Hospital evaluation. Preventative practices reviewed. Patient verbalized understanding. Patient advised to call back if he decides that he does want to get infusion. Callback number to the infusion center given. Patient advised to go to Urgent care or ED with severe symptoms. Last date pt would be eligible for infusion is 12/10.     Patient Active Problem List   Diagnosis Date Noted  . Postpartum care following vaginal delivery 05/21/2019  . Nexplanon in place 05/21/2019  . Language barrier 02/11/2019  . Alpha thalassemia silent carrier 02/07/2019    Cline Crock PA-C

## 2020-03-04 IMAGING — US US MFM FETAL BPP W/O NON-STRESS
1 series · 12 of 14 positions shown · non-contrast
Comparison: none

[Series 1: us mfm fetal bpp w/o non-stress · 14 acquisitions, 12 frames shown]
[im 1/14]
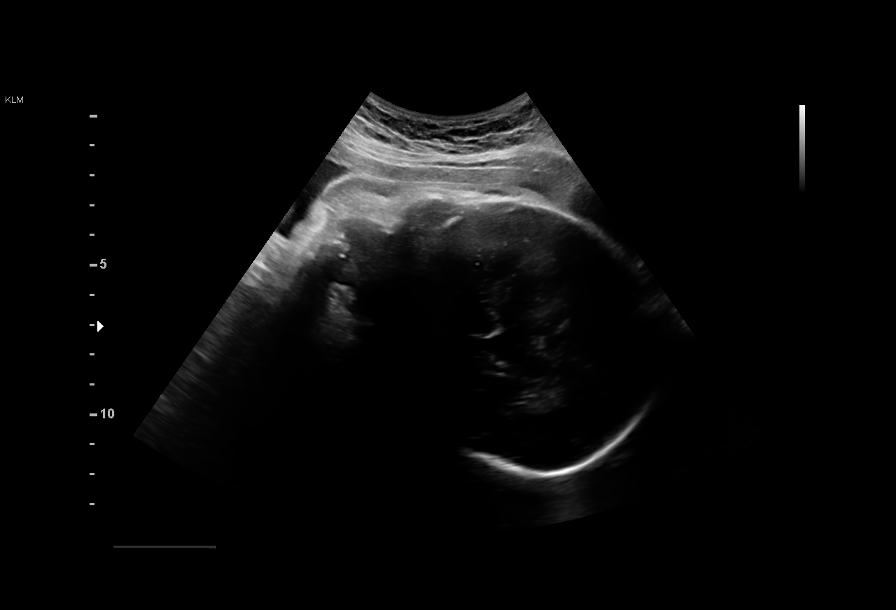
[im 2/14]
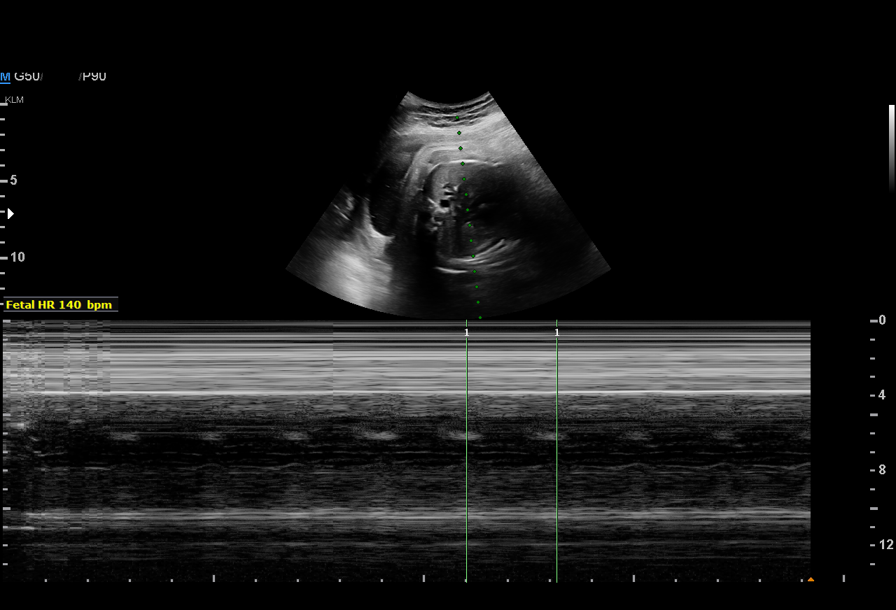
[im 3/14]
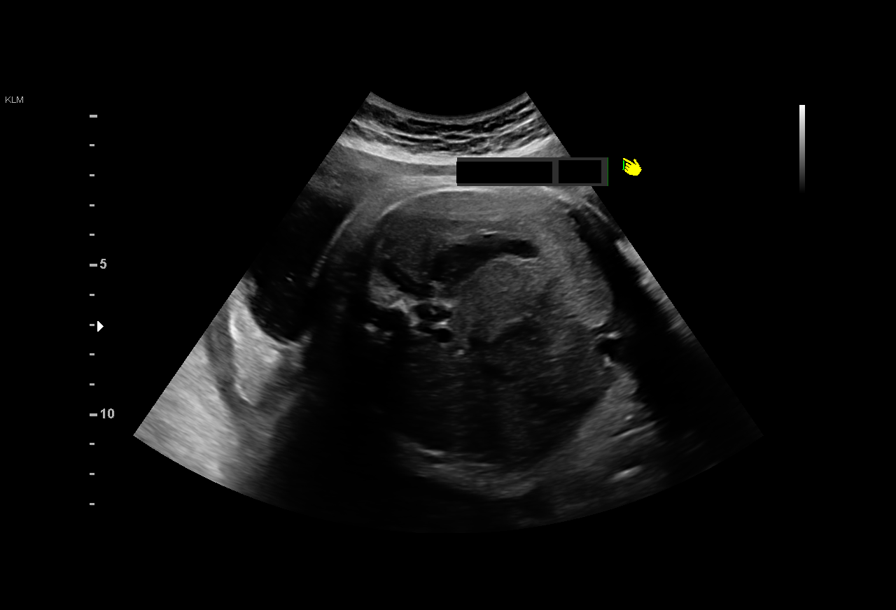
[im 5/14]
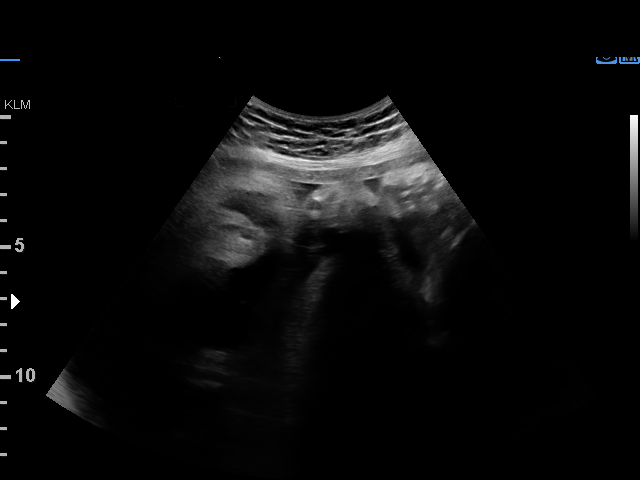
[im 6/14]
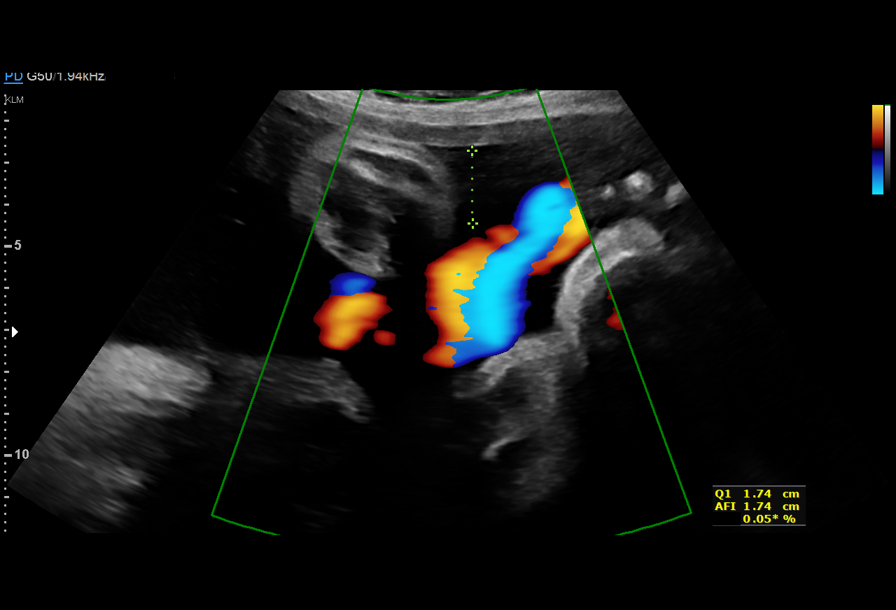
[im 7/14]
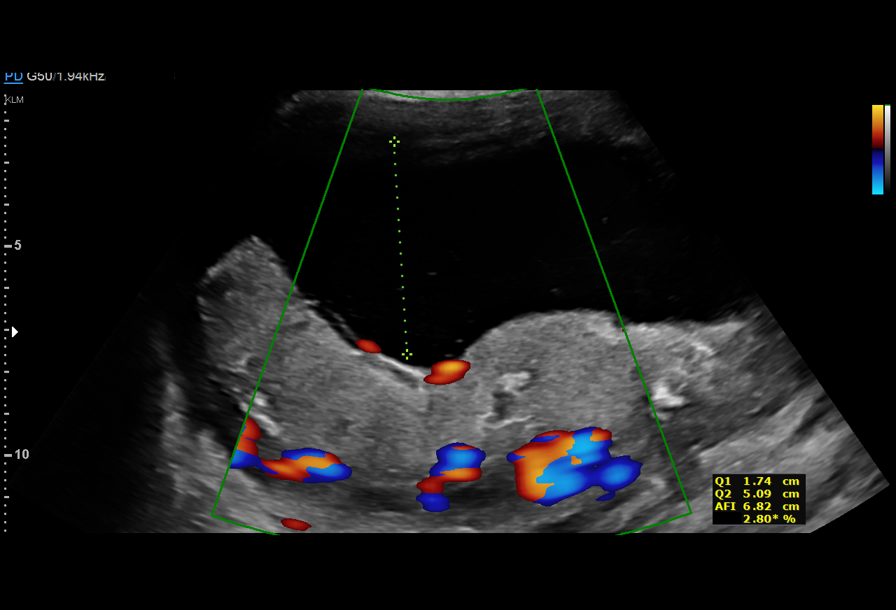
[im 8/14]
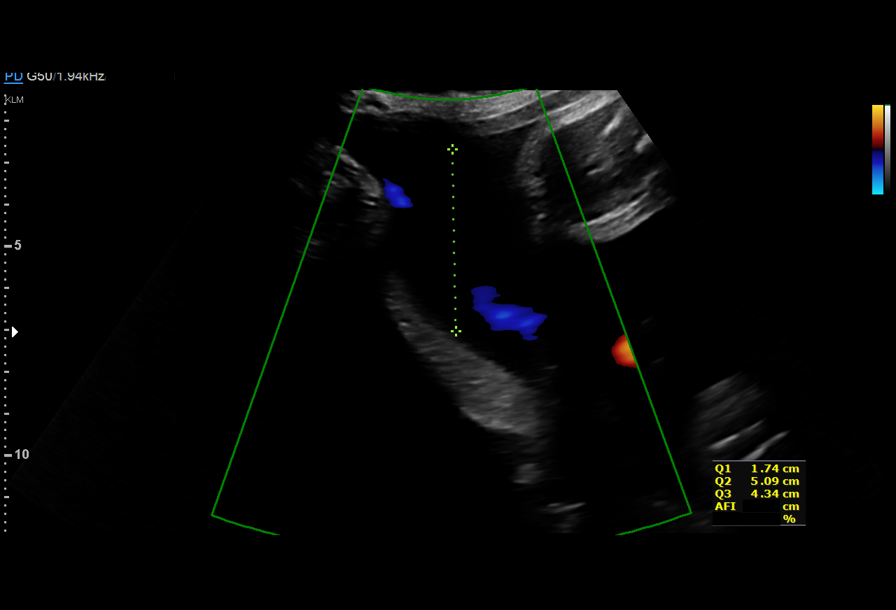
[im 9/14]
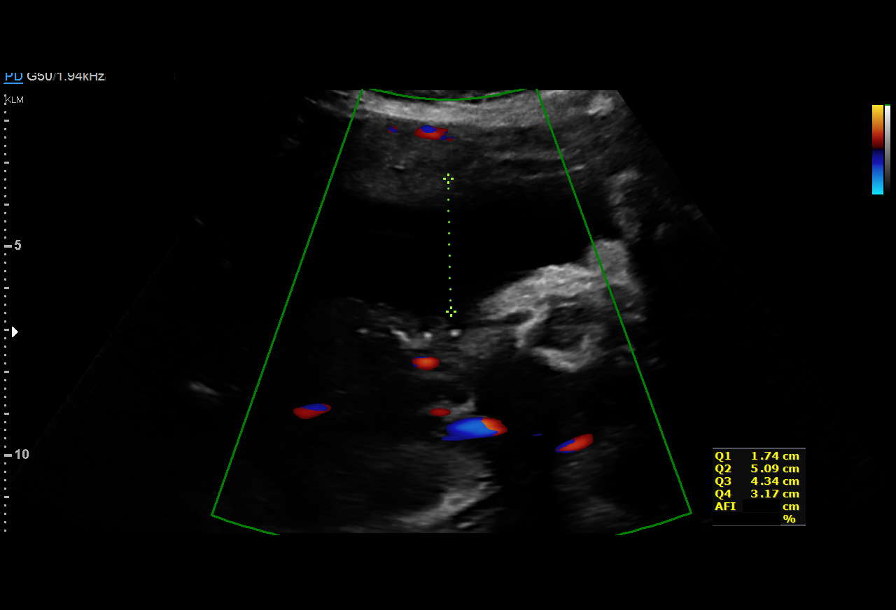
[im 10/14]
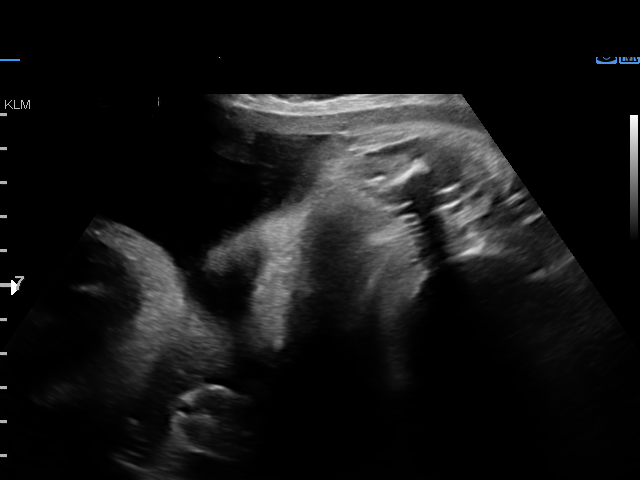
[im 12/14]
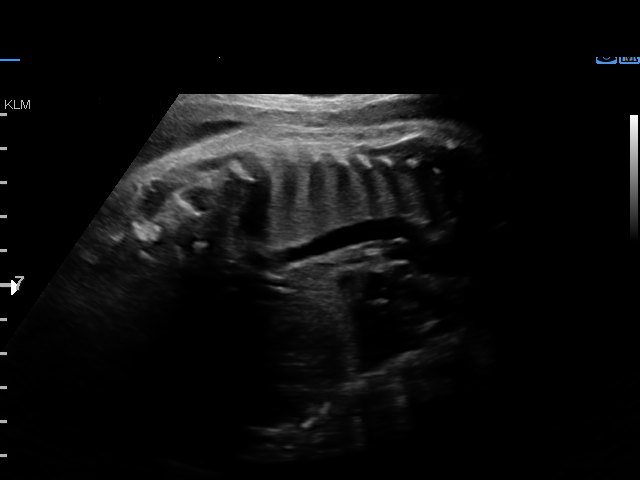
[im 13/14]
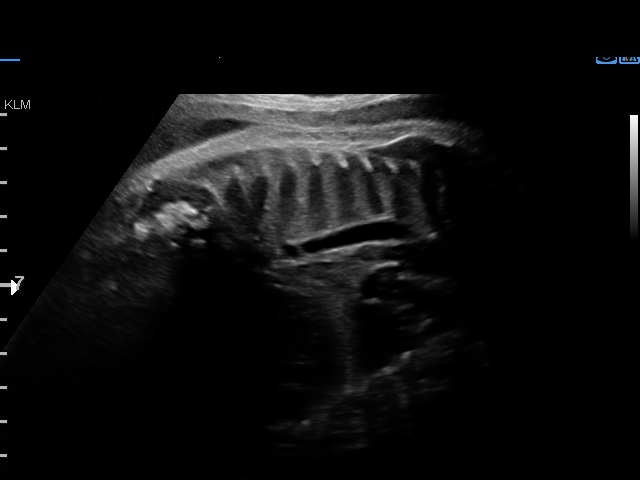
[im 14/14]
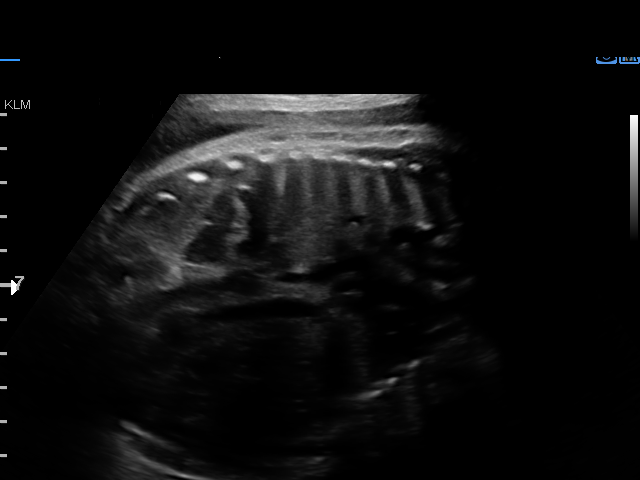

[12 of 14 positions shown; findings below may reference images not displayed]

----------------------------------------------------------------------

 ----------------------------------------------------------------------
Indications

  Cholestasis of pregnancy, third trimester      [id number]
  35 weeks gestation of pregnancy
  Late to prenatal care, third trimester
  Placenta previa specified as without
  hemorrhage, third trimester (resolved)
 ----------------------------------------------------------------------
Vital Signs

                                                Height:        5'0"
Fetal Evaluation

 Num Of Fetuses:         1
 Fetal Heart Rate(bpm):  140
 Cardiac Activity:       Observed
 Presentation:           Cephalic

 Amniotic Fluid
 AFI FV:      Within normal limits

 AFI Sum(cm)     %Tile       Largest Pocket(cm)
 14.34           52

 RUQ(cm)       RLQ(cm)       LUQ(cm)        LLQ(cm)

Biophysical Evaluation

 Amniotic F.V:   Within normal limits       F. Tone:        Observed
 F. Movement:    Observed                   Score:          [DATE]
 F. Breathing:   Observed
OB History

 Gravidity:    3         Term:   2        Prem:   0        SAB:   0
 TOP:          0       Ectopic:  0        Living: 2
Gestational Age

 LMP:           35w 3d        Date:  08/03/18                 EDD:   05/10/19
 Best:          35w 3d     Det. By:  LMP  (08/03/18)          EDD:   05/10/19
Anatomy

 Stomach:               Appears normal, left   Bladder:                Appears normal
                        sided
Comments

 A biophysical profile performed today due to cholestasis of
 pregnancywas [DATE].
 There was normal amniotic fluid noted on today's ultrasound
 exam.
 A follow-up exam was scheduled in 1 week.

## 2020-03-11 IMAGING — US US MFM OB FOLLOW-UP
1 series · 14 of 28 positions shown · non-contrast
Comparison: none

[Series 1: us mfm ob follow-up · 50 acquisitions, 14 frames shown]
[im 2/50]
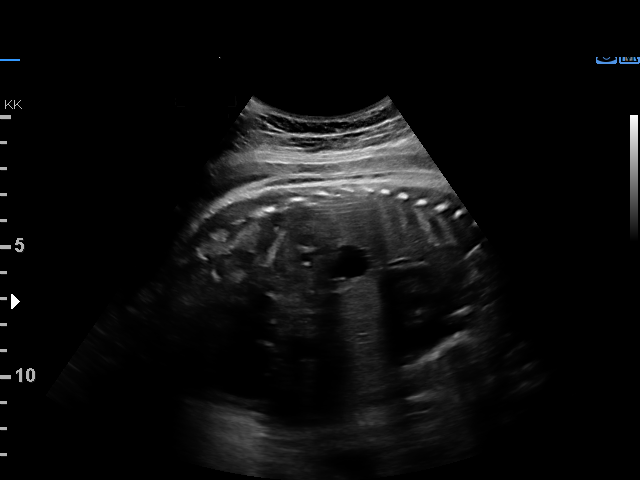
[im 6/50]
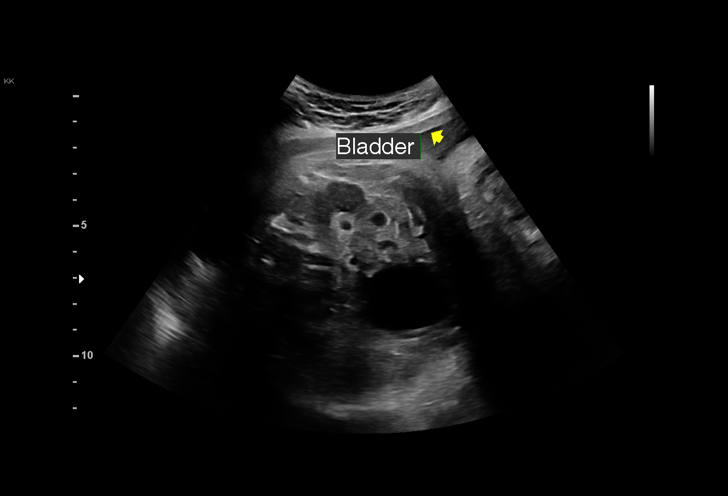
[im 10/50]
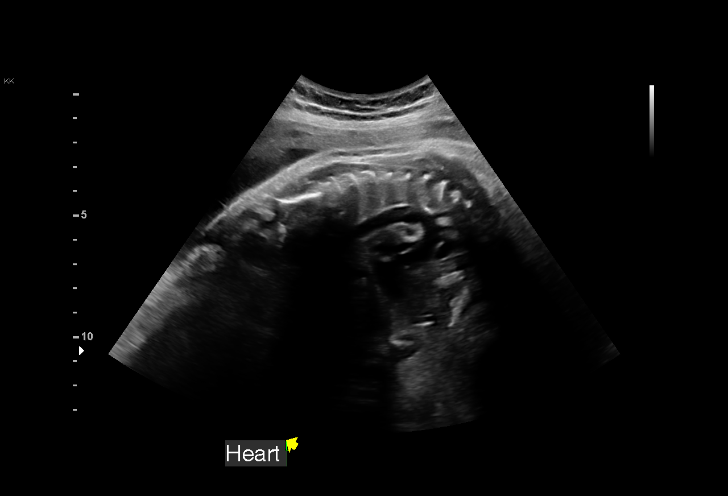
[im 13/50]
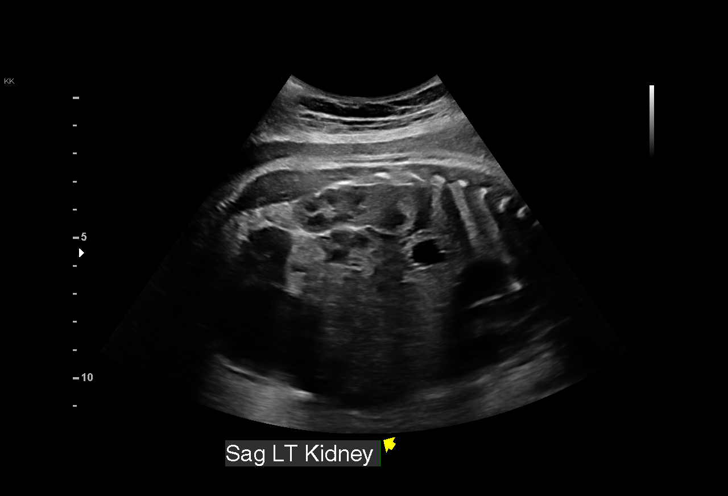
[im 17/50]
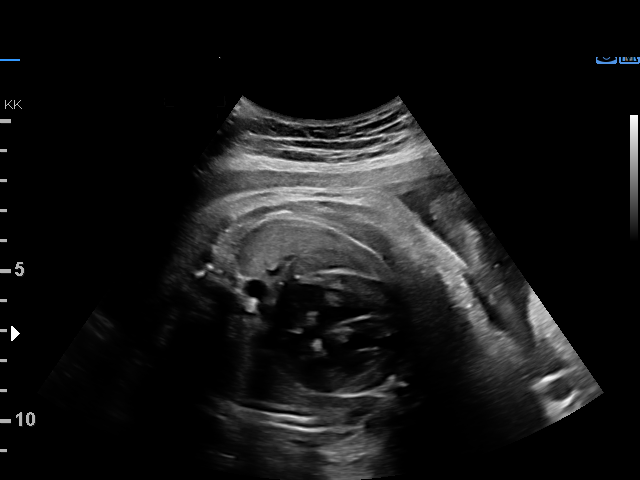
[im 20/50]
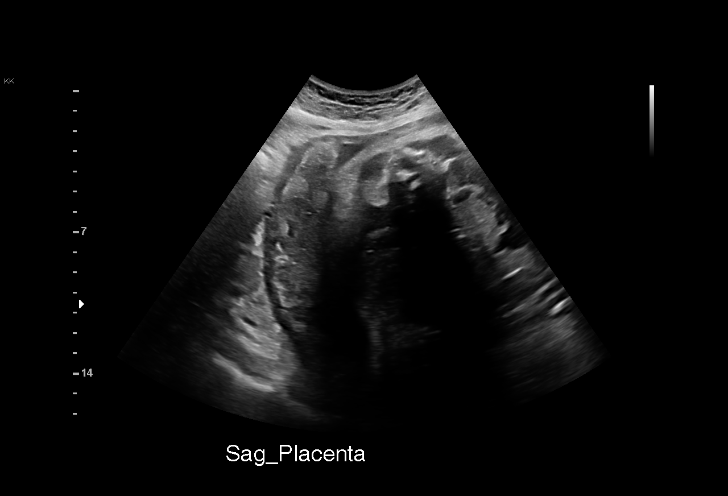
[im 24/50]
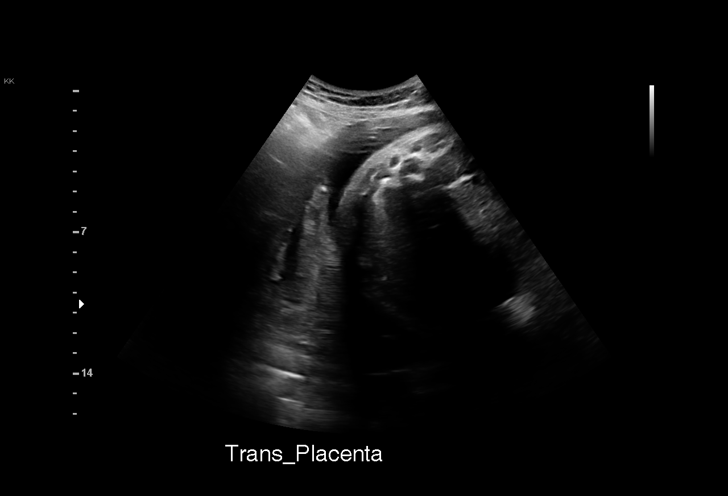
[im 28/50]
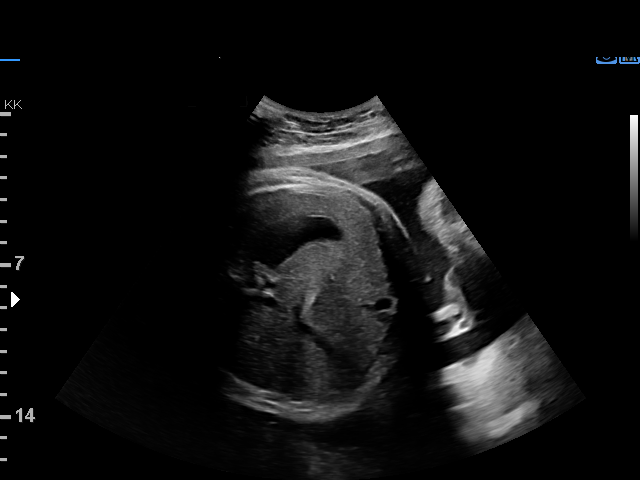
[im 31/50]
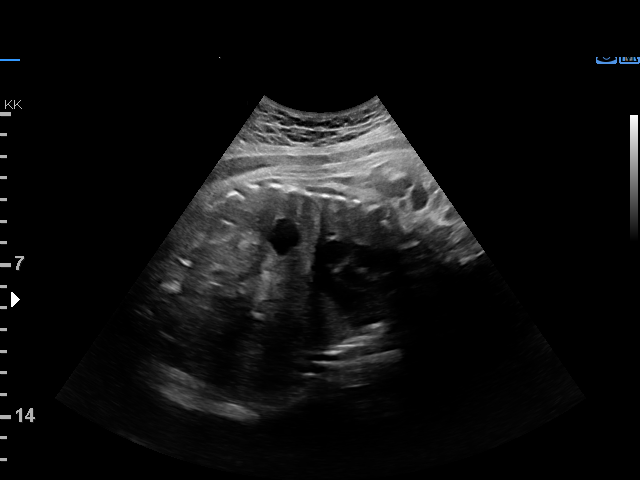
[im 35/50]
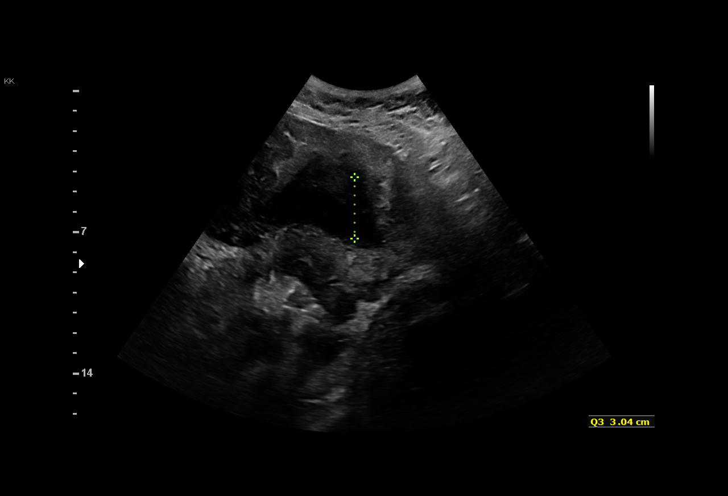
[im 39/50]
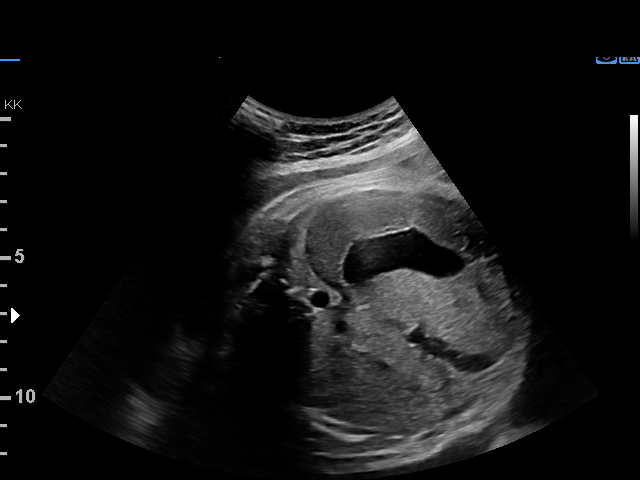
[im 42/50]
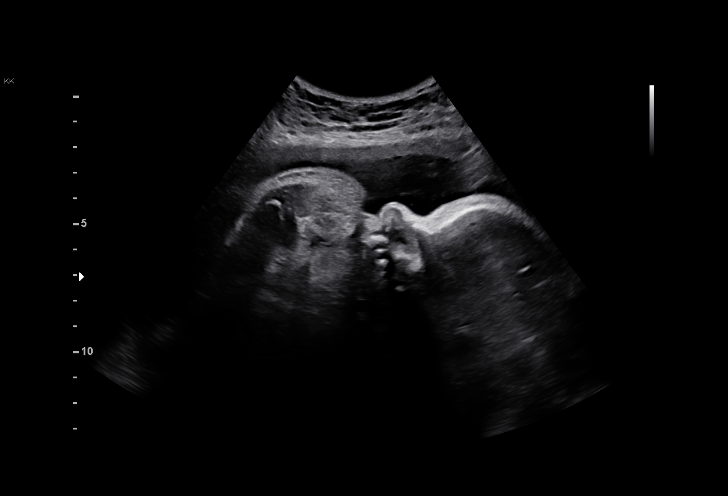
[im 46/50]
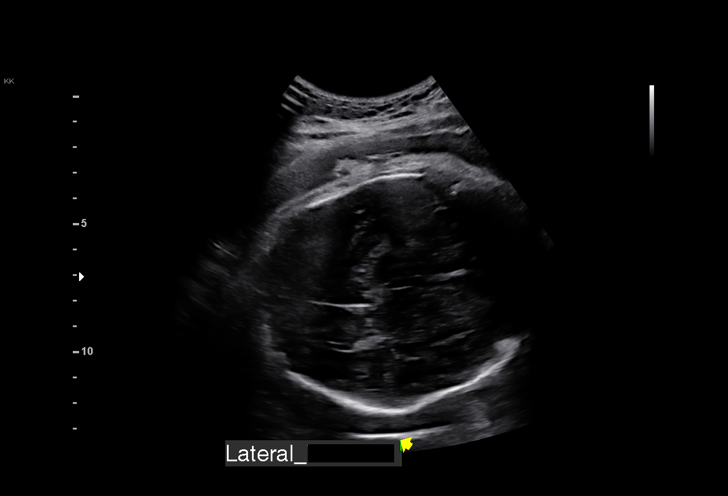
[im 50/50]
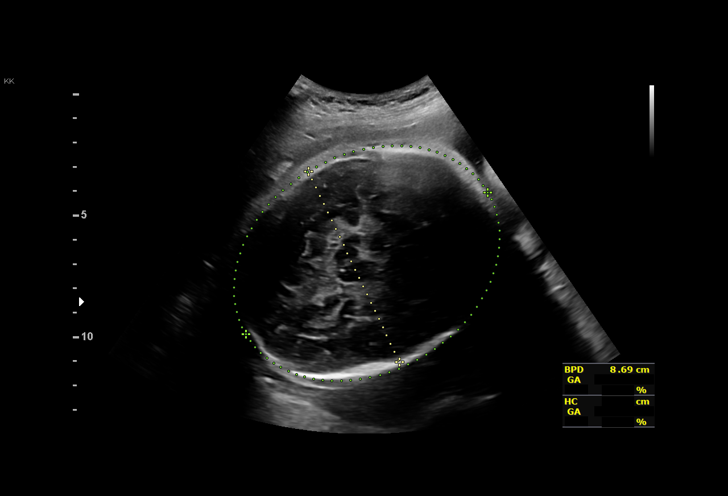

[14 of 28 positions shown; findings below may reference images not displayed]

----------------------------------------------------------------------

 ----------------------------------------------------------------------
Indications

  Cholestasis of pregnancy, third trimester      [id number]
  Late to prenatal care, third trimester (Low
  Risk NIPS)
  Placenta previa specified as without
  hemorrhage, third trimester (resolved)
  Asthma                                         KZZ.CZ [id number]
  Medical complication of pregnancy (Acute
  Respiratory Failure)
  Genetic carrier (Alpha thalassemis silent
  carrier)
  36 weeks gestation of pregnancy
 ----------------------------------------------------------------------
Vital Signs

                                                Height:        5'0"
Fetal Evaluation

 Num Of Fetuses:         1
 Fetal Heart Rate(bpm):  142
 Cardiac Activity:       Observed
 Presentation:           Cephalic
 Placenta:               Posterior
 P. Cord Insertion:      Previously Visualized

 Amniotic Fluid
 AFI FV:      Within normal limits
 AFI Sum(cm)     %Tile       Largest Pocket(cm)
 17.93           67

 RUQ(cm)       RLQ(cm)       LUQ(cm)        LLQ(cm)

Biophysical Evaluation

 Amniotic F.V:   Pocket => 2 cm             F. Tone:        Observed
 F. Movement:    Observed                   Score:          [DATE]
 F. Breathing:   Observed
Biometry

 BPD:      87.8  mm     G. Age:  35w 3d         34  %    CI:        73.11   %    70 - 86
                                                         FL/HC:      21.0   %    20.1 -
 HC:      326.4  mm     G. Age:  37w 0d         35  %    HC/AC:      0.93        0.93 -
 AC:      349.1  mm     G. Age:  38w 6d         98  %    FL/BPD:     78.2   %    71 - 87
 FL:       68.7  mm     G. Age:  35w 2d         19  %    FL/AC:      19.7   %    20 - 24

 Est. FW:    5082  gm      7 lb 1 oz     79  %
OB History

 Gravidity:    3         Term:   2        Prem:   0        SAB:   0
 TOP:          0       Ectopic:  0        Living: 2
Gestational Age

 LMP:           36w 3d        Date:  08/03/18                 EDD:   05/10/19
 U/S Today:     36w 5d                                        EDD:   05/08/19
 Best:          36w 3d     Det. By:  LMP  (08/03/18)          EDD:   05/10/19
Anatomy

 Cranium:               Appears normal         Aortic Arch:            Appears normal
 Cavum:                 Previously seen        Ductal Arch:            Previously seen
 Ventricles:            Appears normal         Diaphragm:              Previously seen
 Choroid Plexus:        Previously seen        Stomach:                Appears normal, left
                                                                       sided
 Cerebellum:            Previously seen        Abdomen:                Appears normal
 Posterior Fossa:       Previously seen        Abdominal Wall:         Previously seen
 Nuchal Fold:           Previously seen        Cord Vessels:           Previously seen
 Face:                  Orbits and profile     Kidneys:                Appear normal
                        previously seen
 Lips:                  Previously seen        Bladder:                Appears normal
 Thoracic:              Appears normal         Spine:                  Previously seen
 Heart:                 Previously seen        Upper Extremities:      Previously seen
 RVOT:                  Previously seen        Lower Extremities:      Previously seen
 LVOT:                  Previously seen

 Other:  Heels/feet and open hands/5th digits visualized. Nasal bone
         visualized.
Cervix Uterus Adnexa

 Cervix
 Not visualized (advanced GA >77wks)
Impression

 Intrahepatic cholestasis of pregnancy.

 Amniotic fluid is normal and good fetal activity is seen. Fetal
 growth is appropriate for gestational age. Antenatal testing is
 reassuring. BPP [DATE].
 Patient will be undergoing induction of labor on 04/20/19.
                 Arbi, Gawing

## 2020-03-31 ENCOUNTER — Telehealth: Payer: Self-pay | Admitting: Allergy & Immunology

## 2020-03-31 NOTE — Telephone Encounter (Signed)
Patient called to schedule an appointment in February. She wanted February 22nd. She has seen me in the past, although I think that she is a Dr. Delorse Lek patient in actuality. Regardless, I just added her at 10am on February 22nd with me.  Malachi Bonds, MD [facility]

## 2020-04-28 ENCOUNTER — Encounter: Payer: Self-pay | Admitting: Allergy & Immunology

## 2020-04-28 ENCOUNTER — Other Ambulatory Visit: Payer: Self-pay

## 2020-04-28 ENCOUNTER — Ambulatory Visit: Payer: Medicaid Other | Admitting: Allergy & Immunology

## 2020-04-28 VITALS — BP 92/68 | HR 88 | Temp 97.3°F | Resp 16

## 2020-04-28 DIAGNOSIS — T7800XA Anaphylactic reaction due to unspecified food, initial encounter: Secondary | ICD-10-CM

## 2020-04-28 DIAGNOSIS — J4550 Severe persistent asthma, uncomplicated: Secondary | ICD-10-CM | POA: Diagnosis not present

## 2020-04-28 DIAGNOSIS — L505 Cholinergic urticaria: Secondary | ICD-10-CM

## 2020-04-28 DIAGNOSIS — T7800XD Anaphylactic reaction due to unspecified food, subsequent encounter: Secondary | ICD-10-CM | POA: Diagnosis not present

## 2020-04-28 DIAGNOSIS — J3089 Other allergic rhinitis: Secondary | ICD-10-CM

## 2020-04-28 DIAGNOSIS — T50905D Adverse effect of unspecified drugs, medicaments and biological substances, subsequent encounter: Secondary | ICD-10-CM

## 2020-04-28 MED ORDER — BUDESONIDE-FORMOTEROL FUMARATE 160-4.5 MCG/ACT IN AERO
2.0000 | INHALATION_SPRAY | Freq: Two times a day (BID) | RESPIRATORY_TRACT | 5 refills | Status: DC
Start: 2020-04-28 — End: 2020-11-11

## 2020-04-28 MED ORDER — MONTELUKAST SODIUM 10 MG PO TABS
10.0000 mg | ORAL_TABLET | Freq: Every day | ORAL | 5 refills | Status: DC
Start: 2020-04-28 — End: 2020-12-09

## 2020-04-28 NOTE — Progress Notes (Signed)
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 Severe persistent asthma, uncomplicated  - Lung testing looked stable today. - I am so happy with how well you are doing.  - Daily controller medication(s): Singulair 10mg  daily and Symbicort 160/4.71mcg two puffs twice daily with spacer - Prior to physical activity: albuterol 2 puffs 10-15 minutes before physical activity. - Rescue medications: albuterol 4 puffs every 4-6 hours as needed - Asthma control goals:  * Full participation in all desired activities (may need albuterol before activity) * Albuterol use two time or less a week on average (not counting use with activity) * Cough interfering with sleep two time or less a month * Oral steroids no more than once a year  * No hospitalizations  2. Perennial allergic rhinitis (cockroach) - Continue with cetirizine 10mg  daily as needed. - Continue with Rhinocort one spray per nostril daily as needed.  - Continue with montelukast 10mg  daily.   3 Cholinergic urticaria - Continue with Xyzal 5mg  daily during warmer weather months  4. Food allergy - Continue avoidance of shellfish - Have access to self-injectable epinephrine Epipen 0.3mg  at all times - Follow emergency action plan in case of allergic reaction - Continue to eat fin fish like tuna and salmon.    5. Drug allergy - Continue with avoidance of Tylenol (acetominphen)   6. Return in about 6 months (around 10/26/2020).   Subjective:   Dawn Haney is a 35 y.o. female presenting today for follow up of  Chief Complaint  Patient presents with  . Asthma    Dawn Haney has a history of the  following: Patient Active Problem List   Diagnosis Date Noted  . Postpartum care following vaginal delivery 05/21/2019  . Nexplanon in place 05/21/2019  . Language barrier 02/11/2019  . Alpha thalassemia silent carrier 02/07/2019    History obtained from: chart review and patient.  Dawn Haney is a 35 y.o. female presenting for a follow up visit.  She was last seen in November 2021.  At that time, we deferred her lung testing.  We continued with Symbicort 160 mcg 2 puffs twice daily as well as albuterol as needed.  For her allergic rhinitis, we continue with cetirizine as well as Rhinocort and montelukast.  We also continue with Xyzal for her cholinergic urticaria.  We also recommended continued avoidance of shellfish and acetaminophen.  Since last visit,  Asthma/Respiratory Symptom History: She remains on the Symbicort two puffs twice daily with a spacer.  She is also using the Singulair. Chenille's asthma has been well controlled. She has not required rescue medication, experienced nocturnal awakenings due to lower respiratory symptoms, nor have activities of daily living been limited. She has required no Emergency Department or Urgent Care visits for her asthma. She has required zero courses of systemic steroids for asthma exacerbations since the last visit. ACT score today is 25, indicating excellent asthma symptom control. She has been very compliant with her medications and she feels that this is working well.   Allergic Rhinitis Symptom History: She has been using her cetirizine and montelukast. She is not using her Rhinocort on a daily basis. She has not needed antibiotics at all since the last visit.  Food Allergy Symptom History: She  denies any shellfish exposure. She has not needed to use her EpiPen at all. She does not miss it all that much. She does eat tuna without a problem. But the expense of it too much. This is not a a daily item that she eats.   Urticaria Symptom History: She  has had no problems with her urticaria at all with the Xyzal. She has not needed any epinephrine for throat swelling.   Her baby is now just over one year old. She did have a smash cake and enjoyed it immensely. Her husband is a Psychologist, occupational, but the jobs seems sporadic. There are three children at home (9yo, 8yo, and 1yo).   Otherwise, there have been no changes to her past medical history, surgical history, family history, or social history.    Review of Systems  Constitutional: Negative.  Negative for chills, fever, malaise/fatigue and weight loss.  HENT: Positive for congestion. Negative for ear discharge, ear pain and sinus pain.   Eyes: Negative for pain, discharge and redness.  Respiratory: Negative for cough, sputum production, shortness of breath and wheezing.   Cardiovascular: Negative.  Negative for chest pain and palpitations.  Gastrointestinal: Negative for abdominal pain, constipation, diarrhea, heartburn, nausea and vomiting.  Skin: Negative.  Negative for itching and rash.  Neurological: Negative for dizziness and headaches.  Endo/Heme/Allergies: Positive for environmental allergies. Does not bruise/bleed easily.       Objective:   Blood pressure 92/68, pulse 88, temperature (!) 97.3 F (36.3 C), temperature source Temporal, resp. rate 16, SpO2 96 %, not currently breastfeeding. There is no height or weight on file to calculate BMI.   Physical Exam:  Physical Exam Constitutional:      Appearance: She is well-developed.     Comments: Pleasant female.   HENT:     Head: Normocephalic and atraumatic.     Right Ear: Tympanic membrane, ear canal and external ear normal.     Left Ear: Tympanic membrane, ear canal and external ear normal.     Nose: Mucosal edema present. No nasal deformity, septal deviation, rhinorrhea or epistaxis.     Right Turbinates: Enlarged and swollen.     Left Turbinates: Enlarged and swollen.     Right Sinus: No maxillary sinus tenderness or  frontal sinus tenderness.     Left Sinus: No maxillary sinus tenderness or frontal sinus tenderness.     Mouth/Throat:     Mouth: Oropharynx is clear and moist. Mucous membranes are not pale and not dry.     Pharynx: Uvula midline.  Eyes:     General: Lids are normal. Allergic shiner present.        Right eye: No discharge.        Left eye: No discharge.     Extraocular Movements: EOM normal.     Conjunctiva/sclera: Conjunctivae normal.     Right eye: Right conjunctiva is not injected. No chemosis.    Left eye: Left conjunctiva is not injected. No chemosis.    Pupils: Pupils are equal, round, and reactive to light.  Cardiovascular:     Rate and Rhythm: Normal rate and regular rhythm.     Heart sounds: Normal heart sounds.  Pulmonary:     Effort: Pulmonary effort is normal. No tachypnea, accessory muscle usage or respiratory distress.     Breath sounds: Normal breath sounds. No wheezing, rhonchi or rales.     Comments: No crackles or wheezes noted.  Chest:     Chest wall: No tenderness.  Lymphadenopathy:     Cervical: No cervical adenopathy.  Skin:    Coloration: Skin is not pale.     Findings: No abrasion, erythema, petechiae or rash. Rash is not papular, urticarial or vesicular.  Neurological:     Mental Status: She is alert.  Psychiatric:        Mood and Affect: Mood and affect normal.        Behavior: Behavior is cooperative.      Diagnostic studies:    Spirometry: results normal (FEV1: 2.12/83%, FVC: 2.52/84%, FEV1/FVC: 84%).    Spirometry consistent with normal pattern.   Allergy Studies: none       Malachi Bonds, MD  [facility]

## 2020-04-28 NOTE — Patient Instructions (Addendum)
1. Severe persistent asthma, uncomplicated  - Lung testing looked stable today. - I am so happy with how well you are doing.  - Daily controller medication(s): Singulair 10mg  daily and Symbicort 160/4.64mcg two puffs twice daily with spacer - Prior to physical activity: albuterol 2 puffs 10-15 minutes before physical activity. - Rescue medications: albuterol 4 puffs every 4-6 hours as needed - Asthma control goals:  * Full participation in all desired activities (may need albuterol before activity) * Albuterol use two time or less a week on average (not counting use with activity) * Cough interfering with sleep two time or less a month * Oral steroids no more than once a year  * No hospitalizations  2. Perennial allergic rhinitis (cockroach) - Continue with cetirizine 10mg  daily as needed. - Continue with Rhinocort one spray per nostril daily as needed.  - Continue with montelukast 10mg  daily.   3 Cholinergic urticaria - Continue with Xyzal 5mg  daily during warmer weather months  4. Food allergy - Continue avoidance of shellfish - Have access to self-injectable epinephrine Epipen 0.3mg  at all times - Follow emergency action plan in case of allergic reaction - Continue to eat fin fish like tuna and salmon.    5. Drug allergy - Continue with avoidance of Tylenol (acetominphen)   6. Return in about 6 months (around 10/26/2020).    Please inform of any Emergency Department visits, hospitalizations, or changes in symptoms. Call before going to the ED for breathing or allergy symptoms since we might be able to fit you in for a sick visit. Feel free to contact anytime with any questions, problems, or concerns.  It was a pleasure to see you again today!  Websites that have reliable patient information: 1. American Academy of Asthma, Allergy, and Immunology: www.aaaai.org 2. Food Allergy Research and Education (FARE): foodallergy.org 3. Mothers of Asthmatics:  http://www.asthmacommunitynetwork.org 4. American College of Allergy, Asthma, and Immunology: www.acaai.org   COVID-19 Vaccine Information can be found at: 10/28/2020 For questions related to vaccine distribution or appointments, please email [email or web address] or call [phone].   We realize that you might be concerned about having an allergic reaction to the COVID19 vaccines. To help with that concern, WE ARE OFFERING THE COVID19 VACCINES IN OUR OFFICE! Ask the front desk for dates!     "Like" [place] on Facebook and Instagram for our latest updates!      A healthy democracy works best when [place] participate! Make sure you are registered to vote! If you have moved or changed any of your contact information, you will need to get this updated before voting!  In some cases, you MAY be able to register to vote online: [email or web address]

## 2020-10-27 ENCOUNTER — Ambulatory Visit: Payer: Medicaid Other | Admitting: Allergy & Immunology

## 2020-11-11 ENCOUNTER — Other Ambulatory Visit: Payer: Self-pay

## 2020-11-11 ENCOUNTER — Ambulatory Visit (INDEPENDENT_AMBULATORY_CARE_PROVIDER_SITE_OTHER): Payer: Medicaid Other | Admitting: Family Medicine

## 2020-11-11 ENCOUNTER — Encounter: Payer: Self-pay | Admitting: Family Medicine

## 2020-11-11 VITALS — BP 94/70 | HR 90 | Temp 98.8°F | Resp 16 | Ht 60.0 in | Wt 123.4 lb

## 2020-11-11 DIAGNOSIS — J4540 Moderate persistent asthma, uncomplicated: Secondary | ICD-10-CM | POA: Diagnosis not present

## 2020-11-11 DIAGNOSIS — H1013 Acute atopic conjunctivitis, bilateral: Secondary | ICD-10-CM

## 2020-11-11 DIAGNOSIS — L505 Cholinergic urticaria: Secondary | ICD-10-CM | POA: Diagnosis not present

## 2020-11-11 DIAGNOSIS — J3089 Other allergic rhinitis: Secondary | ICD-10-CM

## 2020-11-11 DIAGNOSIS — T7800XA Anaphylactic reaction due to unspecified food, initial encounter: Secondary | ICD-10-CM | POA: Insufficient documentation

## 2020-11-11 HISTORY — DX: Cholinergic urticaria: L50.5

## 2020-11-11 MED ORDER — BUDESONIDE-FORMOTEROL FUMARATE 160-4.5 MCG/ACT IN AERO: 2.0000 | INHALATION_SPRAY | Freq: Two times a day (BID) | RESPIRATORY_TRACT | 5 refills | Status: DC

## 2020-11-11 NOTE — Patient Instructions (Addendum)
Asthma Continue Symbicort 160-2 puffs twice a day to prevent cough or wheeze  Begin Spiriva 1.25 mcg - 2 puffs once a day to prevent cough or wheeze Continue montelukast 10 mg once a day to prevent cough or wheeze Continue albuterol 2 puffs every 4 hours as needed for cough or wheeze OR Instead use albuterol 0.083% solution via nebulizer one unit vial every 4 hours as needed for cough or wheeze You may use albuterol 2 puffs 5 to 15 minutes before activity to decrease cough or wheeze We have ordered 1 lab to help [place] manage your asthma symptoms.  We will call you when the result becomes available  Allergic rhinitis Continue allergen avoidance measures directed toward cockroach as listed below Continue cetirizine 10 mg once a day as needed for runny nose or itch Begin Flonase 2 sprays in each nostril once a day as needed for runny nose. In the right nostril, point the applicator out toward the right ear. In the left nostril, point the applicator out toward the left ear Consider saline nasal rinses as needed for nasal symptoms. Use this before any medicated nasal sprays for best result  Cholinergic urticaria Continue cetirizine as listed above  Food allergy Continue to avoid shellfish. In case of an allergic reaction, take Benadryl 50 mg every 4 hours, and if life-threatening symptoms occur, inject with EpiPen 0.3 mg. When your breathing is back to baseline, make an appointment for a food challenge to shellfish.  Drug allergy Continue to avoid acetaminophen (Tylenol) and products containing acetaminophen  Call the clinic if this treatment plan is not working well for you.  Follow up in 4 weeks or sooner if needed.  Control of Cockroach Allergen  Cockroach allergen has been identified as an important cause of acute attacks of asthma, especially in urban settings.  There are fifty-five species of cockroach that exist in the [place], however only three, the [place], [place]  species produce allergen that can affect patients with Asthma.  Allergens can be obtained from fecal particles, egg casings and secretions from cockroaches.    Remove food sources. Reduce access to water. Seal access and entry points. Spray runways with 0.5-1% Diazinon or Chlorpyrifos Blow boric acid power under stoves and refrigerator. Place bait stations (hydramethylnon) at feeding sites.

## 2020-11-11 NOTE — Progress Notes (Signed)
[address] [place] [address] Dept: [phone]  FOLLOW UP NOTE  Patient ID: Dawn Haney, female    DOB: Sep 28, 1985  Age: 35 y.o. MRN: [id number] Date of Office Visit: 11/11/2020  Assessment  Chief Complaint: Follow-up  HPI Dawn Haney is a 35 year old female who presents to the clinic for a follow up visit. She was last seen in the clinic on 01/07/2020 for evaluation of asthma, allergic rhinitis, cholinergic urticaria, food allergy to shellfish and drug allergy to acetaminophen.  Her history includes alpha thalassemia silent carrier.  At today's visit, she reports her asthma has been much more well controlled since beginning Symbicort 160.  However, she does report shortness of breath and wheeze with moderate activity.  She denies shortness of breath and wheeze at rest.  She denies cough with activity or rest.  She continues montelukast 10 mg once a day, Symbicort 160-2 puffs twice a day with a spacer, and albuterol 2 puffs before activity and albuterol for rescue about 3 to 4 days a week.  She reports that she has been out of Symbicort 160 for about 1 month.  Allergic rhinitis is reported as moderately well controlled with symptoms including nasal congestion and occasional postnasal drainage.  She continues cetirizine 10 mg once a day and Rhinocort as needed.  She is not currently using a nasal saline rinse.  She reports episodes of urticaria occurring when she is hot or when she is exposed to heat.  She denies concomitant gastrointestinal or cardiopulmonary symptoms with these hives.  She reports the hives resolve quickly once she cools off or if she takes a cool shower.  She continues to avoid shellfish with no accidental ingestion or EpiPen use since her last visit to this clinic.  Her lab test on 05/24/2019 indicated low levels of IgE to shellfish. She continues to avoid acetaminophen and can take aspirin and products containing aspirin if needed with no adverse  effects.  Her current medications are listed in the chart Of note, her last total IgE was 66 on 05/24/2019  Drug Allergies:  Allergies  Allergen Reactions   Shrimp [Shellfish Allergy] Itching   Tylenol [Acetaminophen] Rash    Physical Exam: BP 94/70   Pulse 90   Temp 98.8 F (37.1 C) (Temporal)   Resp 16   Ht 5' (1.524 m)   Wt 123 lb 6 oz (56 kg)   SpO2 98%   BMI 24.10 kg/m    Physical Exam Vitals reviewed.  Constitutional:      Appearance: Normal appearance.  HENT:     Head: Normocephalic and atraumatic.     Right Ear: Tympanic membrane normal.     Left Ear: Tympanic membrane normal.     Nose:     Comments: Bilateral nares slightly erythematous with clear nasal drainage noted.  Pharynx normal.  Ears normal.  Eyes normal.    Mouth/Throat:     Pharynx: Oropharynx is clear.  Eyes:     Conjunctiva/sclera: Conjunctivae normal.  Cardiovascular:     Rate and Rhythm: Normal rate and regular rhythm.     Heart sounds: Normal heart sounds. No murmur heard. Pulmonary:     Effort: Pulmonary effort is normal.     Breath sounds: Normal breath sounds.     Comments: Lungs clear to auscultation Musculoskeletal:        General: Normal range of motion.     Cervical back: Normal range of motion and neck supple.  Skin:  General: Skin is warm and dry.  Neurological:     Mental Status: She is alert and oriented to person, place, and time.  Psychiatric:        Mood and Affect: Mood normal.        Behavior: Behavior normal.        Thought Content: Thought content normal.        Judgment: Judgment normal.    Diagnostics: FVC 2.09, FEV1 1.90. Predicted FVC 2.79, predicted FEV1 2.42. Spirometry indicates mild restriction. Post bronchodilator therapy FVC 2.11, FEV1 1.98. Post bronchodilator spirometry indicates mild restriction with no significant improvement.  Assessment and Plan: 1. Moderate persistent asthma without complication   2. Perennial allergic rhinitis   3. Allergic  conjunctivitis of both eyes   4. Cholinergic urticaria   5. Anaphylaxis due to food     Meds ordered this encounter  Medications   budesonide-formoterol (SYMBICORT) 160-4.5 MCG/ACT inhaler    Sig: Inhale 2 puffs into the lungs 2 (two) times daily.    Dispense:  11 g    Refill:  5     Patient Instructions  Asthma Continue Symbicort 160-2 puffs twice a day to prevent cough or wheeze  Begin Spiriva 1.25 mcg - 2 puffs once a day to prevent cough or wheeze Continue montelukast 10 mg once a day to prevent cough or wheeze Continue albuterol 2 puffs every 4 hours as needed for cough or wheeze OR Instead use albuterol 0.083% solution via nebulizer one unit vial every 4 hours as needed for cough or wheeze You may use albuterol 2 puffs 5 to 15 minutes before activity to decrease cough or wheeze We have ordered 1 lab to help [place] manage your asthma symptoms.  We will call you when the result becomes available  Allergic rhinitis Continue allergen avoidance measures directed toward cockroach as listed below Continue cetirizine 10 mg once a day as needed for runny nose or itch Begin Flonase 2 sprays in each nostril once a day as needed for runny nose. In the right nostril, point the applicator out toward the right ear. In the left nostril, point the applicator out toward the left ear Consider saline nasal rinses as needed for nasal symptoms. Use this before any medicated nasal sprays for best result  Cholinergic urticaria Continue cetirizine as listed above  Food allergy Continue to avoid shellfish. In case of an allergic reaction, take Benadryl 50 mg every 4 hours, and if life-threatening symptoms occur, inject with EpiPen 0.3 mg. When your breathing is back to baseline, make an appointment for a food challenge to shellfish.  Drug allergy Continue to avoid acetaminophen (Tylenol) and products containing acetaminophen  Call the clinic if this treatment plan is not working well for  you.  Follow up in 4 weeks or sooner if needed.   Return in about 4 weeks (around 12/09/2020), or if symptoms worsen or fail to improve.    Thank you for the opportunity to care for this patient.  Please do not hesitate to contact me with questions.  Thermon Leyland, FNP [facility]

## 2020-11-17 ENCOUNTER — Telehealth: Payer: Self-pay

## 2020-11-17 NOTE — Telephone Encounter (Signed)
Pa submitted thru cover my meds for symbicort waiting on results from insurance

## 2020-12-08 NOTE — Progress Notes (Signed)
Follow Up Note  RE: Dawn Haney MRN: [id number] DOB: February 18, 1986 Date of Office Visit: 12/09/2020  Referring provider: No ref. provider found Primary care provider: Patient, No Pcp Per (Inactive)  Chief Complaint: Follow-up, Cough (Pt states she's had cough and sore throat past 3 days.), and Asthma (Pt states due to weather changes she have been using her inhaler frequently, she gets SOB that she can't catch a full breathe during cold weather.)  History of Present Illness: I had the pleasure of seeing Dawn Haney for a follow up visit at the [facility] on 12/09/2020. She is a 35 y.o. female, who is being followed for asthma, allergic rhinitis, cholinergic urticaria, food allergy and drug allergy. Her previous allergy office visit was on 11/11/2020 with Thermon Leyland, FNP. Today is a regular follow up visit. Declined language interpreter service.   Asthma Currently on Symbicort 2 puffs twice a day and Singulair 10mg  daily at night. She ran out of Spiriva and albuterol and needs a refill.  Noticed some coughing at night with the colder weather change.  Patient doesn't have solution for her nebulizer machine and would like a refill as well. Using Symbicort as a rescue lately.  Allergic rhinitis Ran out of nasal spray but the Flonase did help.  Not taking zyrtec as she doesn't have it.    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    Food allergy Avoiding shellfish with no reactions. Tolerates fish.    Assessment and Plan: Dawn Haney is a 35 y.o. female with: Moderate persistent asthma without complication Not well controlled but ran out of Spiriva and albuterol. Today' s spirometry showed some restriction. Get bloodwork to see if qualify for asthma biologics. Reviewed the use of each of her inhaler and stressed the difference between maintenance and rescue use.  Daily controller medication(s): continue Symbicort 20 2 puffs twice a  day with spacer and rinse mouth afterwards. Continue Spiriva 1.43mcg 2 puffs once a day. Continue Singulair (montelukast) 10mg  daily at night. May use albuterol rescue inhaler 2 puffs or nebulizer every 4 to 6 hours as needed for shortness of breath, chest tightness, coughing, and wheezing. May use albuterol rescue inhaler 2 puffs 5 to 15 minutes prior to strenuous physical activities. Monitor frequency of use.  Get spirometry at next visit.  Perennial allergic rhinitis Past history - 2021 bloodwork positive to cockroach only. Interim history - Flonase helped but ran out of it. Start Xyzal (levocetirizine) 5mg  daily at night. Use Flonase (fluticasone) nasal spray 1 spray per nostril twice a day as needed for nasal congestion.   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  Start Xyzal (levocetirizine) 5mg  daily at night.  Anaphylactic shock due to adverse food reaction Past history - 2021 bloodwork positive to shellfish. Continue to avoid shellfish. For mild symptoms you can take over the counter antihistamines such as Benadryl and monitor symptoms closely. If symptoms worsen or if you have severe symptoms including breathing issues, throat closure, significant swelling, whole body hives, severe diarrhea and vomiting, lightheadedness then inject epinephrine and seek immediate medical care afterwards.  Drug reaction Continue to avoid acetaminophen (Tylenol) and products containing acetaminophen  Return in about 2 months (around 02/08/2021).  Meds ordered this encounter  Medications   levocetirizine (XYZAL) 5 MG tablet    Sig: Take 1 tablet (5 mg total) by mouth every evening.    Dispense:  30 tablet    Refill:  5   Tiotropium Bromide  Monohydrate (SPIRIVA RESPIMAT) 1.25 MCG/ACT AERS    Sig: Inhale 2 puffs into the lungs daily.    Dispense:  4 g    Refill:  5   albuterol (PROAIR HFA) 108 (90 Base) MCG/ACT inhaler    Sig: Inhale 2 puffs into the lungs every 4 (four)  hours as needed for wheezing or shortness of breath (coughing fits).    Dispense:  18 g    Refill:  1   fluticasone (FLONASE) 50 MCG/ACT nasal spray    Sig: Place 1 spray into both nostrils 2 (two) times daily as needed (nasal congestion).    Dispense:  16 g    Refill:  5   montelukast (SINGULAIR) 10 MG tablet    Sig: Take 1 tablet (10 mg total) by mouth at bedtime.    Dispense:  30 tablet    Refill:  5   albuterol (PROVENTIL) (2.5 MG/3ML) 0.083% nebulizer solution    Sig: Take 3 mLs (2.5 mg total) by nebulization every 4 (four) hours as needed for wheezing or shortness of breath (coughing fits).    Dispense:  75 mL    Refill:  2    Lab Orders         CBC with Differential/Platelet         IgE      Diagnostics: Spirometry:  Tracings reviewed. Her effort: Good reproducible efforts. FVC: 2.23L FEV1: 1.97L, 77% predicted FEV1/FVC ratio: 88% Interpretation: Spirometry consistent with possible restrictive disease.  Please see scanned spirometry results for details.  Medication List:  Current Outpatient Medications  Medication Sig Dispense Refill   albuterol (PROAIR HFA) 108 (90 Base) MCG/ACT inhaler Inhale 2 puffs into the lungs every 4 (four) hours as needed for wheezing or shortness of breath (coughing fits). 18 g 1   albuterol (PROVENTIL) (2.5 MG/3ML) 0.083% nebulizer solution Take 3 mLs (2.5 mg total) by nebulization every 4 (four) hours as needed for wheezing or shortness of breath (coughing fits). 75 mL 2   budesonide-formoterol (SYMBICORT) 160-4.5 MCG/ACT inhaler Inhale 2 puffs into the lungs 2 (two) times daily. 11 g 5   EPINEPHrine 0.3 mg/0.3 mL IJ SOAJ injection Inject into the muscle.     fluticasone (FLONASE) 50 MCG/ACT nasal spray Place 1 spray into both nostrils 2 (two) times daily as needed (nasal congestion). 16 g 5   levocetirizine (XYZAL) 5 MG tablet Take 1 tablet (5 mg total) by mouth every evening. 30 tablet 5   montelukast (SINGULAIR) 10 MG tablet Take 1 tablet  (10 mg total) by mouth at bedtime. 30 tablet 5   Tiotropium Bromide Monohydrate (SPIRIVA RESPIMAT) 1.25 MCG/ACT AERS Inhale 2 puffs into the lungs daily. 4 g 5   No current facility-administered medications for this visit.   Allergies: Allergies  Allergen Reactions   Shrimp [Shellfish Allergy] Itching   Tylenol [Acetaminophen] Rash   I reviewed her past medical history, social history, family history, and environmental history and no significant changes have been reported from her previous visit.  Review of Systems  Constitutional:  Negative for appetite change, chills, fever and unexpected weight change.  HENT:  Negative for congestion and rhinorrhea.   Eyes:  Negative for itching.  Respiratory:  Positive for cough and shortness of breath. Negative for chest tightness and wheezing.   Gastrointestinal:  Negative for abdominal pain.  Skin:  Negative for rash.  Allergic/Immunologic: Positive for environmental allergies and food allergies.  Neurological:  Negative for headaches.   Objective: BP 108/64  Pulse 96   Temp 98.6 F (37 C) (Temporal)   Resp 17   Ht 5' (1.524 m)   Wt 121 lb 6.4 oz (55.1 kg)   SpO2 99%   BMI 23.71 kg/m  Body mass index is 23.71 kg/m. Physical Exam Vitals and nursing note reviewed.  Constitutional:      Appearance: Normal appearance. She is well-developed.  HENT:     Head: Normocephalic and atraumatic.     Right Ear: Tympanic membrane and external ear normal.     Left Ear: Tympanic membrane and external ear normal.     Nose: Nose normal.     Mouth/Throat:     Mouth: Mucous membranes are moist.     Pharynx: Oropharynx is clear.  Eyes:     Conjunctiva/sclera: Conjunctivae normal.  Cardiovascular:     Rate and Rhythm: Normal rate and regular rhythm.     Heart sounds: Normal heart sounds. No murmur heard. Pulmonary:     Effort: Pulmonary effort is normal.     Breath sounds: Normal breath sounds. No wheezing, rhonchi or rales.  Musculoskeletal:      Cervical back: Neck supple.  Skin:    General: Skin is warm.     Findings: No rash.  Neurological:     Mental Status: She is alert and oriented to person, place, and time.  Psychiatric:        Behavior: Behavior normal.   Previous notes and tests were reviewed. The plan was reviewed with the patient/family, and all questions/concerned were addressed.  It was my pleasure to see Dawn Haney today and participate in her care. Please feel free to contact me with any questions or concerns.  Sincerely,  Wyline Mood, DO Allergy & Immunology  [facility] office: [phone] [facility] office: [phone]

## 2020-12-09 ENCOUNTER — Other Ambulatory Visit: Payer: Self-pay

## 2020-12-09 ENCOUNTER — Ambulatory Visit (INDEPENDENT_AMBULATORY_CARE_PROVIDER_SITE_OTHER): Payer: Medicaid Other | Admitting: Allergy

## 2020-12-09 ENCOUNTER — Encounter: Payer: Self-pay | Admitting: Allergy

## 2020-12-09 VITALS — BP 108/64 | HR 96 | Temp 98.6°F | Resp 17 | Ht 60.0 in | Wt 121.4 lb

## 2020-12-09 DIAGNOSIS — T50905A Adverse effect of unspecified drugs, medicaments and biological substances, initial encounter: Secondary | ICD-10-CM | POA: Insufficient documentation

## 2020-12-09 DIAGNOSIS — J3089 Other allergic rhinitis: Secondary | ICD-10-CM

## 2020-12-09 DIAGNOSIS — J4540 Moderate persistent asthma, uncomplicated: Secondary | ICD-10-CM | POA: Diagnosis not present

## 2020-12-09 DIAGNOSIS — T7800XD Anaphylactic reaction due to unspecified food, subsequent encounter: Secondary | ICD-10-CM

## 2020-12-09 DIAGNOSIS — L505 Cholinergic urticaria: Secondary | ICD-10-CM | POA: Diagnosis not present

## 2020-12-09 DIAGNOSIS — T50905D Adverse effect of unspecified drugs, medicaments and biological substances, subsequent encounter: Secondary | ICD-10-CM

## 2020-12-09 DIAGNOSIS — H1013 Acute atopic conjunctivitis, bilateral: Secondary | ICD-10-CM | POA: Diagnosis not present

## 2020-12-09 MED ORDER — MONTELUKAST SODIUM 10 MG PO TABS: 10.0000 mg | ORAL_TABLET | Freq: Every day | ORAL | 5 refills | Status: DC

## 2020-12-09 MED ORDER — FLUTICASONE PROPIONATE 50 MCG/ACT NA SUSP: 1.0000 | Freq: Two times a day (BID) | NASAL | 5 refills | Status: DC | PRN

## 2020-12-09 MED ORDER — ALBUTEROL SULFATE HFA 108 (90 BASE) MCG/ACT IN AERS: 2.0000 | INHALATION_SPRAY | RESPIRATORY_TRACT | 1 refills | Status: DC | PRN

## 2020-12-09 MED ORDER — SPIRIVA RESPIMAT 1.25 MCG/ACT IN AERS: 2.0000 | INHALATION_SPRAY | Freq: Every day | RESPIRATORY_TRACT | 5 refills | Status: DC

## 2020-12-09 MED ORDER — ALBUTEROL SULFATE (2.5 MG/3ML) 0.083% IN NEBU
2.5000 mg | INHALATION_SOLUTION | RESPIRATORY_TRACT | 2 refills | Status: DC | PRN
Start: 2020-12-09 — End: 2022-03-31

## 2020-12-09 MED ORDER — LEVOCETIRIZINE DIHYDROCHLORIDE 5 MG PO TABS: 5.0000 mg | ORAL_TABLET | Freq: Every evening | ORAL | 5 refills | Status: DC

## 2020-12-09 NOTE — Assessment & Plan Note (Signed)
Not well controlled but ran out of Spiriva and albuterol.  Today' s spirometry showed some restriction. . Get bloodwork to see if qualify for asthma biologics. . Reviewed the use of each of her inhaler and stressed the difference between maintenance and rescue use.  . Daily controller medication(s): continue Symbicort 2 puffs twice a day with spacer and rinse mouth afterwards. o Continue Spiriva 1.23mcg 2 puffs once a day. o Continue Singulair (montelukast) 10mg  daily at night. . May use albuterol rescue inhaler 2 puffs or nebulizer every 4 to 6 hours as needed for shortness of breath, chest tightness, coughing, and wheezing. May use albuterol rescue inhaler 2 puffs 5 to 15 minutes prior to strenuous physical activities. Monitor frequency of use.  . Get spirometry at next visit.

## 2020-12-09 NOTE — Assessment & Plan Note (Signed)
Past history - 2021 bloodwork positive to cockroach only. Interim history - Flonase helped but ran out of it. . Start Xyzal (levocetirizine) 5mg  daily at night. . Use Flonase (fluticasone) nasal spray 1 spray per nostril twice a day as needed for nasal congestion.

## 2020-12-09 NOTE — Assessment & Plan Note (Signed)
.   Continue to avoid acetaminophen (Tylenol) and products containing acetaminophen

## 2020-12-09 NOTE — Assessment & Plan Note (Signed)
Past history - 2021 bloodwork positive to shellfish. . Continue to avoid shellfish. . For mild symptoms you can take over the counter antihistamines such as Benadryl and monitor symptoms closely. If symptoms worsen or if you have severe symptoms including breathing issues, throat closure, significant swelling, whole body hives, severe diarrhea and vomiting, lightheadedness then inject epinephrine and seek immediate medical care afterwards.

## 2020-12-09 NOTE — Assessment & Plan Note (Signed)
Usually flares in the warmer weather.  . Start Xyzal (levocetirizine) 5mg  daily at night.

## 2020-12-09 NOTE — Patient Instructions (Addendum)
Asthma Get bloodwork to see if you qualify for injection for asthma.  We are ordering labs, so please allow 1-2 weeks for the results to come back. With the newly implemented Cures Act, the labs might be visible to you at the same time that they become visible to me. However, I will not address the results until all of the results are back, so please be patient.  In the meantime, continue recommendations in your patient instructions, including avoidance measures (if applicable), until you hear from me.  Daily controller medication(s): continue Symbicort 2 puffs twice a day with spacer and rinse mouth afterwards. Continue Spiriva 1.33mcg 2 puffs once a day.. Continue Singulair (montelukast) 10mg  daily at night. May use albuterol rescue inhaler 2 puffs or nebulizer every 4 to 6 hours as needed for shortness of breath, chest tightness, coughing, and wheezing. May use albuterol rescue inhaler 2 puffs 5 to 15 minutes prior to strenuous physical activities. Monitor frequency of use.  Asthma control goals:  Full participation in all desired activities (may need albuterol before activity) Albuterol use two times or less a week on average (not counting use with activity) Cough interfering with sleep two times or less a month Oral steroids no more than once a year No hospitalizations   Allergic rhinitis Start Xyzal (levocetirizine) 5mg  daily at night. Use Flonase (fluticasone) nasal spray 1 spray per nostril twice a day as needed for nasal congestion.   Cholinergic urticaria Start Xyzal (levocetirizine) 5mg  daily at night.  Food allergy Continue to avoid shellfish. For mild symptoms you can take over the counter antihistamines such as Benadryl and monitor symptoms closely. If symptoms worsen or if you have severe symptoms including breathing issues, throat closure, significant swelling, whole body hives, severe diarrhea and vomiting, lightheadedness then inject epinephrine and seek immediate  medical care afterwards.  Drug allergy Continue to avoid acetaminophen (Tylenol) and products containing acetaminophen  Follow up in 8 weeks or sooner if needed.

## 2020-12-11 ENCOUNTER — Other Ambulatory Visit: Payer: Self-pay | Admitting: *Deleted

## 2020-12-11 MED ORDER — PROAIR HFA 108 (90 BASE) MCG/ACT IN AERS: 2.0000 | INHALATION_SPRAY | Freq: Four times a day (QID) | RESPIRATORY_TRACT | 1 refills | Status: AC | PRN

## 2020-12-26 LAB — CBC WITH DIFFERENTIAL/PLATELET
Basophils Absolute: 0.1 10*3/uL (ref 0.0–0.2)
Basos: 1 %
EOS (ABSOLUTE): 0.4 10*3/uL (ref 0.0–0.4)
Eos: 4 %
Hematocrit: 39.9 % (ref 34.0–46.6)
Hemoglobin: 12.7 g/dL (ref 11.1–15.9)
Immature Grans (Abs): 0 10*3/uL (ref 0.0–0.1)
Immature Granulocytes: 0 %
Lymphocytes Absolute: 2.7 10*3/uL (ref 0.7–3.1)
Lymphs: 29 %
MCH: 26 pg — ABNORMAL LOW (ref 26.6–33.0)
MCHC: 31.8 g/dL (ref 31.5–35.7)
MCV: 82 fL (ref 79–97)
Monocytes Absolute: 0.4 10*3/uL (ref 0.1–0.9)
Monocytes: 5 %
Neutrophils Absolute: 5.7 10*3/uL (ref 1.4–7.0)
Neutrophils: 61 %
Platelets: 428 10*3/uL (ref 150–450)
RBC: 4.88 x10E6/uL (ref 3.77–5.28)
RDW: 13.2 % (ref 11.7–15.4)
WBC: 9.2 10*3/uL (ref 3.4–10.8)

## 2020-12-26 LAB — IGE: IgE (Immunoglobulin E), Serum: 114 IU/mL (ref 6–495)

## 2020-12-30 ENCOUNTER — Telehealth: Payer: Self-pay

## 2020-12-30 NOTE — Telephone Encounter (Signed)
Patient called stating someone called but didn't leave a message

## 2021-01-13 ENCOUNTER — Other Ambulatory Visit: Payer: Self-pay | Admitting: *Deleted

## 2021-01-13 ENCOUNTER — Telehealth: Payer: Self-pay | Admitting: Family Medicine

## 2021-01-13 MED ORDER — PROAIR DIGIHALER 108 (90 BASE) MCG/ACT IN AEPB: 2.0000 | INHALATION_SPRAY | RESPIRATORY_TRACT | 1 refills | Status: DC | PRN

## 2021-01-13 NOTE — Telephone Encounter (Signed)
Tried calling pt phone just kept ringing.

## 2021-01-13 NOTE — Telephone Encounter (Signed)
Patient is calling in reference to labs results please advise

## 2021-02-01 ENCOUNTER — Encounter: Payer: Self-pay | Admitting: *Deleted

## 2021-02-03 NOTE — Telephone Encounter (Signed)
L/m for patient advising approval and submit if she wants to start Xolair

## 2021-02-03 NOTE — Telephone Encounter (Signed)
Pt returning our call- I went over lab results with her and she is interested in Xolair injections. Told her I would send message to [facility] and she would reach out to her regarding coverage. Mailed Xolair information.

## 2021-02-10 NOTE — Progress Notes (Signed)
FOLLOW UP Date of Service/Encounter:  02/12/21   Subjective:  Dawn Haney (DOB: 05-Feb-1986) is a 35 y.o. female who [facility] in re-evaluation of the following: Asthma, allergic rhinitis, shellfish allergy, cholinergic urticaria History obtained from: chart review and patient.  For Review, LV was on 12/09/2020 with Thermon Leyland, FNP seen for moderate persistent asthma: Not well controlled but ran out of Spiriva and albuterol, continue on Symbicort 160, 2 puffs twice a day and Singulair).  Cholinergic urticaria on Xyzal nightly.   History of food allergy to shellfish with 2021 positive blood work.   History of drug reaction to Tylenol.   FEV1 at last visit 77% predicted. Blood test on 12/18/2020 with total IgE 114 and 400 absolute eosinophil count. Paperwork was submitted for Xolair and patient approved.  However, these have not been started.  Today she presents for follow-up. She is using Symbicot and spiriva every day as instructed.  She also takes singulair at night.   She has shortness of breath only with intense exercise. She has used her albuterol about 2-3 times per week for cough/sob-but mostly this is in the setting of exercise.  No ED, UC visits for her asthma. She is not using her albuterol prior to exercise.  She did not realize this was a treatment option She does occassionally get cholinergic urticaria.  She will take Xyzal if she is really itchy.  Otherwise, this does not bother her She also describes a sensation of her ears feeling full.  She is not using her Flonase as prescribed at last visit. She is not interested in starting an injectable asthma medication as she feels that her symptoms are very controlled. She continues to avoid shellfish without any accidental exposures since her last visit.  Allergies as of 02/12/2021       Reactions   Shrimp [shellfish Allergy] Itching   Tylenol [acetaminophen] Rash         Medication List        Accurate as of February 12, 2021  1:30 PM. If you have any questions, ask your nurse or doctor.          albuterol (2.5 MG/3ML) 0.083% nebulizer solution Commonly known as: PROVENTIL Take 3 mLs (2.5 mg total) by nebulization every 4 (four) hours as needed for wheezing or shortness of breath (coughing fits).   albuterol 108 (90 Base) MCG/ACT inhaler Commonly known as: ProAir HFA Inhale 2 puffs into the lungs every 4 (four) hours as needed for wheezing or shortness of breath (coughing fits).   ProAir HFA 108 (90 Base) MCG/ACT inhaler Generic drug: albuterol Inhale 2 puffs into the lungs every 6 (six) hours as needed for wheezing or shortness of breath.   budesonide-formoterol 160-4.5 MCG/ACT inhaler Commonly known as: Symbicort Inhale 2 puffs into the lungs 2 (two) times daily.   EPINEPHrine 0.3 mg/0.3 mL Soaj injection Commonly known as: EPI-PEN Inject into the muscle.   fluticasone 50 MCG/ACT nasal spray Commonly known as: Flonase Place 1 spray into both nostrils 2 (two) times daily as needed (nasal congestion).   levocetirizine 5 MG tablet Commonly known as: XYZAL Take 1 tablet (5 mg total) by mouth every evening.   montelukast 10 MG tablet Commonly known as: Singulair Take 1 tablet (10 mg total) by mouth at bedtime.   ProAir Digihaler 108 (90 Base) MCG/ACT Aepb Generic drug: Albuterol Sulfate (sensor) Inhale 2 puffs into the lungs every 4 (four) hours as needed.  Spiriva Respimat 1.25 MCG/ACT Aers Generic drug: Tiotropium Bromide Monohydrate Inhale 2 puffs into the lungs daily.       Past Medical History:  Diagnosis Date   Acute respiratory failure with hypoxia (HCC) 03/07/2019   Asthma    Asthma affecting pregnancy in third trimester 03/07/2019   Bronchitis with asthma, acute 03/08/2019   Bronchitis with asthma, acute 03/08/2019   Cholestasis during pregnancy    Cholestasis of pregnancy 04/20/2019   Cholestasis of pregnancy,  antepartum 01/17/2019   Cholestasis - O26.619, K83.1 Q 4 wks 28 37 or as per MFM    Cholinergic urticaria 11/11/2020   Placenta previa 03/07/2019   02/21/19: posterior previa 03/04/19: resolved   Previous preterm delivery, antepartum 01/14/2019   SVD at 31 weeks in 2012   Supervision of high risk pregnancy, antepartum 01/08/2019    Nursing Staff Provider Office Location  [facility] Dating   LMP Language    Karen/English  Anatomy US   Flu Vaccine   Given 04/11/19 Genetic Screen  NIPS: low risks   AFP:   First Screen:  Quad:   TDaP vaccine   Declined Hgb A1C or  GTT Early  Third trimester: normal 2hr GTT  Rhogam     LAB RESULTS  Feeding Plan  undecided Blood Type B/Positive/-- (11/09 1005)  Contraception  Nexplanon Antibody Linna Caprice   Past Surgical History:  Procedure Laterality Date   NO PAST SURGERIES     Otherwise, there have been no changes to her past medical history, surgical history, family history, or social history.  ROS: All others negative except as noted per HPI.   Objective:  BP 98/60   Pulse (!) 107   Temp 97.9 F (36.6 C) (Temporal)   Resp 17   Wt 121 lb (54.9 kg)   SpO2 98%   BMI 23.63 kg/m  Body mass index is 23.63 kg/m. Physical Exam: General Appearance:  Alert, cooperative, no distress, appears stated age  Head:  Normocephalic, without obvious abnormality, atraumatic  Eyes:  Conjunctiva clear, EOM's intact  Nose: Nares normal, bilateral hypertrophic turbinates   Throat: Lips, tongue normal; teeth and gums normal, normal posterior oropharynx  Ears Canals normal bilaterally, tympanic membranes normal bilaterally  Neck: Supple, symmetrical  Lungs:   Clear to auscultation bilaterally, respirations unlabored, no coughing  Heart:  Regular rate and rhythm, no murmur, appears well perfused  Extremities: No edema  Skin: Skin color, texture, turgor normal, no rashes or lesions on visualized portions of skin  Neurologic: No gross deficits  Spirometry:  Tracings reviewed. Her  effort: Good reproducible efforts. FVC: 2.18L FEV1: 1.99L, 78% predicted FEV1/FVC ratio: 104% Interpretation: Spirometry consistent with possible restrictive disease.  Please see scanned spirometry results for details.  Assessment/Plan   Patient Instructions  Severe persistent asthma-controlled Daily controller medication(s): continue Symbicort 1 puff twice a day with spacer and rinse mouth afterwards. Continue Spiriva 1.59mcg 2 puffs once a day. Continue Singulair (montelukast) 10mg  daily at night. If you get sick, increase to Symbicort 160 mcg 2 puffs three times a day May use albuterol rescue inhaler 2 puffs or nebulizer every 4 to 6 hours as needed for shortness of breath, chest tightness, coughing, and wheezing. May use albuterol rescue inhaler 2 puffs 5 to 15 minutes prior to strenuous physical activities. Monitor frequency of use.  Asthma control goals:  Full participation in all desired activities (may need albuterol before activity) Albuterol use two times or less a week on average (not counting use with activity) Cough  interfering with sleep two times or less a month Oral steroids no more than once a year No hospitalizations  If you are not controlled, please let us know as you have been approved for Xolair.  An injectable asthma medication.  Allergic rhinitis with eustachian tube dysfunction Continue Xyzal (levocetirizine) 5mg  daily at night as needed.  Use Flonase (fluticasone) nasal spray 2 sprays per nostril daily as needed for nasal congestion. This will help with ears  Cholinergic urticaria-stable Continue Xyzal (levocetirizine) 5mg  daily at night as needed  Food allergy-stable Continue to avoid shellfish. For mild symptoms you can take over the counter antihistamines such as Benadryl and monitor symptoms closely. If symptoms worsen or if you have severe symptoms including breathing issues, throat closure, significant swelling, whole body hives, severe diarrhea  and vomiting, lightheadedness then inject epinephrine and seek immediate medical care afterwards.  Drug allergy Continue to avoid acetaminophen (Tylenol) and products containing acetaminophen  Follow up in 4 months, or sooner if needed.   , MD  [facility]

## 2021-02-10 NOTE — Telephone Encounter (Signed)
L/M for patient to contact me 

## 2021-02-12 ENCOUNTER — Ambulatory Visit (INDEPENDENT_AMBULATORY_CARE_PROVIDER_SITE_OTHER): Payer: Medicaid Other | Admitting: Internal Medicine

## 2021-02-12 ENCOUNTER — Other Ambulatory Visit: Payer: Self-pay

## 2021-02-12 ENCOUNTER — Encounter: Payer: Self-pay | Admitting: Internal Medicine

## 2021-02-12 VITALS — BP 98/60 | HR 107 | Temp 97.9°F | Resp 17 | Wt 121.0 lb

## 2021-02-12 DIAGNOSIS — L505 Cholinergic urticaria: Secondary | ICD-10-CM

## 2021-02-12 DIAGNOSIS — J4540 Moderate persistent asthma, uncomplicated: Secondary | ICD-10-CM

## 2021-02-12 DIAGNOSIS — J3089 Other allergic rhinitis: Secondary | ICD-10-CM

## 2021-02-12 DIAGNOSIS — H1013 Acute atopic conjunctivitis, bilateral: Secondary | ICD-10-CM

## 2021-02-12 DIAGNOSIS — T7800XD Anaphylactic reaction due to unspecified food, subsequent encounter: Secondary | ICD-10-CM

## 2021-02-12 MED ORDER — SPIRIVA RESPIMAT 1.25 MCG/ACT IN AERS: 2.0000 | INHALATION_SPRAY | Freq: Every day | RESPIRATORY_TRACT | 5 refills | Status: DC

## 2021-02-12 MED ORDER — FLUTICASONE PROPIONATE 50 MCG/ACT NA SUSP: 1.0000 | Freq: Two times a day (BID) | NASAL | 5 refills | Status: DC | PRN

## 2021-02-12 MED ORDER — BUDESONIDE-FORMOTEROL FUMARATE 160-4.5 MCG/ACT IN AERO: 2.0000 | INHALATION_SPRAY | Freq: Two times a day (BID) | RESPIRATORY_TRACT | 5 refills | Status: DC

## 2021-02-12 MED ORDER — LEVOCETIRIZINE DIHYDROCHLORIDE 5 MG PO TABS: 5.0000 mg | ORAL_TABLET | Freq: Every evening | ORAL | 5 refills | Status: DC

## 2021-02-12 MED ORDER — MONTELUKAST SODIUM 10 MG PO TABS: 10.0000 mg | ORAL_TABLET | Freq: Every day | ORAL | 5 refills | Status: DC

## 2021-02-12 NOTE — Patient Instructions (Addendum)
Asthma Daily controller medication(s): continue Symbicort 1 puff twice a day with spacer and rinse mouth afterwards. Continue Spiriva 1.52mcg 2 puffs once a day.. Continue Singulair (montelukast) 10mg  daily at night. If you get sick, increase to Symbicort 160 mcg 2 puffs three times a day May use albuterol rescue inhaler 2 puffs or nebulizer every 4 to 6 hours as needed for shortness of breath, chest tightness, coughing, and wheezing. May use albuterol rescue inhaler 2 puffs 5 to 15 minutes prior to strenuous physical activities. Monitor frequency of use.  Asthma control goals:  Full participation in all desired activities (may need albuterol before activity) Albuterol use two times or less a week on average (not counting use with activity) Cough interfering with sleep two times or less a month Oral steroids no more than once a year No hospitalizations  If you are not controlled, please let June know as you have been approved for Xolair.  An injectable asthma medication.  Allergic rhinitis Continue Xyzal (levocetirizine) 5mg  daily at night as needed.  Use Flonase (fluticasone) nasal spray 2 sprays per nostril daily as needed for nasal congestion. This will help with ears  Cholinergic urticaria Continue Xyzal (levocetirizine) 5mg  daily at night as needed  Food allergy Continue to avoid shellfish. For mild symptoms you can take over the counter antihistamines such as Benadryl and monitor symptoms closely. If symptoms worsen or if you have severe symptoms including breathing issues, throat closure, significant swelling, whole body hives, severe diarrhea and vomiting, lightheadedness then inject epinephrine and seek immediate medical care afterwards.  Drug allergy Continue to avoid acetaminophen (Tylenol) and products containing acetaminophen  Follow up in 4 months, or sooner if needed.

## 2021-02-16 NOTE — Telephone Encounter (Signed)
Patient never returned calls and unable to send Mychart message due to no registration with same. If patient returns or calls clinic she can reach out to me to discuss starting therapy

## 2021-02-16 NOTE — Telephone Encounter (Signed)
Noted  

## 2021-03-03 ENCOUNTER — Encounter: Payer: Self-pay | Admitting: Family Medicine

## 2021-03-03 ENCOUNTER — Other Ambulatory Visit: Payer: Self-pay

## 2021-03-03 ENCOUNTER — Ambulatory Visit (INDEPENDENT_AMBULATORY_CARE_PROVIDER_SITE_OTHER): Payer: Medicaid Other | Admitting: Family Medicine

## 2021-03-03 VITALS — HR 85 | Resp 18

## 2021-03-03 DIAGNOSIS — J4521 Mild intermittent asthma with (acute) exacerbation: Secondary | ICD-10-CM

## 2021-03-03 DIAGNOSIS — J0100 Acute maxillary sinusitis, unspecified: Secondary | ICD-10-CM | POA: Insufficient documentation

## 2021-03-03 DIAGNOSIS — L505 Cholinergic urticaria: Secondary | ICD-10-CM | POA: Diagnosis not present

## 2021-03-03 DIAGNOSIS — Z9189 Other specified personal risk factors, not elsewhere classified: Secondary | ICD-10-CM | POA: Insufficient documentation

## 2021-03-03 DIAGNOSIS — T50905D Adverse effect of unspecified drugs, medicaments and biological substances, subsequent encounter: Secondary | ICD-10-CM

## 2021-03-03 DIAGNOSIS — H1013 Acute atopic conjunctivitis, bilateral: Secondary | ICD-10-CM

## 2021-03-03 DIAGNOSIS — T7800XD Anaphylactic reaction due to unspecified food, subsequent encounter: Secondary | ICD-10-CM

## 2021-03-03 MED ORDER — AMOXICILLIN-POT CLAVULANATE 875-125 MG PO TABS: 1.0000 | ORAL_TABLET | Freq: Two times a day (BID) | ORAL | 0 refills | Status: DC

## 2021-03-03 NOTE — Progress Notes (Signed)
[address] [place] [address] Dept: [phone]  FOLLOW UP NOTE  Patient ID: Dawn Haney, female    DOB: [birth year]  Age: 35 y.o. MRN: [id number] Date of Office Visit: 03/03/2021  Assessment  Chief Complaint: Ear Fullness  HPI Dawn Haney is a 35 year old female who presents to the clinic for follow-up visit.  She was last seen in this clinic on 02/12/2021 by Dr. 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 At today's visit, she reports her asthma has been poorly controlled for the last week with symptoms worsening over the last 2 to 3 days.  She reports shortness of breath with activity and rest, wheeze occurring in the daytime and nighttime, and dry cough occurring in the daytime and nighttime.  She continues Symbicort 160-2 puffs twice a day with a spacer, Spiriva 1.25 mcg 2 puffs once a day, montelukast 10 mg once a day, and albuterol 3-4 times a day with moderate relief of symptoms over the last several days.  She does qualify for a biologic therapy to help control her asthma, however, she is not interested in pursuing this at this time.  Allergic rhinitis is reported as poorly controlled with symptoms including thick yellow rhinorrhea, nasal congestion, occasional sneezing, headache, and pain below both eyes.  She denies disturbance of her taste and smell.  She denies fever, sweats, and chills.  She reports her children are currently sick and one of her children has a fever at this time.  She reports ear fullness in both of her ears with no pain.  She continues Flonase daily, Xyzal occasionally, and is not currently using saline nasal rinses.  She reports poor application technique with Flonase.  Her last environmental allergy testing was on 05/24/2019 via blood work and was positive to cockroach. Urticaria is reported as well controlled with no breakouts since her  last visit to this clinic.  She continues to avoid shellfish with no accidental ingestion or EpiPen use.  She continues to avoid Tylenol and uses aspirin for pain relief as needed.  Her current medications are listed in the chart.     Drug Allergies:  Allergies  Allergen Reactions   Shrimp [Shellfish Allergy] Itching   Tylenol [Acetaminophen] Rash    Physical Exam: Pulse 85    Resp 18    SpO2 98%    Physical Exam Vitals reviewed.  Constitutional:      Appearance: Normal appearance.  HENT:     Head: Normocephalic and atraumatic.     Right Ear: Tympanic membrane normal.     Left Ear: Tympanic membrane normal.     Nose:     Comments: Left nostril slightly erythematous with clear nasal drainage.  Right nostril with gross turbinate erythema and edema.  Pharynx is slightly erythematous with no exudate.  Ears normal.  Eyes normal.    Mouth/Throat:     Pharynx: Oropharynx is clear.  Eyes:     Conjunctiva/sclera: Conjunctivae normal.  Cardiovascular:     Rate and Rhythm: Normal rate and regular rhythm.     Heart sounds: Normal heart sounds. No murmur heard. Pulmonary:     Effort: Pulmonary effort is normal.     Breath sounds: Normal breath sounds.     Comments: Lungs clear to auscultation Musculoskeletal:        General: Normal range of motion.     Cervical back: Normal range of motion and neck  supple.  Skin:    General: Skin is warm and dry.  Neurological:     Mental Status: She is alert and oriented to person, place, and time.  Psychiatric:        Mood and Affect: Mood normal.        Behavior: Behavior normal.        Thought Content: Thought content normal.        Judgment: Judgment normal.    Diagnostics: Spirometry deferred  Assessment and Plan: 1. Not well controlled mild intermittent asthma with acute exacerbation   2. Allergic conjunctivitis of both eyes   3. Acute maxillary sinusitis, recurrence not specified   4. Cholinergic urticaria   5. 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 At increased risk of exposure to COVID-19 virus     Meds ordered this encounter  Medications   amoxicillin-clavulanate (AUGMENTIN) 875-125 MG tablet    Sig: Take 1 tablet by mouth 2 (two) times daily.    Dispense:  20 tablet    Refill:  0    Patient Instructions  Asthma Begin prednisone 10 mg tablets. Take 2 tablets twice a day for 3 days, then take 2 tablets once a day for 1 day, then take 1 tablet on the 5th day, then stop Continue Symbicort 160-2 puffs twice a day to prevent cough or wheeze  Continue Spiriva 1.25 mcg - 2 puffs once a day to prevent cough or wheeze Continue montelukast 10 mg once a day to prevent cough or wheeze Continue albuterol 2 puffs every 4 hours as needed for cough or wheeze OR Instead use albuterol 0.083% solution via nebulizer one unit vial every 4 hours as needed for cough or wheeze You may use albuterol 2 puffs 5 to 15 minutes before activity to decrease cough or wheeze Consider a biologic therapy to help control your asthma symptoms. Call the clinic if you are interested in this.   Acute sinusitis Begin Augmentin 875 mg twice a day for 10 days Begin saline nasal rinses and continue Flonase with the new administration technique Begin prednisone as above  Allergic rhinitis Continue allergen avoidance measures directed toward cockroach as listed below Begin cetirizine 10 mg once a day as needed for runny nose or itch. This will replace Xyzal Continue Flonase 2 sprays in each nostril once a day as needed for runny nose. In the right nostril, point the applicator out toward the right ear. In the left nostril, point the applicator out toward the left ear Begin saline nasal rinses as needed for nasal symptoms. Use this before any medicated nasal sprays for best result  Cholinergic urticaria Continue cetirizine as listed above  Food allergy Continue to avoid  shellfish. In case of an allergic reaction, take Benadryl 50 mg every 4 hours, and if life-threatening symptoms occur, inject with EpiPen 0.3 mg. When your breathing is back to baseline, make an appointment for a food challenge to shellfish.  Drug allergy Continue to avoid acetaminophen (Tylenol) and products containing acetaminophen  COVID exposure Get a COVID test. Call the clinic with the result  Call the clinic if this treatment plan is not working well for you.  Follow up in 4 weeks or sooner if needed.   Return in about 4 weeks (around 03/31/2021), or if symptoms worsen or fail to improve.    Thank you for the opportunity to care for this patient.  Please do not hesitate  to contact me with questions.  Thermon Leyland, FNP [facility]

## 2021-03-03 NOTE — Patient Instructions (Addendum)
Asthma Begin prednisone 10 mg tablets. Take 2 tablets twice a day for 3 days, then take 2 tablets once a day for 1 day, then take 1 tablet on the 5th day, then stop Continue Symbicort 160-2 puffs twice a day to prevent cough or wheeze  Continue Spiriva 1.25 mcg - 2 puffs once a day to prevent cough or wheeze Continue montelukast 10 mg once a day to prevent cough or wheeze Continue albuterol 2 puffs every 4 hours as needed for cough or wheeze OR Instead use albuterol 0.083% solution via nebulizer one unit vial every 4 hours as needed for cough or wheeze You may use albuterol 2 puffs 5 to 15 minutes before activity to decrease cough or wheeze Consider a biologic therapy to help control your asthma symptoms. Call the clinic if you are interested in this.   Acute sinusitis Begin Augmentin 875 mg twice a day for 10 days Begin saline nasal rinses and continue Flonase with the new administration technique Begin prednisone as above  Allergic rhinitis Continue allergen avoidance measures directed toward cockroach as listed below Begin cetirizine 10 mg once a day as needed for runny nose or itch. This will replace Xyzal Continue Flonase 2 sprays in each nostril once a day as needed for runny nose. In the right nostril, point the applicator out toward the right ear. In the left nostril, point the applicator out toward the left ear Begin saline nasal rinses as needed for nasal symptoms. Use this before any medicated nasal sprays for best result  Cholinergic urticaria Continue cetirizine as listed above  Food allergy Continue to avoid shellfish. In case of an allergic reaction, take Benadryl 50 mg every 4 hours, and if life-threatening symptoms occur, inject with EpiPen 0.3 mg. When your breathing is back to baseline, make an appointment for a food challenge to shellfish.  Drug allergy Continue to avoid acetaminophen (Tylenol) and products containing acetaminophen  COVID exposure Get a COVID test.  Call the clinic with the result  Call the clinic if this treatment plan is not working well for you.  Follow up in 4 weeks or sooner if needed.  Control of Cockroach Allergen  Cockroach allergen has been identified as an important cause of acute attacks of asthma, especially in urban settings.  There are fifty-five species of cockroach that exist in the [place], however only three, the [place], [place] species produce allergen that can affect patients with Asthma.  Allergens can be obtained from fecal particles, egg casings and secretions from cockroaches.    Remove food sources. Reduce access to water. Seal access and entry points. Spray runways with 0.5-1% Diazinon or Chlorpyrifos Blow boric acid power under stoves and refrigerator. Place bait stations (hydramethylnon) at feeding sites.

## 2021-03-31 ENCOUNTER — Ambulatory Visit: Payer: Medicaid Other | Admitting: Family Medicine

## 2021-03-31 NOTE — Progress Notes (Deleted)
° °  [address] [place] [address] Dept: [phone]  FOLLOW UP NOTE  Patient ID: Dawn Haney, female    DOB: 1985/07/03  Age: 36 y.o. MRN: [id number] Date of Office Visit: 03/31/2021  Assessment  Chief Complaint: No chief complaint on file.  HPI Dawn Haney is a 36 year old female who was last seen in this clinic on 03/03/2021 by Thermon Leyland, for evaluation of asthma with acute exacerbation, acute sinusitis, allergic rhinitis, allergic conjunctivitis, cholinergic urticaria, drug allergy to Tylenol and related compounds, and food allergy to shellfish.  She last had environmental allergy lab work on 05/24/2019 which was positive to cockroach.  She had labs on 12/18/2020 that indicated IgE 114 and absolute eosinophils 0.4.  She last had food allergy testing via blood work which was low positive to shellfish on 05/24/2019.   Drug Allergies:  Allergies  Allergen Reactions   Shrimp [Shellfish Allergy] Itching   Tylenol [Acetaminophen] Rash    Physical Exam: There were no vitals taken for this visit.   Physical Exam  Diagnostics:    Assessment and Plan: No diagnosis found.  No orders of the defined types were placed in this encounter.   There are no Patient Instructions on file for this visit.  No follow-ups on file.    Thank you for the opportunity to care for this patient.  Please do not hesitate to contact me with questions.  Thermon Leyland, FNP [facility]

## 2021-03-31 NOTE — Patient Instructions (Incomplete)
Asthma Continue Symbicort 160-2 puffs twice a day to prevent cough or wheeze  Continue Spiriva 1.25 mcg - 2 puffs once a day to prevent cough or wheeze Continue montelukast 10 mg once a day to prevent cough or wheeze Continue albuterol 2 puffs every 4 hours as needed for cough or wheeze OR Instead use albuterol 0.083% solution via nebulizer one unit vial every 4 hours as needed for cough or wheeze You may use albuterol 2 puffs 5 to 15 minutes before activity to decrease cough or wheeze Consider a biologic therapy to help control your asthma symptoms. Call the clinic if you are interested in this.   Allergic rhinitis Continue allergen avoidance measures directed toward cockroach as listed below Begin cetirizine 10 mg once a day as needed for runny nose or itch. This will replace Xyzal Continue Flonase 2 sprays in each nostril once a day as needed for runny nose. In the right nostril, point the applicator out toward the right ear. In the left nostril, point the applicator out toward the left ear Begin saline nasal rinses as needed for nasal symptoms. Use this before any medicated nasal sprays for best result  Cholinergic urticaria Continue cetirizine as listed above  Food allergy Continue to avoid shellfish. In case of an allergic reaction, take Benadryl 50 mg every 4 hours, and if life-threatening symptoms occur, inject with EpiPen 0.3 mg. When your breathing is back to baseline, make an appointment for a food challenge to shellfish.  Drug allergy Continue to avoid acetaminophen (Tylenol) and products containing acetaminophen  Call the clinic if this treatment plan is not working well for you.  Follow up in 6 months or sooner if needed.  Control of Cockroach Allergen  Cockroach allergen has been identified as an important cause of acute attacks of asthma, especially in urban settings.  There are fifty-five species of cockroach that exist in the [place], however only three, the  [place], [place] species produce allergen that can affect patients with Asthma.  Allergens can be obtained from fecal particles, egg casings and secretions from cockroaches.    Remove food sources. Reduce access to water. Seal access and entry points. Spray runways with 0.5-1% Diazinon or Chlorpyrifos Blow boric acid power under stoves and refrigerator. Place bait stations (hydramethylnon) at feeding sites.

## 2021-05-12 ENCOUNTER — Other Ambulatory Visit: Payer: Self-pay

## 2021-05-12 ENCOUNTER — Encounter: Payer: Self-pay | Admitting: Family Medicine

## 2021-05-12 ENCOUNTER — Ambulatory Visit (INDEPENDENT_AMBULATORY_CARE_PROVIDER_SITE_OTHER): Payer: Medicaid Other | Admitting: Family Medicine

## 2021-05-12 VITALS — BP 100/60 | HR 86 | Temp 98.0°F | Resp 17 | Wt 121.9 lb

## 2021-05-12 DIAGNOSIS — L505 Cholinergic urticaria: Secondary | ICD-10-CM

## 2021-05-12 DIAGNOSIS — J4540 Moderate persistent asthma, uncomplicated: Secondary | ICD-10-CM

## 2021-05-12 DIAGNOSIS — T7800XA Anaphylactic reaction due to unspecified food, initial encounter: Secondary | ICD-10-CM

## 2021-05-12 DIAGNOSIS — J3089 Other allergic rhinitis: Secondary | ICD-10-CM

## 2021-05-12 DIAGNOSIS — K011 Impacted teeth: Secondary | ICD-10-CM | POA: Insufficient documentation

## 2021-05-12 DIAGNOSIS — R079 Chest pain, unspecified: Secondary | ICD-10-CM

## 2021-05-12 DIAGNOSIS — T50905D Adverse effect of unspecified drugs, medicaments and biological substances, subsequent encounter: Secondary | ICD-10-CM

## 2021-05-12 NOTE — Patient Instructions (Addendum)
Asthma ?Continue Symbicort 160-2 puffs twice a day to prevent cough or wheeze  ?Continue Spiriva 1.25 mcg - 2 puffs once a day to prevent cough or wheeze ?Continue albuterol 2 puffs every 4 hours as needed for cough or wheeze OR Instead use albuterol 0.083% solution via nebulizer one unit vial every 4 hours as needed for cough or wheeze. Only use this medication as needed. Do not use this medication on a regular basis  ?You may use albuterol 2 puffs 5 to 15 minutes before activity to decrease cough or wheeze ?Consider a biologic therapy to help control your asthma symptoms. Call the clinic if you are interested in this.  ? ?Allergic rhinitis ?Continue allergen avoidance measures directed toward cockroach as listed below ?Continue Xyzal 5 mg once a day as needed for runny nose or itch.  ?Continue Flonase 2 sprays in each nostril once a day as needed for runny nose. In the right nostril, point the applicator out toward the right ear. In the left nostril, point the applicator out toward the left ear ?Begin saline nasal rinses as needed for nasal symptoms. Use this before any medicated nasal sprays for best result ? ?Cholinergic urticaria ?Continue cetirizine as listed above ? ?Food allergy ?Continue to avoid shellfish. In case of an allergic reaction, take Benadryl 50 mg every 4 hours, and if life-threatening symptoms occur, inject with EpiPen 0.3 mg. ? ?Drug allergy ?Continue to avoid acetaminophen (Tylenol) and products containing acetaminophen ? ?Possible food impaction  ?Follow up with your dentist for further evaluation ? ?Chest pain ?Follow up with your primary care provider for further evaluation of intermittent chest pain as soon as possible.  ?Go to the emergency department with any further chest pain.  ? ?Call the clinic if this treatment plan is not working well for you. ? ?Follow up in 4 months or sooner if needed. ? ?Control of Cockroach Allergen ? ?Cockroach allergen has been identified as an important  cause of acute attacks of asthma, especially in urban settings.  There are fifty-five species of cockroach that exist in the [place], however only three, the [place], [place] species produce allergen that can affect patients with Asthma.  Allergens can be obtained from fecal particles, egg casings and secretions from cockroaches. ?   ?Remove food sources. ?Reduce access to water. ?Seal access and entry points. ?Spray runways with 0.5-1% Diazinon or Chlorpyrifos ?Blow boric acid power under stoves and refrigerator. ?Place bait stations (hydramethylnon) at feeding sites. ? ? ?

## 2021-05-12 NOTE — Progress Notes (Signed)
? ?[address] ?[place] [address] ?Dept: [phone] ? ?FOLLOW UP NOTE ? ?Patient ID: Dawn Haney, female    DOB: [birth year]  Age: 36 y.o. MRN: [id number] ?Date of Office Visit: 05/12/2021 ? ?Assessment  ?Chief Complaint: Follow-up and Asthma (Pt states that when she eats her jar-bone tends to pop out, also states that she express some occasional chest pinch/tightness. She not taking the singular anymore for about 2 months now.) ? ?HPI ?Dawn Haney is a 36 year old female who presents the clinic for follow-up visit.  She was last seen in this clinic on 03/03/2021 by Thermon Leyland, FNP, for evaluation of asthma, allergic rhinitis, urticaria, food allergy to shellfish, and allergy to acetaminophen.  At today's visit, she reports her asthma has been moderately well controlled with occasional shortness of breath and wheeze with vigorous activity.  She continues Symbicort 160-2 puffs twice a day, Spiriva 1.25 mcg 2 puffs once a day and uses albuterol twice a day on a regular basis.  She does not use albuterol for shortness of breath.  She stopped taking montelukast about 2 months ago in an effort to consolidate and reduce medications.  A review of previous testing from 12/18/2020 indicates absolute eosinophil count 0.4 and IgE 114.  She continues to verbalize that she is not interested in a biologic therapy to control her asthma symptoms at this time.  She reports intermittent chest tightness that began about 2 weeks ago and occurs every few days in the area of  the left midclavicular plane.  She reports this intermittent chest pain is not associated with movement or breathing and cannot be reproduced.  She denies radiating pain, diaphoresis, weakness, fatigue, fever, or heart palpitations.  She denies reflux and is not currently taking a medication to control reflux.  Allergic rhinitis is reported as well controlled with no symptoms at this time.  She continues Flonase, cetirizine, and nasal saline  rinses as needed with relief of symptoms. Chart review indicates environmental allergy lab testing from 05/24/2019 that was positive to cockroach.   She reports urticaria has been well controlled with no breakouts since her last visit to this clinic.  She continues to avoid shrimp and crab for the most part.  She does report that she occasionally eats small pieces of shrimp or crab which results in profuse itching.  She denies hives, cardiopulmonary, or gastrointestinal symptoms with this itch.  The itch resolves with no medical intervention after about an hour.  She does not have any epinephrine autoinjector pens at this time.  She reports that after eating any solid food she develops a firm area on her cheek on the left side.  She reports this feels uncomfortable, however, is not painful and resolves in 10 minutes.  She does not develop this firmness while drinking or eating food that is not solid.  She does report regular dental checkups.  She did miss a dentist appointment this morning.  Her current medications are listed in the chart. ? ? ?Drug Allergies:  ?Allergies  ?Allergen Reactions  ? Shrimp [Shellfish Allergy] Itching  ? Tylenol [Acetaminophen] Rash  ? ? ?Physical Exam: ?BP 100/60   Pulse 86   Temp 98 ?F (36.7 ?C) (Temporal)   Resp 17   Wt 121 lb 14.4 oz (55.3 kg)   SpO2 99%   BMI 23.81 kg/m?   ? ?Physical Exam ?Vitals reviewed.  ?Constitutional:   ?   Appearance: Normal appearance.  ?HENT:  ?   Head: Normocephalic and atraumatic.  ?  Right Ear: Tympanic membrane normal.  ?   Left Ear: Tympanic membrane normal.  ?   Nose:  ?   Comments: Bilateral nares edematous and pale with clear nasal drainage noted.  Pharynx normal.  Ears normal.  Eyes normal. ?   Mouth/Throat:  ?   Comments: Redness and swelling behind the molar on the upper jaw. ?Eyes:  ?   Conjunctiva/sclera: Conjunctivae normal.  ?Cardiovascular:  ?   Rate and Rhythm: Normal rate and regular rhythm.  ?   Heart sounds: Normal heart sounds.  No murmur heard. ?Pulmonary:  ?   Effort: Pulmonary effort is normal.  ?   Breath sounds: Normal breath sounds.  ?   Comments: Lungs clear to auscultation ?Musculoskeletal:     ?   General: Normal range of motion.  ?   Cervical back: Normal range of motion and neck supple.  ?Skin: ?   General: Skin is warm and dry.  ?Neurological:  ?   Mental Status: She is alert and oriented to person, place, and time.  ?Psychiatric:     ?   Mood and Affect: Mood normal.     ?   Behavior: Behavior normal.     ?   Thought Content: Thought content normal.     ?   Judgment: Judgment normal.  ? ? ?Diagnostics: ?FVC 2.11, FEV1 1.90.  Predicted FVC 2.99, predicted FEV1 2.54.  Spirometry indicates possible mild restriction.  This is consistent with previous spirometry readings. ? ?Assessment and Plan: ?1. Moderate persistent asthma without complication   ?2. Perennial allergic rhinitis   ?3. Cholinergic urticaria   ?4. Anaphylaxis due to food   ?5. Adverse effect of drug, subsequent encounter   ?6. Chest pain, unspecified type   ?7. Dental impaction   ? ? ?Patient Instructions  ?Asthma ?Continue Symbicort 160-2 puffs twice a day to prevent cough or wheeze  ?Continue Spiriva 1.25 mcg - 2 puffs once a day to prevent cough or wheeze ?Continue albuterol 2 puffs every 4 hours as needed for cough or wheeze OR Instead use albuterol 0.083% solution via nebulizer one unit vial every 4 hours as needed for cough or wheeze. Only use this medication as needed. Do not use this medication on a regular basis  ?You may use albuterol 2 puffs 5 to 15 minutes before activity to decrease cough or wheeze ?Consider a biologic therapy to help control your asthma symptoms. Call the clinic if you are interested in this.  ? ?Allergic rhinitis ?Continue allergen avoidance measures directed toward cockroach as listed below ?Continue Xyzal 5 mg once a day as needed for runny nose or itch.  ?Continue Flonase 2 sprays in each nostril once a day as needed for runny nose.  In the right nostril, point the applicator out toward the right ear. In the left nostril, point the applicator out toward the left ear ?Begin saline nasal rinses as needed for nasal symptoms. Use this before any medicated nasal sprays for best result ? ?Cholinergic urticaria ?Continue cetirizine as listed above ? ?Food allergy ?Continue to avoid shellfish. In case of an allergic reaction, take Benadryl 50 mg every 4 hours, and if life-threatening symptoms occur, inject with EpiPen 0.3 mg. ? ?Drug allergy ?Continue to avoid acetaminophen (Tylenol) and products containing acetaminophen ? ?Possible food impaction  ?Follow up with your dentist for further evaluation ? ?Chest pain ?Follow up with your primary care provider for further evaluation of intermittent chest pain as soon as possible.  ?Go to  the emergency department with any further chest pain.  ? ?Call the clinic if this treatment plan is not working well for you. ? ?Follow up in 4 months or sooner if needed. ? ? ?Return in about 4 months (around 09/11/2021), or if symptoms worsen or fail to improve. ?  ? ?Thank you for the opportunity to care for this patient.  Please do not hesitate to contact me with questions. ? ?Thermon LeylandAnne Benjamim Harnish, FNP ?[facility] ? ? ? ? ? ?

## 2021-06-18 ENCOUNTER — Ambulatory Visit: Payer: Medicaid Other | Admitting: Internal Medicine

## 2021-09-17 NOTE — Progress Notes (Deleted)
FOLLOW UP Date of Service/Encounter:  09/17/21   Subjective:  Dawn Haney (DOB: 08/20/1985) is a 36 y.o. female who [facility] in re-evaluation of the following: Asthma, allergic rhinitis, shellfish allergy, cholinergic urticaria History obtained from: chart review and {Persons; PED relatives w/patient:19415::"patient"}.  For Review, LV was on 05/12/2021 with Thermon Leyland, FNP seen for routine follow-up.  She reported that her asthma was controlled with Symbicort and Spiriva and that she had stopped taking montelukast 2 months ago.  FEV1 was 1.90 L at that visit, possible restriction noted consistent with previous spirometry's.  She reported eating shrimp on occasion and having intense pruritus which subsided within an hour without treatment.  She also reported sensation of mouth soreness while eating solid foods.  Reported regular dental checkups.  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   History of drug reaction to Tylenol.   Blood test on 12/18/2020 with total IgE 114 and 400 absolute eosinophil count.  Approved for Xolair, but not interested in biologic therapy. environmental allergy lab testing from 05/24/2019 that was positive to cockroach  Today presents for follow-up. ***  Allergies as of 09/20/2021       Reactions   Shrimp [shellfish Allergy] Itching   Tylenol [acetaminophen] Rash        Medication List        Accurate as of September 17, 2021  1:54 PM. If you have any questions, ask your nurse or doctor.          albuterol (2.5 MG/3ML) 0.083% nebulizer solution Commonly known as: PROVENTIL Take 3 mLs (2.5 mg total) by nebulization every 4 (four) hours as needed for wheezing or shortness of breath (coughing fits).   albuterol 108 (90 Base) MCG/ACT inhaler Commonly known as: ProAir HFA Inhale 2 puffs into the lungs every 4 (four) hours as needed for wheezing or  shortness of breath (coughing fits).   ProAir HFA 108 (90 Base) MCG/ACT inhaler Generic drug: albuterol Inhale 2 puffs into the lungs every 6 (six) hours as needed for wheezing or shortness of breath.   amoxicillin-clavulanate 875-125 MG tablet Commonly known as: Augmentin Take 1 tablet by mouth 2 (two) times daily.   budesonide-formoterol 160-4.5 MCG/ACT inhaler Commonly known as: Symbicort Inhale 2 puffs into the lungs 2 (two) times daily.   EPINEPHrine 0.3 mg/0.3 mL Soaj injection Commonly known as: EPI-PEN Inject into the muscle.   fluticasone 50 MCG/ACT nasal spray Commonly known as: Flonase Place 1 spray into both nostrils 2 (two) times daily as needed (nasal congestion).   levocetirizine 5 MG tablet Commonly known as: XYZAL Take 1 tablet (5 mg total) by mouth every evening.   montelukast 10 MG tablet Commonly known as: Singulair Take 1 tablet (10 mg total) by mouth at bedtime.   ProAir Digihaler 108 (90 Base) MCG/ACT Aepb Generic drug: Albuterol Sulfate (sensor) Inhale 2 puffs into the lungs every 4 (four) hours as needed.   Spiriva Respimat 1.25 MCG/ACT Aers Generic drug: Tiotropium Bromide Monohydrate Inhale 2 puffs into the lungs daily.       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   Placenta previa 03/07/2019   02/21/19: posterior previa 03/04/19: resolved   Previous preterm delivery, antepartum 01/14/2019   SVD at 31 weeks in 2012   Supervision of high risk pregnancy, antepartum 01/08/2019    Nursing Staff Provider Office Location   [facility] Dating   LMP Language    Karen/English  Anatomy US   Flu Vaccine   Given 04/11/19 Genetic Screen  NIPS: low risks   AFP:   First Screen:  Quad:   TDaP vaccine   Declined Hgb A1C or  GTT Early  Third trimester: normal 2hr GTT  Rhogam     LAB RESULTS  Feeding Plan  undecided Blood Type B/Positive/-- (11/09 1005)  Contraception  Nexplanon Antibody Linna Caprice   Past Surgical History:  Procedure Laterality Date   NO PAST SURGERIES     Otherwise, there have been no changes to her past medical history, surgical history, family history, or social history.  ROS: All others negative except as noted per HPI.   Objective:  There were no vitals taken for this visit. There is no height or weight on file to calculate BMI. Physical Exam: General Appearance:  Alert, cooperative, no distress, appears stated age  Head:  Normocephalic, without obvious abnormality, atraumatic  Eyes:  Conjunctiva clear, EOM's intact  Nose: Nares normal, {Blank multiple:19196:a:"***","hypertrophic turbinates","normal mucosa","no visible anterior polyps","septum midline"}  Throat: Lips, tongue normal; teeth and gums normal, {Blank multiple:19196:a:"***","normal posterior oropharynx","tonsils 2+","tonsils 3+","no tonsillar exudate","+ cobblestoning"}  Neck: Supple, symmetrical  Lungs:   {Blank multiple:19196:a:"***","clear to auscultation bilaterally","end-expiratory wheezing","wheezing throughout"}, Respirations unlabored, {Blank multiple:19196:a:"***","no coughing","intermittent dry coughing"}  Heart:  {Blank multiple:19196:a:"***","regular rate and rhythm","no murmur"}, Appears well perfused  Extremities: No edema  Skin: Skin color, texture, turgor normal, no rashes or lesions on visualized portions of skin  Neurologic: No gross deficits   Reviewed: ***  Spirometry:  Tracings reviewed. Her effort: {Blank single:19197::"Good reproducible efforts.","It was hard to get consistent efforts and there is a question as to whether  this reflects a maximal maneuver.","Poor effort, data can not be interpreted.","Variable effort-results affected.","decent for first attempt at spirometry."} FVC: ***L FEV1: ***L, ***% predicted FEV1/FVC ratio: ***% Interpretation: {Blank single:19197::"Spirometry consistent with mild obstructive disease","Spirometry consistent with moderate obstructive disease","Spirometry consistent with severe obstructive disease","Spirometry consistent with possible restrictive disease","Spirometry consistent with mixed obstructive and restrictive disease","Spirometry uninterpretable due to technique","Spirometry consistent with normal pattern","No overt abnormalities noted given today's efforts"}.  Please see scanned spirometry results for details.  Skin Testing: {Blank single:19197::"Select foods","Environmental allergy panel","Environmental allergy panel and select foods","Food allergy panel","None","Deferred due to recent antihistamines use","deferred due to recent reaction"}. ***Adequate positive and negative controls Results discussed with patient/family.   {Blank single:19197::"Allergy testing results were read and interpreted by myself, documented by clinical staff."," "}  Assessment/Plan   ***  Tonny Bollman, MD  [facility]

## 2021-09-20 ENCOUNTER — Ambulatory Visit: Payer: Medicaid Other | Admitting: Internal Medicine

## 2021-10-06 NOTE — Progress Notes (Unsigned)
FOLLOW UP Date of Service/Encounter:  10/07/21   Subjective:  Dawn Haney (DOB: [birth year]) is a 36 y.o. female who [facility] in re-evaluation of the following: Asthma, allergic rhinitis, shellfish allergy, cholinergic urticaria History obtained from: chart review and patient.   For Review, LV was on 05/12/2021 with Thermon Leyland, FNP seen for routine follow-up.  She reported that her asthma was controlled with Symbicort and Spiriva and that she had stopped taking montelukast 2 months ago.  FEV1 was 1.90 L at that visit, possible restriction noted consistent with previous spirometry's.   She reported eating shrimp on occasion and having intense pruritus which subsided within an hour without treatment.   She also reported sensation of mouth soreness while eating solid foods.  Reported regular dental checkups.   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   History of drug reaction to Tylenol.   Blood test on 12/18/2020 with total IgE 114 and 400 absolute eosinophil count.  Approved for Xolair, but not interested in biologic therapy. environmental allergy lab testing from 05/24/2019 that was positive to cockroach   Today presents for follow-up. Sometimes when she is exercising or cuts grasses, she needs her albuterol.  She is using rescue inhaler around 2-3 times per week.  Spiriva she only uses sometimes. She mostly uses rescue inhaler in setting of activity, not using prior to exercise.  Triggers: cold weather, exercise.  Continues to have hives with exercise or heat. She was not aware she could start cetirizine for this. Usually takes a shower and within an hour hives resolve.  She is not taking montelukast, Xyzal, or flonase regularly. Uses most during winter or when she feels cold. She has not had any accidental exposures to shellfish.  She has not had any accidental exposures to  tylenol.    Allergies as of 10/07/2021       Reactions   Shrimp [shellfish Allergy] Itching   Tylenol [acetaminophen] Rash        Medication List        Accurate as of October 07, 2021  4:44 PM. If you have any questions, ask your nurse or doctor.          STOP taking these medications    amoxicillin-clavulanate 875-125 MG tablet Commonly known as: Augmentin Stopped by: Verlee Monte, MD   ProAir Digihaler 108 [phone] Base) MCG/ACT Aepb Generic drug: Albuterol Sulfate (sensor) Stopped by: Verlee Monte, MD       TAKE these medications    albuterol (2.5 MG/3ML) 0.083% nebulizer solution Commonly known as: PROVENTIL Take 3 mLs (2.5 mg total) by nebulization every 4 (four) hours as needed for wheezing or shortness of breath (coughing fits).   ProAir HFA 108 (90 Base) MCG/ACT inhaler Generic drug: albuterol Inhale 2 puffs into the lungs every 6 (six) hours as needed for wheezing or shortness of breath.   albuterol 108 (90 Base) MCG/ACT inhaler Commonly known as: ProAir HFA Inhale 2 puffs into the lungs every 4 (four) hours as needed for wheezing or shortness of breath (coughing fits).   budesonide-formoterol 160-4.5 MCG/ACT inhaler Commonly known as: Symbicort Inhale 2 puffs into the lungs 2 (two) times daily.   EPINEPHrine 0.3 mg/0.3 mL Soaj injection Commonly known as: EPI-PEN Inject 0.3 mg into the muscle as needed for anaphylaxis. What changed:  how much to take when to take this reasons to take this Changed by: Roderic Scarce  Maurine Minister, MD   fluticasone 50 MCG/ACT nasal spray Commonly known as: Flonase Place 1 spray into both nostrils 2 (two) times daily as needed (nasal congestion).   levocetirizine 5 MG tablet Commonly known as: XYZAL Take 1 tablet (5 mg total) by mouth every evening.   montelukast 10 MG tablet Commonly known as: Singulair Take 1 tablet (10 mg total) by mouth at bedtime.   Spiriva Respimat 1.25 MCG/ACT Aers Generic drug: Tiotropium Bromide  Monohydrate Inhale 2 puffs into the lungs daily.       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   Placenta previa 03/07/2019   02/21/19: posterior previa 03/04/19: resolved   Previous preterm delivery, antepartum 01/14/2019   SVD at 31 weeks in 2012   Supervision of high risk pregnancy, antepartum 01/08/2019    Nursing Staff Provider Office Location  [facility] Dating   LMP Language    Karen/English  Anatomy US   Flu Vaccine   Given 04/11/19 Genetic Screen  NIPS: low risks   AFP:   First Screen:  Quad:   TDaP vaccine   Declined Hgb A1C or  GTT Early  Third trimester: normal 2hr GTT  Rhogam     LAB RESULTS  Feeding Plan  undecided Blood Type B/Positive/-- (11/09 1005)  Contraception  Nexplanon Antibody Linna Caprice   Past Surgical History:  Procedure Laterality Date   NO PAST SURGERIES     Otherwise, there have been no changes to her past medical history, surgical history, family history, or social history.  ROS: All others negative except as noted per HPI.   Objective:  BP (!) 90/58   Pulse 85   Temp 98.2 F (36.8 C) (Temporal)   Resp 20   SpO2 98%  There is no height or weight on file to calculate BMI. Physical Exam: General Appearance:  Alert, cooperative, no distress, appears stated age  Head:  Normocephalic, without obvious abnormality, atraumatic  Eyes:  Conjunctiva clear, EOM's intact  Nose: Nares normal, hypertrophic turbinates, normal mucosa, no visible anterior polyps, and septum midline  Throat: Lips, tongue normal; teeth and gums normal, normal posterior  oropharynx  Neck: Supple, symmetrical  Lungs:   clear to auscultation bilaterally and wheezing throughout, Respirations unlabored, no coughing  Heart:  regular rate and rhythm and no murmur, Appears well perfused  Extremities: No edema  Skin: Skin color, texture, turgor normal, no rashes or lesions on visualized portions of skin  Neurologic: No gross deficits   Spirometry:  Tracings reviewed. Her effort: Good reproducible efforts. FVC: 2.08L FEV1: 1.71L, 68% predicted FEV1/FVC ratio: 96% Interpretation:  nonobstructive ratio, moderate restriction, simlar with previous spirometry .  Please see scanned spirometry results for details.  Assessment/Plan   Asthma-controlled Continue Symbicort 160-2 puffs twice a day to prevent cough or wheeze  Restart Spiriva 1.25 mcg - 2 puffs once a day to prevent cough or wheeze Continue albuterol 2 puffs every 4 hours as needed for cough or wheeze OR Instead use albuterol 0.083% solution via nebulizer one unit vial every 4 hours as needed for cough or  wheeze. Only use this medication as needed. Do not use this medication on a regular basis  You may use albuterol 2 puffs 5 to 15 minutes before activity to decrease cough or wheeze If not controlled on the above, consider a biologic therapy to help control your asthma symptoms. Call the clinic if you are interested in this.   Allergic rhinitis-controlled Continue allergen avoidance measures directed toward cockroach as listed below Continue Xyzal 5 mg once a day as needed for runny nose or itch.  Continue Flonase 2 sprays in each nostril once a day as needed  saline nasal rinses as needed for nasal symptoms. Use this before any medicated nasal sprays for best result  Cholinergic urticaria-stable Continue cetirizine as listed above  Food allergy-stable Continue to avoid shellfish. In case of an allergic reaction, take Benadryl 50 mg every 4 hours, and if life-threatening symptoms occur, inject with  EpiPen 0.3 mg.  Drug allergy-stable Continue to avoid acetaminophen (Tylenol) and products containing acetaminophen  Call the clinic if this treatment plan is not working well for you.  Follow up in 6 months or sooner if needed.  Tonny Bollman, MD  [facility]

## 2021-10-07 ENCOUNTER — Ambulatory Visit (INDEPENDENT_AMBULATORY_CARE_PROVIDER_SITE_OTHER): Payer: Medicaid Other | Admitting: Internal Medicine

## 2021-10-07 ENCOUNTER — Encounter: Payer: Self-pay | Admitting: Internal Medicine

## 2021-10-07 VITALS — BP 90/58 | HR 85 | Temp 98.2°F | Resp 20

## 2021-10-07 DIAGNOSIS — T7800XA Anaphylactic reaction due to unspecified food, initial encounter: Secondary | ICD-10-CM

## 2021-10-07 DIAGNOSIS — L505 Cholinergic urticaria: Secondary | ICD-10-CM

## 2021-10-07 DIAGNOSIS — Z886 Allergy status to analgesic agent status: Secondary | ICD-10-CM

## 2021-10-07 DIAGNOSIS — J3089 Other allergic rhinitis: Secondary | ICD-10-CM | POA: Diagnosis not present

## 2021-10-07 DIAGNOSIS — J4540 Moderate persistent asthma, uncomplicated: Secondary | ICD-10-CM | POA: Diagnosis not present

## 2021-10-07 DIAGNOSIS — Z91013 Allergy to seafood: Secondary | ICD-10-CM | POA: Insufficient documentation

## 2021-10-07 MED ORDER — LEVOCETIRIZINE DIHYDROCHLORIDE 5 MG PO TABS: 5.0000 mg | ORAL_TABLET | Freq: Every evening | ORAL | 5 refills | Status: DC

## 2021-10-07 MED ORDER — ALBUTEROL SULFATE HFA 108 (90 BASE) MCG/ACT IN AERS
2.0000 | INHALATION_SPRAY | RESPIRATORY_TRACT | 1 refills | Status: AC | PRN
Start: 2021-10-07 — End: ?

## 2021-10-07 MED ORDER — BUDESONIDE-FORMOTEROL FUMARATE 160-4.5 MCG/ACT IN AERO: 2.0000 | INHALATION_SPRAY | Freq: Two times a day (BID) | RESPIRATORY_TRACT | 5 refills | Status: DC

## 2021-10-07 MED ORDER — FLUTICASONE PROPIONATE 50 MCG/ACT NA SUSP: 1.0000 | Freq: Two times a day (BID) | NASAL | 5 refills | Status: DC | PRN

## 2021-10-07 MED ORDER — SPIRIVA RESPIMAT 1.25 MCG/ACT IN AERS: 2.0000 | INHALATION_SPRAY | Freq: Every day | RESPIRATORY_TRACT | 5 refills | Status: DC

## 2021-10-07 MED ORDER — MONTELUKAST SODIUM 10 MG PO TABS: 10.0000 mg | ORAL_TABLET | Freq: Every day | ORAL | 5 refills | Status: AC

## 2021-10-07 MED ORDER — EPINEPHRINE 0.3 MG/0.3ML IJ SOAJ: 0.3000 mg | INTRAMUSCULAR | 1 refills | Status: DC | PRN

## 2021-10-07 NOTE — Patient Instructions (Addendum)
Asthma Continue Symbicort 160-2 puffs twice a day to prevent cough or wheeze  Restart Spiriva 1.25 mcg - 2 puffs once a day to prevent cough or wheeze Continue albuterol 2 puffs every 4 hours as needed for cough or wheeze OR Instead use albuterol 0.083% solution via nebulizer one unit vial every 4 hours as needed for cough or wheeze. Only use this medication as needed. Do not use this medication on a regular basis  You may use albuterol 2 puffs 5 to 15 minutes before activity to decrease cough or wheeze If not controlled on the above, consider a biologic therapy to help control your asthma symptoms. Call the clinic if you are interested in this.   Allergic rhinitis Continue allergen avoidance measures directed toward cockroach as listed below Continue Xyzal 5 mg once a day as needed for runny nose or itch.  Continue Flonase 2 sprays in each nostril once a day as needed  saline nasal rinses as needed for nasal symptoms. Use this before any medicated nasal sprays for best result  Cholinergic urticaria Continue cetirizine as listed above  Food allergy Continue to avoid shellfish. In case of an allergic reaction, take Benadryl 50 mg every 4 hours, and if life-threatening symptoms occur, inject with EpiPen 0.3 mg.  Drug allergy Continue to avoid acetaminophen (Tylenol) and products containing acetaminophen  Call the clinic if this treatment plan is not working well for you.  Follow up in 6 months or sooner if needed.  Control of Cockroach Allergen  Cockroach allergen has been identified as an important cause of acute attacks of asthma, especially in urban settings.  There are fifty-five species of cockroach that exist in the [place], however only three, the [place], [place] species produce allergen that can affect patients with Asthma.  Allergens can be obtained from fecal particles, egg casings and secretions from cockroaches.    Remove food sources. Reduce access to  water. Seal access and entry points. Spray runways with 0.5-1% Diazinon or Chlorpyrifos Blow boric acid power under stoves and refrigerator. Place bait stations (hydramethylnon) at feeding sites.

## 2022-03-24 ENCOUNTER — Telehealth: Payer: Self-pay | Admitting: Internal Medicine

## 2022-03-24 MED ORDER — VENTOLIN HFA 108 (90 BASE) MCG/ACT IN AERS: 2.0000 | INHALATION_SPRAY | RESPIRATORY_TRACT | 5 refills | Status: AC | PRN

## 2022-03-24 NOTE — Telephone Encounter (Signed)
Refill of venotlin sent to [facility]

## 2022-03-24 NOTE — Telephone Encounter (Signed)
Pt request refill for albuterol

## 2022-03-31 ENCOUNTER — Other Ambulatory Visit: Payer: Self-pay

## 2022-03-31 ENCOUNTER — Ambulatory Visit (INDEPENDENT_AMBULATORY_CARE_PROVIDER_SITE_OTHER): Payer: Medicaid Other | Admitting: Family

## 2022-03-31 ENCOUNTER — Encounter: Payer: Self-pay | Admitting: Family

## 2022-03-31 VITALS — BP 110/70 | HR 92 | Temp 98.7°F | Resp 20 | Ht 60.0 in | Wt 118.5 lb

## 2022-03-31 DIAGNOSIS — Z886 Allergy status to analgesic agent status: Secondary | ICD-10-CM

## 2022-03-31 DIAGNOSIS — J4541 Moderate persistent asthma with (acute) exacerbation: Secondary | ICD-10-CM

## 2022-03-31 DIAGNOSIS — J3089 Other allergic rhinitis: Secondary | ICD-10-CM | POA: Diagnosis not present

## 2022-03-31 DIAGNOSIS — R042 Hemoptysis: Secondary | ICD-10-CM | POA: Diagnosis not present

## 2022-03-31 DIAGNOSIS — T7800XA Anaphylactic reaction due to unspecified food, initial encounter: Secondary | ICD-10-CM

## 2022-03-31 DIAGNOSIS — L505 Cholinergic urticaria: Secondary | ICD-10-CM

## 2022-03-31 MED ORDER — BUDESONIDE-FORMOTEROL FUMARATE 160-4.5 MCG/ACT IN AERO: 2.0000 | INHALATION_SPRAY | Freq: Two times a day (BID) | RESPIRATORY_TRACT | 5 refills | Status: DC

## 2022-03-31 MED ORDER — PREDNISONE 10 MG PO TABS: ORAL_TABLET | ORAL | 0 refills | Status: AC

## 2022-03-31 MED ORDER — ALBUTEROL SULFATE (2.5 MG/3ML) 0.083% IN NEBU: 2.5000 mg | INHALATION_SOLUTION | RESPIRATORY_TRACT | 2 refills | Status: DC | PRN

## 2022-03-31 MED ORDER — LEVOCETIRIZINE DIHYDROCHLORIDE 5 MG PO TABS: 5.0000 mg | ORAL_TABLET | Freq: Every evening | ORAL | 5 refills | Status: AC

## 2022-03-31 MED ORDER — SPIRIVA RESPIMAT 1.25 MCG/ACT IN AERS: 2.0000 | INHALATION_SPRAY | Freq: Every day | RESPIRATORY_TRACT | 5 refills | Status: DC

## 2022-03-31 MED ORDER — FLUTICASONE PROPIONATE 50 MCG/ACT NA SUSP: 1.0000 | Freq: Two times a day (BID) | NASAL | 5 refills | Status: DC | PRN

## 2022-03-31 NOTE — Patient Instructions (Addendum)
Asthma We will order a STAT chest x-ray due to your productive cough with blood.  Will call you with results once they are back Start prednisone 10 mg taking 2 tablets twice a day for 3 days, then on the fourth day take 2 tablets in the morning, on the fifth day take 1 tablet and stop Continue Symbicort 160/4.5 mcg-2 puffs twice a day to prevent cough or wheeze  Continue Spiriva 1.25 mcg - 2 puffs once a day to prevent cough or wheeze Continue albuterol 2 puffs every 4 hours as needed for cough or wheeze OR Instead use albuterol 0.083% solution via nebulizer one unit vial every 4 hours as needed for cough or wheeze. Only use this medication as needed. Do not use this medication on a regular basis.  Will refill the albuterol for your nebulizer.  Recommend decreasing your albuterol use. You may use albuterol 2 puffs 5 to 15 minutes before activity to decrease cough or wheeze If not controlled on the above, consider a biologic therapy to help control your asthma symptoms. Call the clinic if you are interested in this.   Allergic rhinitis Continue allergen avoidance measures directed toward cockroach as listed below Continue Xyzal 5 mg or Zyrtec 10 mg once a day as needed for runny nose or itch.  Continue Flonase 2 sprays in each nostril once a day as needed  saline nasal rinses as needed for nasal symptoms. Use this before any medicated nasal sprays for best result  Cholinergic urticaria Continue Xyzal or Zyrtec 10 mg as listed above  Food allergy Continue to avoid shellfish and beef. In case of an allergic reaction, take Benadryl 50 mg every 4 hours, and if life-threatening symptoms occur, inject with EpiPen 0.3 mg.  Drug allergy Continue to avoid acetaminophen (Tylenol) and products containing acetaminophen  Recommend going to urgent care or your primary care physician to get tested for influenza due to symptoms and your son recently testing positive.  We do not have the capability of testing  you here.  If you continue to have dizziness or your other symptoms worsen recommend going to the emergency room. Call the clinic if this treatment plan is not working well for you.  Follow up in 2-3 months or sooner if needed.  Control of Cockroach Allergen  Cockroach allergen has been identified as an important cause of acute attacks of asthma, especially in urban settings.  There are fifty-five species of cockroach that exist in the [place], however only three, the [place], [place] species produce allergen that can affect patients with Asthma.  Allergens can be obtained from fecal particles, egg casings and secretions from cockroaches.    Remove food sources. Reduce access to water. Seal access and entry points. Spray runways with 0.5-1% Diazinon or Chlorpyrifos Blow boric acid power under stoves and refrigerator. Place bait stations (hydramethylnon) at feeding sites.

## 2022-03-31 NOTE — Progress Notes (Signed)
[place] [address] Dept: [phone]  FOLLOW UP NOTE  Patient ID: Dawn Haney, female    DOB: [birth year]  Age: 37 y.o. MRN: [id number] Date of Office Visit: 03/31/2022  Assessment  Chief Complaint: No chief complaint on file.  HPI Dawn Haney is a 37 year old female who presents today for an acute visit.  She was last seen on October 07, 2021 by Dr. Simona Huh for asthma, allergic rhinitis, cholinergic urticaria, food allergy, and drug allergy.  She denies any new diagnosis or surgery since her last office visit. She was offered an interpreter and she denies needing an interpreter.  Moderate persistent asthma: She reports a cough for almost 2 weeks.  Her cough is productive and she reports that she has seen a little bit of blood.  She also has wheezing and her chest hurts.  Her cough occurs at night too.  She also feels shortness of breath when coughing a lot.  She denies tightness in chest, fever, and chills.  She is not sure if her albuterol is helping.  She has been using her albuterol every 2-3 hours.  She continues to take Symbicort 160/4.5 mcg 2 puffs twice a day with spacer, montelukast 10 mg once a day, and Spiriva Respimat 1.25 mcg 2 puffs once a day. Since her last office visit she has not required any trips to the emergency room or urgent care due to breathing problems.  Allergic rhinitis: She reports for the past 3 to 4 days she has had rhinorrhea that is yellow with a little bit of blood.  She also has an itchy throat and nasal congestion.  She denies postnasal drip.  Her head also hurts.  She has tried taking NyQuil for the symptoms and they have not helped.  She continues to take Xyzal 5 mg once a day and uses Flonase nasal spray as needed.  When asked if she has been around anyone sick,she reports that her child has been sick and was diagnosed this Monday with influenza.  Cholinergic urticaria: She denies any hives with her last office visit.  She is  currently taking Xyzal 5 mg once a day.  Food allergy: She continues to avoid shellfish and beef.  She reports that shellfish and beef make her itchy.  She has not had to use her EpiPen since we last saw her.  Drug allergy: She continues to avoid acetaminophen (Tylenol) and products containing acetaminophen.   Drug Allergies:  Allergies  Allergen Reactions   Shrimp [Shellfish Allergy] Itching   Tylenol [Acetaminophen] Rash    Review of Systems: Review of Systems  Constitutional:  Negative for chills and fever.       Reports body aches when she coughs a lot  HENT:         Reports rhinorrhea for the past 3 to 4 days that have been yellow with a little bit of blood.  Also reports nasal congestion and itchy throat.  Denies postnasal drip.  Eyes:        Denies itchy watery eyes  Respiratory:  Positive for cough, hemoptysis, shortness of breath and wheezing.        Reports cough that can be productive.  She has seen a little blood in her sputum.  The cough has been ongoing for almost 2 weeks.  She also reports wheezing and shortness of breath when coughing a lot.  The cough does occur at night also.  She denies tightness in her chest.  Cardiovascular:  Negative for chest pain and palpitations.  Gastrointestinal:        Denies heartburn or reflux symptoms  Genitourinary:  Negative for frequency.  Skin:  Negative for itching and rash.  Neurological:  Positive for dizziness and headaches.     Physical Exam: BP 110/70 (BP Location: Left Arm, Patient Position: Sitting, Cuff Size: Normal)   Pulse 92   Temp 98.7 F (37.1 C) (Temporal)   Resp 20   Ht 5' (1.524 m)   Wt 118 lb 8 oz (53.8 kg)   SpO2 97%   BMI 23.14 kg/m    Physical Exam Constitutional:      Appearance: Normal appearance.  HENT:     Head: Normocephalic and atraumatic.     Comments: Pharynx normal, eyes normal, ears normal, nose: Bilateral lower turbinates mildly edematous with no drainage noted    Right Ear: Tympanic  membrane, ear canal and external ear normal.     Left Ear: Tympanic membrane, ear canal and external ear normal.     Mouth/Throat:     Mouth: Mucous membranes are moist.     Pharynx: Oropharynx is clear.  Eyes:     Conjunctiva/sclera: Conjunctivae normal.  Cardiovascular:     Rate and Rhythm: Regular rhythm.     Heart sounds: Normal heart sounds.  Pulmonary:     Effort: Pulmonary effort is normal.     Breath sounds: Normal breath sounds.     Comments: Lungs clear to auscultation Musculoskeletal:     Cervical back: Neck supple.  Skin:    General: Skin is warm.  Neurological:     Mental Status: She is alert and oriented to person, place, and time.  Psychiatric:        Mood and Affect: Mood normal.        Behavior: Behavior normal.        Thought Content: Thought content normal.        Judgment: Judgment normal.     Diagnostics: Spirometry not done today due to symptoms. Will get at next office visit.  Assessment and Plan: 1. Moderate persistent asthma with (acute) exacerbation   2. Cough with hemoptysis   3. Perennial allergic rhinitis   4. 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    Dispense:  75 mL    Refill:  2   predniSONE (DELTASONE) 10 MG tablet    Sig: Take 2 tablets twice a day for 3 days, then on the fourth day take 2 tablets in the morning, and on the fifth day take 1 tablet and stop    Dispense:  15 tablet    Refill:  0   budesonide-formoterol (SYMBICORT) 160-4.5 MCG/ACT inhaler    Sig: Inhale 2 puffs into the lungs 2 (two) times daily.    Dispense:  11 g    Refill:  5   Tiotropium Bromide Monohydrate (SPIRIVA RESPIMAT) 1.25 MCG/ACT AERS    Sig:  Inhale 2 puffs into the lungs daily.    Dispense:  4 g    Refill:  5   levocetirizine (XYZAL) 5 MG tablet    Sig: Take 1 tablet (5 mg total) by mouth every evening.  Dispense:  30 tablet    Refill:  5   fluticasone (FLONASE) 50 MCG/ACT nasal spray    Sig: Place 1 spray into both nostrils 2 (two) times daily as needed (nasal congestion).    Dispense:  16 g    Refill:  5    Patient Instructions  Asthma We will order a STAT chest x-ray due to your productive cough with blood.  Will call you with results once they are back Start prednisone 10 mg taking 2 tablets twice a day for 3 days, then on the fourth day take 2 tablets in the morning, on the fifth day take 1 tablet and stop Continue Symbicort 160/4.5 mcg-2 puffs twice a day to prevent cough or wheeze  Continue Spiriva 1.25 mcg - 2 puffs once a day to prevent cough or wheeze Continue albuterol 2 puffs every 4 hours as needed for cough or wheeze OR Instead use albuterol 0.083% solution via nebulizer one unit vial every 4 hours as needed for cough or wheeze. Only use this medication as needed. Do not use this medication on a regular basis.  Will refill the albuterol for your nebulizer.  Recommend decreasing your albuterol use. You may use albuterol 2 puffs 5 to 15 minutes before activity to decrease cough or wheeze If not controlled on the above, consider a biologic therapy to help control your asthma symptoms. Call the clinic if you are interested in this.   Allergic rhinitis Continue allergen avoidance measures directed toward cockroach as listed below Continue Xyzal 5 mg or Zyrtec 10 mg once a day as needed for runny nose or itch.  Continue Flonase 2 sprays in each nostril once a day as needed  saline nasal rinses as needed for nasal symptoms. Use this before any medicated nasal sprays for best result  Cholinergic urticaria Continue Xyzal or Zyrtec 10 mg as listed above  Food allergy Continue to avoid shellfish and beef. In case of  an allergic reaction, take Benadryl 50 mg every 4 hours, and if life-threatening symptoms occur, inject with EpiPen 0.3 mg.  Drug allergy Continue to avoid acetaminophen (Tylenol) and products containing acetaminophen  Recommend going to urgent care or your primary care physician to get tested for influenza due to symptoms and your son recently testing positive.  We do not have the capability of testing you here.  If you continue to have dizziness or your other symptoms worsen recommend going to the emergency room. Call the clinic if this treatment plan is not working well for you.  Follow up in 2-3 months or sooner if needed.  Control of Cockroach Allergen  Cockroach allergen has been identified as an important cause of acute attacks of asthma, especially in urban settings.  There are fifty-five species of cockroach that exist in the [place], however only three, the [place], [place] species produce allergen that can affect patients with Asthma.  Allergens can be obtained from fecal particles, egg casings and secretions from cockroaches.    Remove food sources. Reduce access to water. Seal access and entry points. Spray runways with 0.5-1% Diazinon or Chlorpyrifos Blow boric acid power under stoves and refrigerator. Place bait stations (hydramethylnon) at feeding sites.  Return in about 2 months (around 05/30/2022), or if symptoms worsen or fail to improve.    Thank you for the opportunity to care for this patient.  Please do not hesitate to contact me with questions.  Althea Charon, FNP [facility]

## 2022-04-01 ENCOUNTER — Other Ambulatory Visit (HOSPITAL_COMMUNITY): Payer: Self-pay

## 2022-04-04 ENCOUNTER — Telehealth: Payer: Self-pay

## 2022-04-04 ENCOUNTER — Other Ambulatory Visit (HOSPITAL_COMMUNITY): Payer: Self-pay

## 2022-04-04 NOTE — Telephone Encounter (Signed)
PA request received via CMM for Breyna 160-4.5MCG/ACT aerosol  PA not submitted due to test claim showing that Brand Symbicort is covered under the patients plan at this time.   Key: [facility]

## 2022-05-05 ENCOUNTER — Telehealth: Payer: Self-pay | Admitting: Family

## 2022-05-05 NOTE — Telephone Encounter (Signed)
Pt needs location for ct scan that is needed

## 2022-05-05 NOTE — Telephone Encounter (Signed)
I see a chest xray did you want a ct also?

## 2022-05-06 NOTE — Telephone Encounter (Signed)
No, I just ordered a STAT chest x-ray on 03/31/22.

## 2022-05-06 NOTE — Telephone Encounter (Signed)
Lm for pt to call us back about this matter

## 2022-12-27 ENCOUNTER — Other Ambulatory Visit: Payer: Self-pay | Admitting: Family

## 2023-11-01 NOTE — Patient Instructions (Incomplete)
 Asthma-not well controlled Re-start Symbicort  160/4.5 mcg-2 puffs twice a day with spacer to prevent cough or wheeze. Rinse mouth out after Re-start Spiriva  1.25 mcg - 2 puffs once a day to prevent cough or wheeze Continue albuterol  2 puffs every 4 hours as needed for cough or wheeze OR Instead use albuterol  0.083% solution via nebulizer one unit vial every 4 hours as needed for cough or wheeze. Only use this medication as needed. Do not use this medication on a regular basis.  Will refill the albuterol  for your nebulizer.  Recommend decreasing your albuterol  use. You may use albuterol  2 puffs 5 to 15 minutes before activity to decrease cough or wheeze If not controlled on the above, consider a biologic therapy to help control your asthma symptoms. Call the clinic if you are interested in this.   Asthma control goals:  Full participation in all desired activities (may need albuterol  before activity) Albuterol  use two time or less a week on average (not counting use with activity) Cough interfering with sleep two time or less a month Oral steroids no more than once a year No hospitalizations   Allergic rhinitis-controlled Continue allergen avoidance measures directed toward cockroach as listed below Continue Xyzal  5 mg or Zyrtec  10 mg once a day as needed for runny nose or itch.  Continue Flonase  2 sprays in each nostril once a day as needed  saline nasal rinses as needed for nasal symptoms. Use this before any medicated nasal sprays for best result Will refer to ENT for anosmia (loss of smell) for past year  Cholinergic urticaria-Controlled Continue Xyzal  or Zyrtec  10 mg as listed above  Food allergy Continue to avoid shellfish and beef. In case of an allergic reaction, take Zyrtec  (cetirizine ) 10  mg every 24 hours, and if life-threatening symptoms occur, inject with EpiPen  0.3 mg. Emergency action plan given and reviewed Refill EpiPen  sent Consider updating skin testing at your next  appointment.  You will need to be off all antihistamines 3 days prior to this appointment  Drug allergy Continue to avoid acetaminophen  (Tylenol ) and products containing acetaminophen    Call the clinic if this treatment plan is not working well for you.  Follow up in 6-8 weeks or sooner if needed.  Control of Cockroach Allergen  Cockroach allergen has been identified as an important cause of acute attacks of asthma, especially in urban settings.  There are fifty-five species of cockroach that exist in the [place] , however only three, the [place], [place] and [place] species produce allergen that can affect patients with Asthma.  Allergens can be obtained from fecal particles, egg casings and secretions from cockroaches.    Remove food sources. Reduce access to water. Seal access and entry points. Spray runways with 0.5-1% Diazinon or Chlorpyrifos Blow boric acid power under stoves and refrigerator. Place bait stations (hydramethylnon) at feeding sites.

## 2023-11-02 ENCOUNTER — Encounter: Payer: Self-pay | Admitting: Family

## 2023-11-02 ENCOUNTER — Ambulatory Visit (INDEPENDENT_AMBULATORY_CARE_PROVIDER_SITE_OTHER): Admitting: Family

## 2023-11-02 VITALS — BP 102/70 | HR 80 | Temp 97.2°F | Resp 20 | Ht 61.45 in | Wt 108.7 lb

## 2023-11-02 DIAGNOSIS — Z886 Allergy status to analgesic agent status: Secondary | ICD-10-CM | POA: Diagnosis not present

## 2023-11-02 DIAGNOSIS — J3089 Other allergic rhinitis: Secondary | ICD-10-CM

## 2023-11-02 DIAGNOSIS — R430 Anosmia: Secondary | ICD-10-CM

## 2023-11-02 DIAGNOSIS — J4540 Moderate persistent asthma, uncomplicated: Secondary | ICD-10-CM | POA: Diagnosis not present

## 2023-11-02 DIAGNOSIS — L505 Cholinergic urticaria: Secondary | ICD-10-CM | POA: Diagnosis not present

## 2023-11-02 MED ORDER — CETIRIZINE HCL 10 MG PO TABS: ORAL_TABLET | ORAL | 5 refills | Status: AC

## 2023-11-02 MED ORDER — BUDESONIDE-FORMOTEROL FUMARATE 160-4.5 MCG/ACT IN AERO: INHALATION_SPRAY | RESPIRATORY_TRACT | 5 refills | Status: AC

## 2023-11-02 MED ORDER — FLUTICASONE PROPIONATE 50 MCG/ACT NA SUSP: 1.0000 | Freq: Two times a day (BID) | NASAL | 5 refills | Status: AC | PRN

## 2023-11-02 MED ORDER — ALBUTEROL SULFATE HFA 108 (90 BASE) MCG/ACT IN AERS: INHALATION_SPRAY | RESPIRATORY_TRACT | 1 refills | Status: AC

## 2023-11-02 MED ORDER — EPINEPHRINE 0.3 MG/0.3ML IJ SOAJ: 0.3000 mg | INTRAMUSCULAR | 1 refills | Status: AC | PRN

## 2023-11-02 MED ORDER — SPIRIVA RESPIMAT 1.25 MCG/ACT IN AERS: 2.0000 | INHALATION_SPRAY | Freq: Every day | RESPIRATORY_TRACT | 5 refills | Status: AC

## 2023-11-02 NOTE — Progress Notes (Signed)
 [address] [place] [address] Dept: [phone]  FOLLOW UP NOTE  Patient ID: Dawn Haney, female    DOB: 07-Apr-1985  Age: 38 y.o. MRN: [id number] Date of Office Visit: 11/02/2023  Assessment  Chief Complaint: Follow-up  HPI Dawn Haney is a 38 year old female who presents today for follow-up of asthma, allergic rhinitis, food allergy, drug allergy, and cholinergic urticaria.  She was last seen on March 31, 2022.  She denies any new diagnosis since her last office visit.  She reports that she is going to have an upcoming surgery due to gallstones.  She declines an interpreter today.  Asthma: She has only been using Symbicort  160/4.5 mcg 3-4 times a week rather than the 2 puffs twice a day as recommended at her last office visit.  She is out of Spiriva  1.25 mcg 2 puffs once a day.  She does not have an albuterol  inhaler.  She reports shortness of breath that wakes her up at night.  She will also have shortness of breath depending on the weather.  She reports that this is worse in the winter.  She denies cough, wheeze, and tightness in chest.  She feels like her breathing is good right now.  Since her last office visit she has not required any systemic steroids or made any trips to the emergency room or urgent care due to breathing problems.  She did not ever get the chest x-ray that was ordered at her last office visit for hemoptysis.  Allergic rhinitis: She denies rhinorrhea, nasal congestion, and postnasal drip.  She reports for the past year that she has not been able to smell, but can taste.  She then reports if it is a really strong scent she can smell.  She is currently not taking any antihistamines and is out of fluticasone  nasal spray.  She has not been treated for any sinus infections since we last saw her.  She reports that she is not ever seen ear nose and throat.  Cholinergic urticaria: She reports that she has not had any hives since her last office  visit.  Food allergy: She continues to avoid shellfish without any accidental ingestion or use of her epinephrine  autoinjector device.  She reports that if she eats beef that has a little bit of red in it ,it causes her to be itchy in her mouth/throat.  Drug allergy: She continues to avoid acetaminophen  (Tylenol ) and products containing acetaminophen .   Drug Allergies:  Allergies  Allergen Reactions   Shrimp [Shellfish Allergy] Itching   Tylenol  [Acetaminophen ] Rash    Review of Systems: Negative except as per HPI   Physical Exam: BP 102/70 (BP Location: Left Arm, Patient Position: Sitting)   Pulse 80   Temp (!) 97.2 F (36.2 C) (Temporal)   Resp 20   Ht 5' 1.45 (1.561 m)   Wt 108 lb 11.2 oz (49.3 kg)   SpO2 100%   BMI 20.24 kg/m    Physical Exam Constitutional:      Appearance: Normal appearance.  HENT:     Head: Normocephalic and atraumatic.     Comments: Pharynx normal, eyes normal, ears normal, nose: Bilateral lower turbinates mildly edematous and slightly erythematous with clear drainage noted    Right Ear: Tympanic membrane, ear canal and external ear normal.     Left Ear: Tympanic membrane, ear canal and external ear normal.     Mouth/Throat:     Mouth: Mucous membranes are moist.     Pharynx:  Oropharynx is clear.  Cardiovascular:     Rate and Rhythm: Regular rhythm.     Heart sounds: Normal heart sounds.  Pulmonary:     Effort: Pulmonary effort is normal.     Breath sounds: Normal breath sounds.     Comments: Lungs clear to auscultation Musculoskeletal:     Cervical back: Neck supple.  Skin:    General: Skin is warm.  Neurological:     Mental Status: She is alert and oriented to person, place, and time.  Psychiatric:        Mood and Affect: Mood normal.        Behavior: Behavior normal.        Thought Content: Thought content normal.        Judgment: Judgment normal.     Diagnostics: FVC 2.36 L (71%), FEV1 1.90 L (68%), FEV1/FVC 0.81.   Spirometry indicates possible restrictive defect.  Assessment and Plan: 1. Not well controlled moderate persistent asthma   2. Perennial allergic rhinitis   3. Cholinergic urticaria   4. 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 Anosmia     No orders of the defined types were placed in this encounter.   Patient Instructions  Asthma-not well controlled Re-start Symbicort  160/4.5 mcg-2 puffs twice a day with spacer to prevent cough or wheeze. Rinse mouth out after Re-start Spiriva  1.25 mcg - 2 puffs once a day to prevent cough or wheeze Continue albuterol  2 puffs every 4 hours as needed for cough or wheeze OR Instead use albuterol  0.083% solution via nebulizer one unit vial every 4 hours as needed for cough or wheeze. Only use this medication as needed. Do not use this medication on a regular basis.  Will refill the albuterol  for your nebulizer.  Recommend decreasing your albuterol  use. You may use albuterol  2 puffs 5 to 15 minutes before activity to decrease cough or wheeze If not controlled on the above, consider a biologic therapy to help control your asthma symptoms. Call the clinic if you are interested in this.   Asthma control goals:  Full participation in all desired activities (may need albuterol  before activity) Albuterol  use two time or less a week on average (not counting use with activity) Cough interfering with sleep two time or less a month Oral steroids no more than once a year No hospitalizations   Allergic rhinitis-controlled Continue allergen avoidance measures directed toward cockroach as listed below Continue Xyzal  5 mg or Zyrtec  10 mg once a day as needed for runny nose or itch.  Continue Flonase  2 sprays in each nostril once a day as needed  saline nasal rinses as needed for nasal symptoms. Use this before any medicated nasal sprays for best result Will refer to ENT for anosmia (loss of smell) for past year  Cholinergic  urticaria-Controlled Continue Xyzal  or Zyrtec  10 mg as listed above  Food allergy Continue to avoid shellfish and beef. In case of an allergic reaction, take Zyrtec  (cetirizine ) 10  mg every 24 hours, and if life-threatening symptoms occur, inject with EpiPen  0.3 mg. Emergency action plan given and reviewed Refill EpiPen  sent Consider updating skin testing at your next appointment.  You will need to be off all antihistamines 3 days prior to this appointment  Drug allergy Continue to avoid acetaminophen  (Tylenol ) and products containing acetaminophen    Call the clinic if this treatment plan is not working well for you.  Follow up in 6-8 weeks or sooner if needed.  Control of Cockroach  Allergen  Cockroach allergen has been identified as an important cause of acute attacks of asthma, especially in urban settings.  There are fifty-five species of cockroach that exist in the [place] , however only three, the [place], [place] and [place] species produce allergen that can affect patients with Asthma.  Allergens can be obtained from fecal particles, egg casings and secretions from cockroaches.    Remove food sources. Reduce access to water. Seal access and entry points. Spray runways with 0.5-1% Diazinon or Chlorpyrifos Blow boric acid power under stoves and refrigerator. Place bait stations (hydramethylnon) at feeding sites.  Return in about 6 weeks (around 12/14/2023).    Thank you for the opportunity to care for this patient.  Please do not hesitate to contact me with questions.  Wanda Craze, FNP [facility] 

## 2024-01-09 ENCOUNTER — Telehealth: Payer: Self-pay | Admitting: Family

## 2024-01-09 NOTE — Telephone Encounter (Signed)
 [facility] closed out referral due to no contact.  They called 3 times with an interpreter.

## 2024-01-09 NOTE — Telephone Encounter (Signed)
 Thank you for trying.
